# Patient Record
Sex: Male | Born: 1970 | Hispanic: No | Marital: Single | Smoking: Former smoker
Health system: Southern US, Community
[De-identification: ages and names within clinical notes are randomized; demographics above are authoritative.]

## PROBLEM LIST (undated history)

## (undated) ENCOUNTER — Ambulatory Visit (HOSPITAL_COMMUNITY): Payer: 59

## (undated) DIAGNOSIS — Z8489 Family history of other specified conditions: Secondary | ICD-10-CM

## (undated) DIAGNOSIS — Z87820 Personal history of traumatic brain injury: Secondary | ICD-10-CM

## (undated) DIAGNOSIS — G629 Polyneuropathy, unspecified: Secondary | ICD-10-CM

## (undated) DIAGNOSIS — M479 Spondylosis, unspecified: Secondary | ICD-10-CM

## (undated) DIAGNOSIS — I10 Essential (primary) hypertension: Secondary | ICD-10-CM

## (undated) DIAGNOSIS — M47819 Spondylosis without myelopathy or radiculopathy, site unspecified: Secondary | ICD-10-CM

## (undated) DIAGNOSIS — Z973 Presence of spectacles and contact lenses: Secondary | ICD-10-CM

## (undated) HISTORY — PX: NO PAST SURGERIES: SHX2092

## (undated) HISTORY — PX: KNEE ARTHROSCOPY W/ ORIF: SUR98

---
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
## 2009-05-17 ENCOUNTER — Emergency Department (HOSPITAL_COMMUNITY): Admission: EM | Admit: 2009-05-17 | Discharge: 2009-05-18 | Payer: Self-pay | Admitting: Emergency Medicine

## 2009-12-20 ENCOUNTER — Observation Stay (HOSPITAL_COMMUNITY): Admission: EM | Admit: 2009-12-20 | Discharge: 2009-12-21 | Payer: Self-pay | Admitting: Emergency Medicine

## 2009-12-20 ENCOUNTER — Ambulatory Visit: Payer: Self-pay | Admitting: Cardiology

## 2009-12-21 ENCOUNTER — Encounter (INDEPENDENT_AMBULATORY_CARE_PROVIDER_SITE_OTHER): Payer: Self-pay | Admitting: Emergency Medicine

## 2011-01-30 LAB — DIFFERENTIAL
Basophils Relative: 0 % (ref 0–1)
Eosinophils Relative: 1 % (ref 0–5)
Lymphs Abs: 2.9 10*3/uL (ref 0.7–4.0)
Monocytes Absolute: 0.8 10*3/uL (ref 0.1–1.0)
Monocytes Relative: 9 % (ref 3–12)

## 2011-01-30 LAB — ETHANOL: Alcohol, Ethyl (B): <5 mg/dL (ref 0–10)

## 2011-01-30 LAB — CK TOTAL AND CKMB (NOT AT ARMC)
CK, MB: 3.7 ng/mL (ref 0.3–4.0)
CK, MB: 3.8 ng/mL (ref 0.3–4.0)
CK, MB: 4.1 ng/mL — ABNORMAL HIGH (ref 0.3–4.0)
Relative Index: 1 (ref 0.0–2.5)
Total CK: 391 U/L — ABNORMAL HIGH (ref 7–232)

## 2011-01-30 LAB — COMPREHENSIVE METABOLIC PANEL
ALT: 21 U/L (ref 0–53)
AST: 27 U/L (ref 0–37)
Albumin: 4.1 g/dL (ref 3.5–5.2)
Alkaline Phosphatase: 64 U/L (ref 39–117)
Calcium: 8.9 mg/dL (ref 8.4–10.5)
Chloride: 105 mEq/L (ref 96–112)
GFR calc Af Amer: >60 mL/min (ref >60)
Total Protein: 7.3 g/dL (ref 6.0–8.3)

## 2011-01-30 LAB — RAPID URINE DRUG SCREEN, HOSP PERFORMED
Opiates: NOT DETECTED
Tetrahydrocannabinol: NOT DETECTED

## 2011-01-30 LAB — POCT CARDIAC MARKERS
CKMB, poc: 3.3 ng/mL (ref 1.0–8.0)
Myoglobin, poc: 57.2 ng/mL (ref 12–200)
Myoglobin, poc: 71.3 ng/mL (ref 12–200)

## 2011-01-30 LAB — D-DIMER, QUANTITATIVE: D-Dimer, Quant: <0.22 ug/mL-FEU (ref 0.00–0.48)

## 2011-01-30 LAB — CBC
HCT: 41 % (ref 39.0–52.0)
Hemoglobin: 14.1 g/dL (ref 13.0–17.0)
MCHC: 34.5 g/dL (ref 30.0–36.0)
MCV: 96.8 fL (ref 78.0–100.0)
RBC: 4.24 MIL/uL (ref 4.22–5.81)
RDW: 14.5 % (ref 11.5–15.5)

## 2011-01-30 LAB — TROPONIN I: Troponin I: 0.01 ng/mL (ref 0.00–0.06)

## 2011-01-30 LAB — LIPASE, BLOOD: Lipase: 51 U/L (ref 11–59)

## 2012-07-20 ENCOUNTER — Encounter (HOSPITAL_COMMUNITY): Payer: Self-pay | Admitting: Family Medicine

## 2012-07-20 ENCOUNTER — Emergency Department (HOSPITAL_COMMUNITY): Payer: Self-pay

## 2012-07-20 ENCOUNTER — Emergency Department (HOSPITAL_COMMUNITY)
Admission: EM | Admit: 2012-07-20 | Discharge: 2012-07-20 | Disposition: A | Discharge status: Home / Self Care | Payer: Self-pay | Attending: Emergency Medicine | Admitting: Emergency Medicine

## 2012-07-20 DIAGNOSIS — Y92009 Unspecified place in unspecified non-institutional (private) residence as the place of occurrence of the external cause: Secondary | ICD-10-CM | POA: Insufficient documentation

## 2012-07-20 DIAGNOSIS — Y93E1 Activity, personal bathing and showering: Secondary | ICD-10-CM | POA: Insufficient documentation

## 2012-07-20 DIAGNOSIS — S0990XA Unspecified injury of head, initial encounter: Secondary | ICD-10-CM

## 2012-07-20 DIAGNOSIS — S060XAA Concussion with loss of consciousness status unknown, initial encounter: Secondary | ICD-10-CM | POA: Insufficient documentation

## 2012-07-20 DIAGNOSIS — W2209XA Striking against other stationary object, initial encounter: Secondary | ICD-10-CM | POA: Insufficient documentation

## 2012-07-20 DIAGNOSIS — S060X9A Concussion with loss of consciousness of unspecified duration, initial encounter: Secondary | ICD-10-CM | POA: Insufficient documentation

## 2012-07-20 MED ORDER — HYDROCODONE-ACETAMINOPHEN 5-325 MG PO TABS: ORAL_TABLET | ORAL | Status: AC

## 2012-07-20 NOTE — ED Provider Notes (Signed)
History     CSN: [id number]  Arrival date & time 07/20/12  1145   First MD Initiated Contact with Patient 07/20/12 1522      Chief Complaint  Patient presents with  . Head Injury    (Consider location/radiation/quality/duration/timing/severity/associated sxs/prior treatment) HPI Comments: Patient is a 41 year old African American male, not on any blood thinning medications, who presents approximately 18 hours after striking his head on a shelf while in the shower. Patient struck the forward part of the crown of his head. He initially cleaned wound with peroxide. He denies loss of consciousness. No weakness in arms or legs, trouble walking/talking. Patient states he felt fine last night. Upon waking this morning patient had headache behind his left eye with associated photophobia, nausea and vomiting, dizziness described as a sensation of motion. He states the symptoms are gradually improving but not gone. Patient took aspirin without relief. No other treatments prior to arrival. Nothing makes symptoms better or worse. Onset was acute.  Patient is a 41 y.o. male presenting with head injury. The history is provided by the patient.  Head Injury  The incident occurred 12 to 24 hours ago. He came to the ER via walk-in. The injury mechanism was a direct blow. There was no loss of consciousness. The volume of blood lost was minimal. The quality of the pain is described as dull. The pain is moderate. The pain has been constant since the injury. Associated symptoms include vomiting. Pertinent negatives include no numbness, no blurred vision, patient does not experience disorientation, no weakness and no memory loss. He has tried NSAIDs for the symptoms. The treatment provided no relief.    History reviewed. No pertinent past medical history.  History reviewed. No pertinent past surgical history.  History reviewed. No pertinent family history.  History  Substance Use Topics  . Smoking status:  Never Smoker   . Smokeless tobacco: Not on file  . Alcohol Use: No      Review of Systems  Constitutional: Negative for fatigue.  HENT: Negative for neck pain.   Eyes: Positive for photophobia. Negative for blurred vision, pain and visual disturbance.  Respiratory: Negative for shortness of breath.   Cardiovascular: Negative for chest pain.  Gastrointestinal: Positive for nausea and vomiting. Negative for abdominal pain.  Musculoskeletal: Negative for back pain and gait problem.  Skin: Negative for wound.  Neurological: Positive for dizziness and headaches. Negative for facial asymmetry, weakness, light-headedness and numbness.  Psychiatric/Behavioral: Negative for memory loss, confusion and decreased concentration.    Allergies  Review of patient's allergies indicates no known allergies.  Home Medications   Current Outpatient Rx  Name Route Sig Dispense Refill  . ASPIRIN 325 MG PO TABS Oral Take 325 mg by mouth once as needed. For head ache      BP 135/91  Pulse 110  Temp 98.2 F (36.8 C) (Oral)  Resp 16  SpO2 95%  Physical Exam  Nursing note and vitals reviewed. Constitutional: He is oriented to person, place, and time. He appears well-developed and well-nourished.  HENT:  Head: Normocephalic and atraumatic. Head is without raccoon's eyes and without Battle's sign.  Right Ear: Tympanic membrane, external ear and ear canal normal. No hemotympanum.  Left Ear: Tympanic membrane, external ear and ear canal normal. No hemotympanum.  Nose: Nose normal.  Mouth/Throat: Oropharynx is clear and moist.  Eyes: Conjunctivae, EOM and lids are normal. Pupils are equal, round, and reactive to light.       No visible hyphema,  patient holds L eyelid shut due to 'light sensitivity'. Mild horizontal nystagmus.   Neck: Normal range of motion. Neck supple.  Cardiovascular: Normal rate and regular rhythm.   Pulmonary/Chest: Effort normal and breath sounds normal.  Abdominal: Soft. There  is no tenderness.  Musculoskeletal: Normal range of motion.       Cervical back: He exhibits normal range of motion, no tenderness and no bony tenderness.       Thoracic back: He exhibits no tenderness and no bony tenderness.       Lumbar back: He exhibits no tenderness and no bony tenderness.  Neurological: He is alert and oriented to person, place, and time. He has normal strength and normal reflexes. No cranial nerve deficit or sensory deficit. He displays a negative Romberg sign. Coordination and gait normal. GCS eye subscore is 4. GCS verbal subscore is 5. GCS motor subscore is 6.  Skin: Skin is warm and dry.  Psychiatric: He has a normal mood and affect.    ED Course  Procedures (including critical care time)  Labs Reviewed - No data to display Ct Head Wo Contrast  07/20/2012  *RADIOLOGY REPORT*  Clinical Data: Recent head trauma.  Headache.  Nausea, vomiting and dizziness.  CT HEAD WITHOUT CONTRAST  Technique:  Contiguous axial images were obtained from the base of the skull through the vertex without contrast.  Comparison: None.  Findings: The brain has a normal appearance without evidence of malformation, atrophy, old or acute infarction, mass lesion, hemorrhage, hydrocephalus or extra-axial collection.  No skull fracture.  Sinuses, middle ears and mastoids are clear.  IMPRESSION: Normal head CT.   Original Report Authenticated By: Thomasenia Sales, M.D.      1. Concussion   2. Head injury     3:46 PM Patient seen and examined. CT reviewed by myself. Exam performed.    Vital signs reviewed and are as follows: Filed Vitals:   07/20/12 1147  BP: 135/91  Pulse: 110  Temp: 98.2 F (36.8 C)  Resp: 16   3:50 PM D/w Dr. Manus Gunning. Will treat pain and d/c to home with neuro referral if symptoms persist. Patient to rest for next several days. CT reviewed by myself.   4:09 PM  BP 136/90  Pulse 78  Temp 98.5 F (36.9 C) (Oral)  Resp 18  SpO2 97%  Patient counseled on use of  narcotic pain medications. Counseled not to combine these medications with others containing tylenol. Urged not to drink alcohol, drive, or perform any other activities that requires focus while taking these medications. The patient verbalizes understanding and agrees with the plan.  Patient counseled to return with worsening headache, trouble walking, trouble talking, weakness in his arms or legs, facial droop, numbness or tingling in his arms or legs. Patient urged followup with neurology if not improved in one week. Counseled on possibility of post concussive syndrome. And patient appears well at time of discharge.  MDM  Patient with head injury, worsening symptoms this morning. CT is negative. Patient has signs and symptoms of concussion. Patient counseled on signs and symptoms to return. Neurology referral given if patient has persistent concussion symptoms.        Renne Crigler, [place] 07/20/12 [phone]

## 2012-07-20 NOTE — ED Provider Notes (Signed)
Medical screening examination/treatment/procedure(s) were conducted as a shared visit with non-physician practitioner(s) and myself.  I personally evaluated the patient during the encounter  Head injury in shower yesterday. No LOC.  No syncope. Now with headache, nausea, dizziness.  CN 2-12 intact, 5/5 strength throughout.  Glynn Octave, MD 07/20/12 1810

## 2012-07-20 NOTE — ED Notes (Signed)
Called pt to desk for VS.  No answer.

## 2012-07-20 NOTE — ED Notes (Signed)
Pt sts he hit head last night on his counter in the bathroom when getting out of shower. sts went to bed last night and woke up this am with N,V, HA and dizziness.

## 2015-12-25 DIAGNOSIS — M5430 Sciatica, unspecified side: Secondary | ICD-10-CM | POA: Insufficient documentation

## 2016-01-05 ENCOUNTER — Emergency Department (HOSPITAL_COMMUNITY): Payer: BLUE CROSS/BLUE SHIELD

## 2016-01-05 ENCOUNTER — Encounter (HOSPITAL_COMMUNITY): Payer: Self-pay | Admitting: Emergency Medicine

## 2016-01-05 ENCOUNTER — Emergency Department (HOSPITAL_COMMUNITY)
Admission: EM | Admit: 2016-01-05 | Discharge: 2016-01-05 | Disposition: A | Discharge status: Home / Self Care | Payer: BLUE CROSS/BLUE SHIELD | Attending: Emergency Medicine | Admitting: Emergency Medicine

## 2016-01-05 DIAGNOSIS — N12 Tubulo-interstitial nephritis, not specified as acute or chronic: Secondary | ICD-10-CM | POA: Diagnosis not present

## 2016-01-05 DIAGNOSIS — I10 Essential (primary) hypertension: Secondary | ICD-10-CM | POA: Insufficient documentation

## 2016-01-05 DIAGNOSIS — R109 Unspecified abdominal pain: Secondary | ICD-10-CM

## 2016-01-05 DIAGNOSIS — R1012 Left upper quadrant pain: Secondary | ICD-10-CM

## 2016-01-05 HISTORY — DX: Essential (primary) hypertension: I10

## 2016-01-05 LAB — BASIC METABOLIC PANEL
Anion gap: 13 (ref 5–15)
BUN: 11 mg/dL (ref 6–20)
CALCIUM: 10 mg/dL (ref 8.9–10.3)
CO2: 24 mmol/L (ref 22–32)
Chloride: 103 mmol/L (ref 101–111)
Creatinine, Ser: 1.28 mg/dL — ABNORMAL HIGH (ref 0.61–1.24)
GFR calc Af Amer: >60 mL/min (ref >60)
GLUCOSE: 111 mg/dL — AB (ref 65–99)
POTASSIUM: 4.5 mmol/L (ref 3.5–5.1)
Sodium: 140 mmol/L (ref 135–145)

## 2016-01-05 LAB — URINALYSIS, ROUTINE W REFLEX MICROSCOPIC
Bilirubin Urine: NEGATIVE
GLUCOSE, UA: NEGATIVE mg/dL
Hgb urine dipstick: NEGATIVE
Ketones, ur: NEGATIVE mg/dL
NITRITE: NEGATIVE
PH: 6 (ref 5.0–8.0)
Protein, ur: NEGATIVE mg/dL
SPECIFIC GRAVITY, URINE: 1.023 (ref 1.005–1.030)

## 2016-01-05 LAB — URINE MICROSCOPIC-ADD ON: RBC / HPF: NONE SEEN RBC/hpf (ref 0–5)

## 2016-01-05 LAB — HEPATIC FUNCTION PANEL
ALT: 37 U/L (ref 17–63)
AST: 36 U/L (ref 15–41)
Albumin: 4.7 g/dL (ref 3.5–5.0)
Alkaline Phosphatase: 69 U/L (ref 38–126)
Bilirubin, Direct: 0.1 mg/dL (ref 0.1–0.5)
Indirect Bilirubin: 0.7 mg/dL (ref 0.3–0.9)
Total Bilirubin: 0.8 mg/dL (ref 0.3–1.2)
Total Protein: 8 g/dL (ref 6.5–8.1)

## 2016-01-05 LAB — CBC
HEMATOCRIT: 44.2 % (ref 39.0–52.0)
Hemoglobin: 14.6 g/dL (ref 13.0–17.0)
MCH: 30.5 pg (ref 26.0–34.0)
MCHC: 33 g/dL (ref 30.0–36.0)
MCV: 92.5 fL (ref 78.0–100.0)
PLATELETS: 279 10*3/uL (ref 150–400)
RBC: 4.78 MIL/uL (ref 4.22–5.81)
RDW: 13.3 % (ref 11.5–15.5)
WBC: 8.4 10*3/uL (ref 4.0–10.5)

## 2016-01-05 LAB — LIPASE, BLOOD: Lipase: 45 U/L (ref 11–51)

## 2016-01-05 LAB — I-STAT TROPONIN, ED: Troponin i, poc: 0 ng/mL (ref 0.00–0.08)

## 2016-01-05 MED ORDER — MORPHINE SULFATE (PF) 4 MG/ML IV SOLN
4.0000 mg | Freq: Once | INTRAVENOUS | Status: DC
  Filled 2016-01-05: qty 1

## 2016-01-05 MED ORDER — IOHEXOL 300 MG/ML  SOLN
100.0000 mL | Freq: Once | INTRAMUSCULAR | Status: AC | PRN
  Administered 2016-01-05: 100 mL via INTRAVENOUS

## 2016-01-05 MED ORDER — DEXTROSE 5 % IV SOLN
1.0000 g | Freq: Once | INTRAVENOUS | Status: AC
  Administered 2016-01-05: 1 g via INTRAVENOUS
  Filled 2016-01-05: qty 10

## 2016-01-05 MED ORDER — MORPHINE SULFATE (PF) 4 MG/ML IV SOLN
4.0000 mg | Freq: Once | INTRAVENOUS | Status: AC
  Administered 2016-01-05: 4 mg via INTRAVENOUS
  Filled 2016-01-05: qty 1

## 2016-01-05 MED ORDER — SODIUM CHLORIDE 0.9 % IV BOLUS (SEPSIS)
1000.0000 mL | Freq: Once | INTRAVENOUS | Status: AC
  Administered 2016-01-05: 1000 mL via INTRAVENOUS

## 2016-01-05 MED ORDER — OXYCODONE-ACETAMINOPHEN 5-325 MG PO TABS
1.0000 | ORAL_TABLET | Freq: Once | ORAL | Status: AC
  Administered 2016-01-05: 1 via ORAL

## 2016-01-05 MED ORDER — OXYCODONE-ACETAMINOPHEN 5-325 MG PO TABS
ORAL_TABLET | ORAL | Status: AC
  Filled 2016-01-05: qty 1

## 2016-01-05 NOTE — ED Provider Notes (Signed)
CSN: [id number]     Arrival date & time 01/05/16  1211 History  By signing my name below, I, [name], attest that this documentation has been prepared under the direction and in the presence of Shawn Joy, PA-C. Electronically Signed: [name], ED Scribe. 01/05/2016. 7:01 PM.   Chief Complaint  Patient presents with  . Abdominal Pain   The history is provided by the patient. No language interpreter was used.   HPI Comments: Ernest Williams is a 45 y.o. male who presents to the Emergency Department complaining of intermittent left upper quadrant abdominal pain that radiates to left flank, and that has been present for 7 days but worsened today prompting ED evaluation. The pt states he believed the pain to be gas or GERD at onset but has been taking OTC antiacid medication without relief.  He describes this pain to be a spasm or like his chronic back pain, but to be unlike past muscle spasms experienced.The pt admits to a h/o chronic back pain for which he was prescribed robaxin, tramadol and a steroid for 4 days ago by his PCP. The robaxin has not provided relief of his current pain. Pt associates a decreased appetite, chills, diaphoresis, and an episode of very dark, almost black stool that occurred 1 day ago. His pain is exacerbated with deep breathing and on palpation. Pt denies hematochezia, fevers, pain with bowel movements, diarrhea, constipation, and urinary or GU symptoms. No cardiac or renal history.  Past Medical History  Diagnosis Date  . Hypertension    History reviewed. No pertinent past surgical history. No family history on file. Social History  Substance Use Topics  . Smoking status: Never Smoker   . Smokeless tobacco: None  . Alcohol Use: No    Review of Systems  Constitutional: Negative for fever.  Respiratory: Negative for cough and shortness of breath.   Gastrointestinal: Positive for abdominal pain. Negative for nausea, vomiting, diarrhea, constipation and  rectal pain.  Genitourinary: Negative for dysuria and hematuria.  All other systems reviewed and are negative.  Allergies  Review of patient's allergies indicates no known allergies.  Home Medications   Prior to Admission medications   Medication Sig Start Date End Date Taking? Authorizing Provider  aspirin 325 MG tablet Take 325 mg by mouth once as needed. For head ache    Historical Provider, MD   BP 118/85 mmHg  Pulse 88  Temp(Src) 98.3 F (36.8 C) (Oral)  Resp 16  Ht  (1.88 m)  Wt 266 lb 3.2 oz (120.748 kg)  BMI 34.16 kg/m2  SpO2 95% Physical Exam  Constitutional: He is oriented to person, place, and time. He appears well-developed and well-nourished. No distress.  HENT:  Head: Normocephalic.  Eyes: Conjunctivae are normal.  Neck: Normal range of motion. Neck supple.  Cardiovascular: Normal rate, regular rhythm, normal heart sounds and intact distal pulses.   No peripheral edema.   Pulmonary/Chest: Effort normal and breath sounds normal. No respiratory distress. He has no wheezes. He has no rales.  Abdominal: Soft. Bowel sounds are normal. There is tenderness in the left upper quadrant.  Musculoskeletal: Normal range of motion.  Tenderness to the left lower lumbar musculature.  Lymphadenopathy:    He has no cervical adenopathy.  Neurological: He is alert and oriented to person, place, and time. Coordination normal.  Skin: Skin is warm.  Psychiatric: He has a normal mood and affect. His behavior is normal.  Nursing note and vitals reviewed.   ED Course  Procedures  DIAGNOSTIC STUDIES: Oxygen Saturation is 95% on RA, adequate by my interpretation.    COORDINATION OF CARE: 5:11 PM Discussed treatment plan which includes to order symptomatic relief and IV fluids with pt. Discussed unremarkable cardiac workup obtained thus far. Will order Ct abdomen pelvis. Pt acknowledges and agrees to plan.   Labs Review Labs Reviewed  BASIC METABOLIC PANEL - Abnormal;  Notable for the following:    Glucose, Bld 111 (*)    Creatinine, Ser 1.28 (*)    All other components within normal limits  URINALYSIS, ROUTINE W REFLEX MICROSCOPIC (NOT AT [facility]) - Abnormal; Notable for the following:    APPearance CLOUDY (*)    Leukocytes, UA MODERATE (*)    All other components within normal limits  URINE MICROSCOPIC-ADD ON - Abnormal; Notable for the following:    Squamous Epithelial / LPF 0-5 (*)    Bacteria, UA FEW (*)    All other components within normal limits  URINE CULTURE  CBC  HEPATIC FUNCTION PANEL  LIPASE, BLOOD  I-STAT TROPOININ, ED  POC OCCULT BLOOD, ED  GC/CHLAMYDIA PROBE AMP ([place]) NOT AT [facility]    Imaging Review Dg Chest 2 View  01/05/2016  CLINICAL DATA:  Pt states left lower chest and left axillary rib pains x 2 weeks, no known injury or cough, no known htn or diabetes, no known heart disease, mk rt-r EXAM: CHEST - 2 VIEW COMPARISON:  12/20/2009 FINDINGS: Lungs are clear. Heart size and mediastinal contours are within normal limits. No effusion. Visualized skeletal structures are unremarkable. IMPRESSION: No acute cardiopulmonary disease. Electronically Signed   By: Corlis Leak M.D.   On: 01/05/2016 12:55   Ct Abdomen Pelvis W Contrast  01/05/2016  CLINICAL DATA:  Left upper quadrant pain EXAM: CT ABDOMEN AND PELVIS WITH CONTRAST TECHNIQUE: Multidetector CT imaging of the abdomen and pelvis was performed using the standard protocol following bolus administration of intravenous contrast. CONTRAST:  OMNIPAQUE IOHEXOL 300 MG/ML  SOLN COMPARISON:  None. FINDINGS: Lung bases are free of acute infiltrate or sizable effusion. The liver, gallbladder, spleen, adrenal glands and pancreas are within normal limits. The kidneys are well visualized bilaterally and demonstrate normal excretion of contrast. A small exophytic cyst is noted arising from the mid right kidney. The appendix is within normal limits. The bladder is partially distended. No free  pelvic fluid is seen. No pelvic mass lesion is noted. IMPRESSION: Small right renal cysts.  No acute abnormality is noted. Electronically Signed   By: Alcide Clever M.D.   On: 01/05/2016 18:53   I have personally reviewed and evaluated these images and lab results as part of my medical decision-making.   EKG Interpretation   Date/Time:  Friday January 05 2016 12:16:44 EST Ventricular Rate:  84 PR Interval:  134 QRS Duration: 90 QT Interval:  356 QTC Calculation: 420 R Axis:   11 Text Interpretation:  Normal sinus rhythm Minimal voltage criteria for  LVH, may be normal variant Borderline ECG Confirmed by Lincoln Brigham [phone])  on 01/05/2016 3:09:17 PM      MDM   Final diagnoses:  Flank pain  Left upper quadrant pain  Pyelonephritis    Ernest Williams presents with abdominal and lower rib pain for the past 7 days.  This patient's complaint and associated symptoms do not seem to fit any particular single diagnosis. Patient is nontoxic appearing, afebrile, not tachycardic, not tachypneic, maintains adequate SPO2 on room air, and is in no apparent distress. Patient has  no signs of sepsis or other serious or life-threatening condition. The patient's only lab abnormalities are moderate leukocytes on the UA and a minor increase in the patient's creatinine. Patient to be treated for possible pyelonephritis. IV Rocephin administered here in the ED. Patient voiced improvement after 2 courses of pain management. The patient was given instructions for home care as well as return precautions. Patient voices understanding of these instructions, accepts the plan, and is comfortable with discharge.  I personally performed the services described in this documentation, which was scribed in my presence. The recorded information has been reviewed and is accurate.   Anselm Pancoast, PA-C 01/05/16 2052  Tilden Fossa, MD 01/06/16 [phone]

## 2016-01-05 NOTE — ED Notes (Signed)
Chest pain started last Friday, in the L rib cage area. Pain is tender to palpation and deep breaths make it worse. Pt states the pain got worse today, was seen at [facility] for it and was given robaxin, steroid, tramadol, and antibiotics for it.

## 2016-01-05 NOTE — ED Notes (Signed)
Spoke with main lab, they will add on hepatic function panel and lipase.

## 2016-01-05 NOTE — Discharge Instructions (Signed)
You have been seen today for abdominal and flank pain. Your imaging showed no abnormalities. Your labs indicate that you may have an infection called pyelonephritis. You have been treated for this here in the ED. Follow up with PCP as soon as possible if symptoms do not resolve. Return to ED should symptoms worsen.   Emergency Department Resource Guide 1) Find a Doctor and Pay Out of Pocket Although you won't have to find out who is covered by your insurance plan, it is a good idea to ask around and get recommendations. You will then need to call the office and see if the doctor you have chosen will accept you as a new patient and what types of options they offer for patients who are self-pay. Some doctors offer discounts or will set up payment plans for their patients who do not have insurance, but you will need to ask so you aren't surprised when you get to your appointment.  2) Contact Your Local Health Department Not all health departments have doctors that can see patients for sick visits, but many do, so it is worth a call to see if yours does. If you don't know where your local health department is, you can check in your phone book. The CDC also has a tool to help you locate your state's health department, and many state websites also have listings of all of their local health departments.  3) Find a Walk-in Clinic If your illness is not likely to be very severe or complicated, you may want to try a walk in clinic. These are popping up all over the country in pharmacies, drugstores, and shopping centers. They're usually staffed by nurse practitioners or physician assistants that have been trained to treat common illnesses and complaints. They're usually fairly quick and inexpensive. However, if you have serious medical issues or chronic medical problems, these are probably not your best option.  No Primary Care Doctor: - Call Health Connect at  [phone] - they can help you locate a primary care  doctor that  accepts your insurance, provides certain services, etc. - Physician Referral Service- [phone]  Chronic Pain Problems: Organization         Address  Phone   Notes  [facility]  [phone] Patients need to be referred by their primary care doctor.   Medication Assistance: Organization         Address  Phone   Notes  [facility] Medication [facility] [address] [place] [place], [place] [address] [phone] --Must be a resident of [facility] -- Must have NO insurance coverage whatsoever (no Medicaid/ Medicare, etc.) -- The pt. MUST have a primary care doctor that directs their care regularly and follows them in the community   [facility]  [phone]   [employer]  [phone]    Agencies that provide inexpensive medical care: Organization         Address  Phone   Notes  [facility]  [phone]   [facility]    [phone]   [facility] [address] [place], [place] [address] [phone]   [facility] [address] [place], [place] [phone]   Planned Parenthood    [phone]   [facility]    [phone]   [facility]  [address], [place] Phone:  [phone], Fax:  [phone]  Hours of Operation:  9 am - 6 pm, M-F.  Also accepts Medicaid/Medicare and self-pay.  [facility] for [place], [address], [place] Phone: [phone], Fax: [phone]. Hours of Operation:  8:30 am - 5:30 pm, M-F.  Also accepts Medicaid and self-pay.  [facility] [place] [address] [place] Phone: [phone]   [place], [place], [place] [phone], Ext. [phone] Mondays & Thursdays: 7-9 AM.  First 15 patients are seen on a first come, first serve basis.    [place]  Providers:  Organization         Address  Phone   Notes  [facility] [address], [place] [phone] Also accepts self-pay patients.  [facility] [address] [place], [place]  [phone]   [place], [address], [place] [phone]   [facility] [address] [place] [phone]   Lucianne Lei [address], [place]   [phone] Only accepts [place] Access [place] patients after they have their name applied to their card.   Self-Pay (no insurance) in [facility]:  Organization         Address  Phone   Notes  Sickle Cell Patients, [facility] Internal Medicine [place] [phone]   [facility]gent Care [place] [phone]   [facility] [place]  [place], [place], [place] [phone]   [facility]/Dr. Osei-Bonsu  [address] [place] or [address], [place] [phone] Phone number for both [place] and [place] locations is the same.  [facility] [address] [place] [phone]   [facility] [address] [place] or [address] [phone]   [facility] [address], [place] [phone], phone; [phone], fax Sees patients 1st and 3rd Saturday of every month.  Must not qualify for public or private insurance (i.e. Medicaid, Medicare, [place] Health Choice, Veterans' Benefits)  Household income should be no more than 200% of the poverty level The clinic cannot treat you if you are pregnant or think you are pregnant  Sexually transmitted diseases are not treated at the clinic.    Dental Care: Organization         Address  Phone  Notes  [facility] Department of [facility] [place] [phone] Accepts children up to age 74 who are enrolled in [place] or [place]; pregnant women with a Medicaid card; and children who have applied for Medicaid or [place] Health Choice, but were declined, whose parents can pay a reduced fee at time of service.  [facility]  [address], [place] [phone] Accepts children up to age 32 who are enrolled in [place] or [place]; pregnant women with a Medicaid card; and children who have applied for Medicaid or [place] Health Choice, but were declined, whose parents can pay a reduced fee at time of service.  [facility]  [place] [phone] Patients are seen by appointment only. Walk-ins are not accepted. [place] will  see patients 5 years of age and older. Monday - Tuesday (8am-5pm) Most Wednesdays (8:30-5pm) $30 per visit, cash only  [facility] Access [facility]  [address], [facility] [phone] Patients are seen by appointment only. Walk-ins are not accepted. [place] will see patients 73 years of age and older. One Wednesday Evening (Monthly: Volunteer Based).  $30 per visit, cash only  [place]  [phone] for adults; Children under age 58, call Graduate Pediatric Dentistry at [phone]. Children aged 68-14, please call [phone] to request a pediatric application.  Dental services are provided in all areas of dental care including fillings, crowns and bridges, complete and partial dentures, implants, gum treatment, root canals, and extractions. Preventive care is also provided. Treatment is provided to both adults and children. Patients are selected via a lottery and there is often a waiting list.   [facility] [address] [place]  [phone] [email or web address]   [facility]  [address] [place], [place] [phone], Ext. [phone] Second and Fourth Thursday of each month, opens at 6:30 AM; Clinic ends at 9 AM.  Patients are seen on a first-come first-served basis, and a limited number are seen during each clinic.   [facility]  [address] Hillard Danker [place], [place] [phone]   Eligibility Requirements You must have lived in [place], [place], or [place] counties for at least the last three months.   You cannot be eligible for state or federal sponsored [employer], including [employer], [place], or [employer].   You generally cannot be eligible for healthcare insurance through your employer.    How to apply: Eligibility screenings are held every Tuesday and Wednesday afternoon from 1:00 pm until 4:00 pm. You do not need an appointment for the interview!  [facility] [address] [place], [place]   [place]  [place] Department  [place]  [phone]    Behavioral Health Resources in the Community: Intensive Outpatient Programs Organization         Address  Phone  Notes  [place]. [address] [place], [place] [phone]   [facility] Outpatient [address] [place], [place]   ADS: [facility] [address] [place], [place]   [place] [address]  [place], [place] or [phone]   Substance Abuse Resources Organization         Address  Phone  Notes  Alcohol and Drug Services  [phone]   [place]  [phone]   [facility] [place]   Chinita Pester  [phone]   Residential & Outpatient Substance Abuse Program  [phone]   Psychological Services Organization         Address  Phone  Notes  [facility]  [place]  [phone]   [place] [address] [place] or [phone]    Mobile Crisis Teams Organization         Address  Phone  Notes  [facility], Mobile Crisis Care Unit  [phone]   [facility]  [address] [place], [place]   [facility] [address] [place] [phone]    Self-Help/Support Groups Organization  Address  Phone             Notes  [place]. of [place] - variety of support groups  [place] Call for more information  Narcotics Anonymous (NA), [facility] [address], [employer] [place]  2 meetings at this location   Special educational needs teacher         Address  Phone  Notes  [facility] Residential Treatment [place],    [place]  [phone]   [facility]  [address], [place] [id number], [place], [place]   [place], [place] [phone] Admissions: 8am-3pm M-F  Incentives Substance [place] [address],    [place], [place] [phone]   [facility] [address] [place], [place], [place]   [facility] [address],  [place], [place]   [facility] Programs - Intensive Outpatient [address] Kristeen [address], [place], [place]   [facility] ([place]  [place], [place] [phone] or [phone]   [facility] Services (RTS) [address] [place], [place] Accepts Medicaid  [facility] [place] [address]  [place] [phone] Substance Abuse/Addiction Treatment   [facility] Organization         Address  Phone  Notes  [facility]  [phone]   Domenic Schwab, PhD [address] Arlis Porta [place], [place]   [phone] or [phone]    [place], [place] [phone]   [facility] [address], [place], [place] [phone] Insurance/Medicaid/sponsorship through [facility] and Families [address] [place]                                    [place], [place] [phone] [place] [address], [place] [phone]    Dr. Adele Schilder  [phone]   [facility] Dept. 1) [address], [place] 2) [place] 3)  [place] [address], [place] [phone]  [phone]   [place] [phone] or [phone] (After Hours)

## 2016-01-05 NOTE — ED Notes (Signed)
Pt placed in gown.

## 2016-01-07 LAB — URINE CULTURE: Culture: NO GROWTH

## 2016-01-08 LAB — GC/CHLAMYDIA PROBE AMP (~~LOC~~) NOT AT ARMC
Chlamydia: NEGATIVE
NEISSERIA GONORRHEA: NEGATIVE

## 2017-06-26 ENCOUNTER — Emergency Department (HOSPITAL_COMMUNITY)
Admission: EM | Admit: 2017-06-26 | Discharge: 2017-06-26 | Disposition: A | Discharge status: Home / Self Care | Payer: BLUE CROSS/BLUE SHIELD | Attending: Emergency Medicine | Admitting: Emergency Medicine

## 2017-06-26 ENCOUNTER — Emergency Department (HOSPITAL_COMMUNITY): Payer: BLUE CROSS/BLUE SHIELD

## 2017-06-26 ENCOUNTER — Encounter (HOSPITAL_COMMUNITY): Payer: Self-pay | Admitting: Emergency Medicine

## 2017-06-26 DIAGNOSIS — Y9389 Activity, other specified: Secondary | ICD-10-CM | POA: Diagnosis not present

## 2017-06-26 DIAGNOSIS — Y999 Unspecified external cause status: Secondary | ICD-10-CM | POA: Insufficient documentation

## 2017-06-26 DIAGNOSIS — X509XXA Other and unspecified overexertion or strenuous movements or postures, initial encounter: Secondary | ICD-10-CM | POA: Diagnosis not present

## 2017-06-26 DIAGNOSIS — S39012A Strain of muscle, fascia and tendon of lower back, initial encounter: Secondary | ICD-10-CM

## 2017-06-26 DIAGNOSIS — M545 Low back pain: Secondary | ICD-10-CM

## 2017-06-26 DIAGNOSIS — Z7982 Long term (current) use of aspirin: Secondary | ICD-10-CM | POA: Insufficient documentation

## 2017-06-26 DIAGNOSIS — I10 Essential (primary) hypertension: Secondary | ICD-10-CM | POA: Diagnosis not present

## 2017-06-26 DIAGNOSIS — Y929 Unspecified place or not applicable: Secondary | ICD-10-CM | POA: Insufficient documentation

## 2017-06-26 DIAGNOSIS — S3992XA Unspecified injury of lower back, initial encounter: Secondary | ICD-10-CM | POA: Diagnosis present

## 2017-06-26 MED ORDER — PREDNISONE 20 MG PO TABS
60.0000 mg | ORAL_TABLET | Freq: Once | ORAL | Status: AC
  Administered 2017-06-26: 60 mg via ORAL
  Filled 2017-06-26: qty 3

## 2017-06-26 MED ORDER — ONDANSETRON 4 MG PO TBDP
4.0000 mg | ORAL_TABLET | Freq: Once | ORAL | Status: AC
  Administered 2017-06-26: 4 mg via ORAL
  Filled 2017-06-26: qty 1

## 2017-06-26 MED ORDER — PREDNISONE 10 MG PO TABS: 20.0000 mg | ORAL_TABLET | Freq: Two times a day (BID) | ORAL | 0 refills | Status: AC

## 2017-06-26 MED ORDER — NAPROXEN 500 MG PO TABS: 500.0000 mg | ORAL_TABLET | Freq: Two times a day (BID) | ORAL | 0 refills | Status: DC

## 2017-06-26 MED ORDER — METHOCARBAMOL 500 MG PO TABS: 500.0000 mg | ORAL_TABLET | Freq: Two times a day (BID) | ORAL | 0 refills | Status: AC

## 2017-06-26 MED ORDER — MORPHINE SULFATE (PF) 4 MG/ML IV SOLN
6.0000 mg | Freq: Once | INTRAVENOUS | Status: AC
  Administered 2017-06-26: 6 mg via INTRAMUSCULAR
  Filled 2017-06-26: qty 2

## 2017-06-26 NOTE — ED Triage Notes (Signed)
Pt sts lower back pain with radiation to legs since Tuesday when at work

## 2017-06-26 NOTE — ED Notes (Signed)
Co sudden onset LBP with radiation to both legs and weakness to legs. States has hx of "sciatica...but not like this!"

## 2017-06-26 NOTE — Discharge Instructions (Signed)
Your xray's showed no signs of fracture, this is likely a muscle strain. Take Naprosyn twice daily as needed, do not combine with any pain medication other than tylenol. You may take Robaxin twice daily as needed for muscle spasms. Continue steroids for 5 days to improve inflammation.  It is very important that you return to the ED if you experience any of these symptoms:  if you are no longer able to go to the bathroom, or lose control of you bowels or bladder, if you have numbness throughout both legs, or are unable to walk.

## 2017-06-26 NOTE — ED Provider Notes (Signed)
[facility] Provider Note   CSN: [id number] Arrival date & time: 06/26/17  1428     History   Chief Complaint Chief Complaint  Patient presents with  . Back Pain    HPI Ernest Williams is a 46 y.o. male.  Ernest Williams is a 46 y.o. Male with a past medical history of hypertension and sciatica, presents for evaluation of lower back pain with pain radiating down both legs. Patient reports he has had a history of sciatica on left side, but states this is different, and more painful. Patient reports pain began Tuesday at work when lifting the hood of a car. He reports he felt a "pop" and then a shooting sensation down both legs. He reports as he was going to walk inside he fell, but did not hit his head or sustain additional injury. He reports his legs felt weak with a tingling sensation. He went home from work and tried to rest in bed yesterday put reports pain has not improved. He states his legs feel weak like he cannot lift them up due to pain.  Patient reports he has taken several doses of tylenol and motrin at home with no improvement. Patient reports he tried to go to work today and was able to ambulate. Patient reports he has been able to move his bowel and bladder and denies any episodes of incontinence. Denies saddle anesthesia. No history of IV drug abuse or hx of Cancer. He denies urinary symptoms.       Past Medical History:  Diagnosis Date  . Hypertension     There are no active problems to display for this patient.   History reviewed. No pertinent surgical history.     Home Medications    Prior to Admission medications   Medication Sig Start Date End Date Taking? Authorizing Provider  aspirin 325 MG tablet Take 325 mg by mouth once as needed. For head ache    [provider]    Family History History reviewed. No pertinent family history.  Social History Social History  Substance Use Topics  . Smoking status: Never Smoker  . Smokeless  tobacco: Not on file  . Alcohol use No     Allergies   Patient has no known allergies.   Review of Systems Review of Systems  Constitutional: Negative for chills and fever.  Eyes: Negative for visual disturbance.  Respiratory: Negative for chest tightness and shortness of breath.   Cardiovascular: Negative for chest pain and palpitations.  Gastrointestinal: Negative for abdominal distention, abdominal pain, constipation and diarrhea.  Genitourinary: Negative for difficulty urinating, dysuria and hematuria.  Musculoskeletal: Positive for back pain. Negative for gait problem.  Skin: Negative for rash and wound.  Neurological: Positive for weakness. Negative for dizziness and numbness.  All other systems reviewed and are negative.    Physical Exam Updated Vital Signs BP (!) 165/91 (BP Location: Right Arm)   Pulse 82   Temp 98 F (36.7 C) (Oral)   Resp 18   SpO2 97%   Physical Exam  Constitutional: He is oriented to person, place, and time. He appears well-developed and well-nourished. No distress.  HENT:  Head: Normocephalic and atraumatic.  Eyes: Pupils are equal, round, and reactive to light. Right eye exhibits no discharge. Left eye exhibits no discharge.  Cardiovascular: Normal rate, regular rhythm, normal heart sounds and intact distal pulses.   Bilateral dorsalis pedis and posterior tibials pulses are intact.   Pulmonary/Chest: Effort normal and breath sounds normal. No respiratory  distress.  Abdominal: Soft. Bowel sounds are normal. He exhibits no distension. There is no tenderness. There is no guarding.  Musculoskeletal:       Lumbar back: He exhibits decreased range of motion, pain and spasm. He exhibits no deformity.  No midline back TTP. No back erythema, deformity or ecchymosis.   Neurological: He is alert and oriented to person, place, and time. He displays normal reflexes. No sensory deficit. Coordination normal.  Decreased strength in bilateral lower  extremities, strength exam limited by pain  Skin: Skin is warm and dry. Capillary refill takes less than 2 seconds. No rash noted. He is not diaphoretic. No erythema. No pallor.  Psychiatric: He has a normal mood and affect. His behavior is normal.  Nursing note and vitals reviewed.    ED Treatments / Results  Labs (all labs ordered are listed, but only abnormal results are displayed) Labs Reviewed - No data to display  EKG  EKG Interpretation None       Radiology No results found.  Procedures Procedures (including critical care time)  Medications Ordered in ED Medications  ondansetron (ZOFRAN-ODT) disintegrating tablet 4 mg (not administered)  morphine 4 MG/ML injection 6 mg (not administered)  predniSONE (DELTASONE) tablet 60 mg (not administered)     Initial Impression / Assessment and Plan / ED Course  I have reviewed the triage vital signs and the nursing notes.  Pertinent labs & imaging results that were available during my care of the patient were reviewed by me and considered in my medical decision making (see chart for details).  3:15 PM Patient seen and evaluated, VSS presenting for evaluation of low back pain with pain radiating down both legs with some weakness- suspect due to pain. Denies numbness, bowel or bladder incontinence or saddle anesthesia, unlikely cauda equina syndrome. Will obtain plain film of lumbar spine. Morphine, Toradol and prednisone ordered for pain management.  5:15 PM Lumbar XR shows mild degenerative disk disease with no acute abnormalities. Discussed results with patient. Repeated exam after pain meds given, patient exhibited improved strength and mobility and reports improvement in pain. Most likely a muscle strain, will provide pain medication, muscle relaxer and short course of steroids. Discussed discharge with follow-up at orthopedics clinic. Strict return precautions provided  Final Clinical Impressions(s) / ED Diagnoses   Final  diagnoses:  Low back pain, unspecified back pain laterality, unspecified chronicity, with sciatica presence unspecified  Strain of lumbar region, initial encounter    New Prescriptions Discharge Medication List as of 06/26/2017  5:07 PM    START taking these medications   Details  methocarbamol (ROBAXIN) 500 MG tablet Take 1 tablet (500 mg total) by mouth 2 (two) times daily., Starting Thu 06/26/2017, Until Tue 07/01/2017, Print    naproxen (NAPROSYN) 500 MG tablet Take 1 tablet (500 mg total) by mouth 2 (two) times daily., Starting Thu 06/26/2017, Print    predniSONE (DELTASONE) 10 MG tablet Take 2 tablets (20 mg total) by mouth 2 (two) times daily with a meal., Starting Thu 06/26/2017, Until Tue 07/01/2017, Print         Dartha LodgeFord,  N, PA-C 06/26/17 2146    Little, Ambrose Finlandachel Morgan, MD 06/28/17 2121

## 2017-08-27 ENCOUNTER — Ambulatory Visit (HOSPITAL_COMMUNITY)
Admission: EM | Admit: 2017-08-27 | Discharge: 2017-08-27 | Disposition: A | Discharge status: Home / Self Care | Payer: BLUE CROSS/BLUE SHIELD | Attending: Family Medicine | Admitting: Family Medicine

## 2017-08-27 ENCOUNTER — Encounter (HOSPITAL_COMMUNITY): Payer: Self-pay | Admitting: Emergency Medicine

## 2017-08-27 DIAGNOSIS — M5432 Sciatica, left side: Secondary | ICD-10-CM | POA: Diagnosis not present

## 2017-08-27 DIAGNOSIS — W268XXA Contact with other sharp object(s), not elsewhere classified, initial encounter: Secondary | ICD-10-CM | POA: Diagnosis not present

## 2017-08-27 DIAGNOSIS — S61411A Laceration without foreign body of right hand, initial encounter: Secondary | ICD-10-CM

## 2017-08-27 MED ORDER — TRAMADOL HCL 50 MG PO TABS: 50.0000 mg | ORAL_TABLET | Freq: Four times a day (QID) | ORAL | 0 refills | Status: DC | PRN

## 2017-08-27 MED ORDER — CYCLOBENZAPRINE HCL 10 MG PO TABS: 10.0000 mg | ORAL_TABLET | Freq: Three times a day (TID) | ORAL | 0 refills | Status: DC

## 2017-08-27 NOTE — ED Triage Notes (Signed)
Pt states he cut the top of R hand with some metal shelving. Also c/o sciatica pain on the left side of his leg. Bleeding controlled, pt tetanus up to date.

## 2017-08-27 NOTE — ED Notes (Signed)
Dressing applied to right hand laceration

## 2017-08-27 NOTE — ED Provider Notes (Signed)
  [facility]   [id number] 08/27/17 Arrival Time: 1004  ASSESSMENT & PLAN:  1. Laceration of right hand without foreign body, initial encounter   2. Sciatica of left side     Meds ordered this encounter  Medications  . traMADol (ULTRAM) 50 MG tablet    Sig: Take 1 tablet (50 mg total) by mouth every 6 (six) hours as needed.    Dispense:  15 tablet    Refill:  0  . cyclobenzaprine (FLEXERIL) 10 MG tablet    Sig: Take 1 tablet (10 mg total) by mouth 3 (three) times daily.    Dispense:  20 tablet    Refill:  0   May f/u as needed. Work note given. Reviewed expectations re: course of current medical issues. Questions answered. Outlined signs and symptoms indicating need for more acute intervention. Patient verbalized understanding. After Visit Summary given.   SUBJECTIVE:  Phoebe Perchhillip Kross is a 46 y.o. male who presents with complaints of:  1) R hand laceration. Small. This morning. Cut on metal shelving. Back of R hand. Minimal bleeding, now controlled. Td UTD. No extremity sensation changes or weakness. Minimal discomfort. Cleaned under faucet at work.  2) Sciatica. Left. Recurring problem. Recent mild flare-up. OTC NSAIDs without much relief. Ambulatory. No extremity sensation changes or weakness. Certain movements exacerbate. Has used Flexeril/Ultram in past with good results. Normal bowel/bladder habits.  ROS: As per HPI.   OBJECTIVE:  Vitals:   08/27/17 1040  BP: (!) 156/104  Pulse: 84  Resp: 16  Temp: 98.6 F (37 C)  TempSrc: Oral  SpO2: 97%    General appearance: alert; no distress Lungs: clear to auscultation bilaterally Heart: regular rate and rhythm Back: no CVA tenderness SLR: mild pain in LLE with raise Extremities: R hand with <1cm superficial laceration dorsally; no active bleeding Skin: warm and dry Neurologic: normal gait; normal symmetric reflexes Psychological: alert and cooperative; normal mood and affect  No Known  Allergies  Past Medical History:  Diagnosis Date  . Hypertension    Social History   Social History  . Marital status: Single    Spouse name: N/A  . Number of children: N/A  . Years of education: N/A   Occupational History  . Not on file.   Social History Main Topics  . Smoking status: Never Smoker  . Smokeless tobacco: Not on file  . Alcohol use No  . Drug use: Unknown  . Sexual activity: Not on file   Other Topics Concern  . Not on file   Social History Narrative  . No narrative on file     Mardella LaymanHagler, , MD 08/27/17 1123

## 2017-08-27 NOTE — ED Notes (Signed)
Laceration washed with soap and water

## 2018-01-22 ENCOUNTER — Other Ambulatory Visit: Payer: Self-pay

## 2018-01-22 ENCOUNTER — Encounter (HOSPITAL_COMMUNITY): Payer: Self-pay | Admitting: Emergency Medicine

## 2018-01-22 ENCOUNTER — Emergency Department (HOSPITAL_COMMUNITY)
Admission: EM | Admit: 2018-01-22 | Discharge: 2018-01-22 | Disposition: A | Discharge status: Home / Self Care | Payer: BLUE CROSS/BLUE SHIELD | Attending: Emergency Medicine | Admitting: Emergency Medicine

## 2018-01-22 ENCOUNTER — Telehealth: Payer: Self-pay | Admitting: *Deleted

## 2018-01-22 ENCOUNTER — Emergency Department (HOSPITAL_COMMUNITY): Payer: BLUE CROSS/BLUE SHIELD

## 2018-01-22 DIAGNOSIS — Z7982 Long term (current) use of aspirin: Secondary | ICD-10-CM | POA: Insufficient documentation

## 2018-01-22 DIAGNOSIS — Z87891 Personal history of nicotine dependence: Secondary | ICD-10-CM | POA: Insufficient documentation

## 2018-01-22 DIAGNOSIS — I10 Essential (primary) hypertension: Secondary | ICD-10-CM | POA: Insufficient documentation

## 2018-01-22 DIAGNOSIS — Z79899 Other long term (current) drug therapy: Secondary | ICD-10-CM | POA: Diagnosis not present

## 2018-01-22 DIAGNOSIS — M25512 Pain in left shoulder: Secondary | ICD-10-CM | POA: Insufficient documentation

## 2018-01-22 LAB — CBC
HCT: 43.5 % (ref 39.0–52.0)
Hemoglobin: 14.8 g/dL (ref 13.0–17.0)
MCH: 31.5 pg (ref 26.0–34.0)
MCHC: 34 g/dL (ref 30.0–36.0)
MCV: 92.6 fL (ref 78.0–100.0)
Platelets: 288 10*3/uL (ref 150–400)
RBC: 4.7 MIL/uL (ref 4.22–5.81)
RDW: 13.5 % (ref 11.5–15.5)
WBC: 6.7 10*3/uL (ref 4.0–10.5)

## 2018-01-22 LAB — BASIC METABOLIC PANEL
Anion gap: 13 (ref 5–15)
BUN: 11 mg/dL (ref 6–20)
CO2: 21 mmol/L — ABNORMAL LOW (ref 22–32)
Calcium: 9.3 mg/dL (ref 8.9–10.3)
Chloride: 102 mmol/L (ref 101–111)
Creatinine, Ser: 1 mg/dL (ref 0.61–1.24)
GFR calc Af Amer: >60 mL/min (ref >60)
GFR calc non Af Amer: >60 mL/min (ref >60)
Glucose, Bld: 129 mg/dL — ABNORMAL HIGH (ref 65–99)
Potassium: 4.2 mmol/L (ref 3.5–5.1)
Sodium: 136 mmol/L (ref 135–145)

## 2018-01-22 LAB — I-STAT TROPONIN, ED: Troponin i, poc: 0 ng/mL (ref 0.00–0.08)

## 2018-01-22 MED ORDER — HYDROCODONE-ACETAMINOPHEN 5-325 MG PO TABS: 2.0000 | ORAL_TABLET | ORAL | 0 refills | Status: DC | PRN

## 2018-01-22 MED ORDER — HYDROMORPHONE HCL 1 MG/ML IJ SOLN
1.0000 mg | Freq: Once | INTRAMUSCULAR | Status: DC
  Filled 2018-01-22: qty 1

## 2018-01-22 MED ORDER — IBUPROFEN 400 MG PO TABS
600.0000 mg | ORAL_TABLET | Freq: Once | ORAL | Status: AC
  Administered 2018-01-22: 11:00:00 600 mg via ORAL
  Filled 2018-01-22: qty 1

## 2018-01-22 MED ORDER — DEXAMETHASONE 4 MG PO TABS
12.0000 mg | ORAL_TABLET | Freq: Once | ORAL | Status: AC
  Administered 2018-01-22: 12 mg via ORAL
  Filled 2018-01-22: qty 3

## 2018-01-22 MED ORDER — NAPROXEN 375 MG PO TABS: 375.0000 mg | ORAL_TABLET | Freq: Two times a day (BID) | ORAL | 0 refills | Status: DC

## 2018-01-22 MED ORDER — DEXAMETHASONE 4 MG PO TABS: 4.0000 mg | ORAL_TABLET | Freq: Two times a day (BID) | ORAL | 0 refills | Status: DC

## 2018-01-22 NOTE — Telephone Encounter (Signed)
Pharmacy called related to Rx: Norco 2 tablet every 4 hours PRN pain .Marland Kitchen.Marland Kitchen.EDCM clarified with EDP (Kohut) to change Rx to: 1 tablet every 4 hours for pain.

## 2018-01-22 NOTE — ED Notes (Signed)
Dr kohut at bedside 

## 2018-01-22 NOTE — ED Triage Notes (Signed)
Pt reports intermittent CP X1 mo worse within past week. States L sided with radiation into L neck/jaw. Also reports SOB. Pt appears to be in discomfort in triage.

## 2018-01-23 NOTE — ED Provider Notes (Signed)
[facility] Provider Note   CSN: [id number] Arrival date & time: 01/22/18  [id number]     History   Chief Complaint Chief Complaint  Patient presents with  . Chest Pain    HPI Ernest Williams is a 47 y.o. male.  HPI   47 year old male with left neck/left shoulder and left upper chest pain.  Symptom onset several weeks ago.  Initially intermittent.  Has progressively worsened and now fairly constant.  Worse with movement.  Radiates into his left upper arm.  No numbness or tingling.  Pain is also worse with certain movements of his neck.  Past Medical History:  Diagnosis Date  . Hypertension     There are no active problems to display for this patient.   History reviewed. No pertinent surgical history.     Home Medications    Prior to Admission medications   Medication Sig Start Date End Date Taking? Authorizing Provider  aspirin 325 MG tablet Take 325 mg by mouth once as needed. For head ache   Yes [provider]  cyclobenzaprine (FLEXERIL) 10 MG tablet Take 1 tablet (10 mg total) by mouth 3 (three) times daily. Patient not taking: Reported on 01/22/2018 08/27/17   Mardella LaymanHagler, Brian, MD  dexamethasone (DECADRON) 4 MG tablet Take 1 tablet (4 mg total) by mouth 2 (two) times daily. 01/22/18   Raeford RazorKohut, , MD  HYDROcodone-acetaminophen (NORCO/VICODIN) 5-325 MG tablet Take 2 tablets by mouth every 4 (four) hours as needed. 01/22/18   Raeford RazorKohut, , MD  ibuprofen (ADVIL,MOTRIN) 800 MG tablet Take 800 mg by mouth every 8 (eight) hours as needed.    [provider]  naproxen (NAPROSYN) 375 MG tablet Take 1 tablet (375 mg total) by mouth 2 (two) times daily. 01/22/18   Raeford RazorKohut, , MD  naproxen (NAPROSYN) 500 MG tablet Take 1 tablet (500 mg total) by mouth 2 (two) times daily. Patient not taking: Reported on 01/22/2018 06/26/17   Dartha LodgeFord, Kelsey N, PA-C  PENICILLIN V POTASSIUM PO Take by mouth.    [provider]  traMADol  (ULTRAM) 50 MG tablet Take 1 tablet (50 mg total) by mouth every 6 (six) hours as needed. Patient not taking: Reported on 01/22/2018 08/27/17   Mardella LaymanHagler, Brian, MD    Family History No family history on file.  Social History Social History   Tobacco Use  . Smoking status: Former Smoker    Types: Cigarettes    Last attempt to quit: 2017    Years since quitting: 2.2  . Smokeless tobacco: Never Used  Substance Use Topics  . Alcohol use: Yes    Comment: Socially  . Drug use: Yes    Types: Marijuana     Allergies   Patient has no known allergies.   Review of Systems Review of Systems  All systems reviewed and negative, other than as noted in HPI.  Physical Exam Updated Vital Signs BP (!) 117/98   Pulse 73   Temp 98 F (36.7 C) (Oral)   Resp (!) 21   Ht 6\' 2"  (1.88 m)   Wt 117.9 kg (260 lb)   SpO2 97%   BMI 33.38 kg/m   Physical Exam  Constitutional: He appears well-developed and well-nourished. No distress.  HENT:  Head: Normocephalic and atraumatic.  Eyes: Conjunctivae are normal. Right eye exhibits no discharge. Left eye exhibits no discharge.  Neck: Neck supple.  Cardiovascular: Normal rate, regular rhythm and normal heart sounds. Exam reveals no gallop and no friction  rub.  No murmur heard. Pulmonary/Chest: Effort normal and breath sounds normal. No respiratory distress.  Abdominal: Soft. He exhibits no distension. There is no tenderness.  Musculoskeletal: He exhibits no edema or tenderness.  Tenderness to palpation along the left lateral neck and upper trapezius.  No overlying skin is normal to inspection.  He has increased pain with neck flexion and rotation.  He also reports increased pain with abduction of the left shoulder.  Neurovascular intact distally.  Neurological: He is alert.  Skin: Skin is warm and dry.  Psychiatric: He has a normal mood and affect. His behavior is normal. Thought content normal.  Nursing note and vitals reviewed.    ED  Treatments / Results  Labs (all labs ordered are listed, but only abnormal results are displayed) Labs Reviewed  BASIC METABOLIC PANEL - Abnormal; Notable for the following components:      Result Value   CO2 21 (*)    Glucose, Bld 129 (*)    All other components within normal limits  CBC  I-STAT TROPONIN, ED    EKG  EKG Interpretation  Date/Time:  Thursday January 22 2018 06:36:54 EDT Ventricular Rate:  84 PR Interval:  128 QRS Duration: 88 QT Interval:  366 QTC Calculation: 432 R Axis:   43 Text Interpretation:  Normal sinus rhythm Normal ECG When compared with ECG of 01/05/2016, No significant change was found Confirmed by Dione Booze ([id number]) on 01/22/2018 7:42:12 AM       Radiology Dg Chest 2 View  Result Date: 01/22/2018 CLINICAL DATA:  Intermittent chest pain 1 month. EXAM: CHEST - 2 VIEW COMPARISON:  01/05/2016. FINDINGS: The heart size and mediastinal contours are within normal limits. Both lungs are clear. The visualized skeletal structures are unremarkable. IMPRESSION: No active cardiopulmonary disease.  Stable chest. Electronically Signed   By: Elsie Stain M.D.   On: 01/22/2018 07:36    Procedures Procedures (including critical care time)  Medications Ordered in ED Medications  dexamethasone (DECADRON) tablet 12 mg (12 mg Oral Given 01/22/18 1121)  ibuprofen (ADVIL,MOTRIN) tablet 600 mg (600 mg Oral Given 01/22/18 1121)     Initial Impression / Assessment and Plan / ED Course  I have reviewed the triage vital signs and the nursing notes.  Pertinent labs & imaging results that were available during my care of the patient were reviewed by me and considered in my medical decision making (see chart for details).     47 year old male with left neck/shoulder/upper chest pain.  I suspect that this may be a cervical radiculopathy.  Very atypical for ACS.  Doubt PE, dissection or other emergent process.  Plan symptomatic treatment.  Return precautions were  discussed.  Final Clinical Impressions(s) / ED Diagnoses   Final diagnoses:  Acute pain of left shoulder    ED Discharge Orders        Ordered    dexamethasone (DECADRON) 4 MG tablet  2 times daily     01/22/18 1051    HYDROcodone-acetaminophen (NORCO/VICODIN) 5-325 MG tablet  Every 4 hours PRN     01/22/18 1051    naproxen (NAPROSYN) 375 MG tablet  2 times daily     01/22/18 1051       Raeford Razor, MD 01/23/18 1023

## 2018-03-16 ENCOUNTER — Ambulatory Visit (HOSPITAL_COMMUNITY)
Admission: EM | Admit: 2018-03-16 | Discharge: 2018-03-16 | Disposition: A | Discharge status: Home / Self Care | Payer: BLUE CROSS/BLUE SHIELD | Attending: Family Medicine | Admitting: Family Medicine

## 2018-03-16 ENCOUNTER — Encounter (HOSPITAL_COMMUNITY): Payer: Self-pay | Admitting: Emergency Medicine

## 2018-03-16 ENCOUNTER — Other Ambulatory Visit: Payer: Self-pay

## 2018-03-16 DIAGNOSIS — M5412 Radiculopathy, cervical region: Secondary | ICD-10-CM | POA: Diagnosis not present

## 2018-03-16 MED ORDER — DICLOFENAC SODIUM 75 MG PO TBEC: 75.0000 mg | DELAYED_RELEASE_TABLET | Freq: Two times a day (BID) | ORAL | 0 refills | Status: DC

## 2018-03-16 MED ORDER — HYDROCODONE-ACETAMINOPHEN 5-325 MG PO TABS: 1.0000 | ORAL_TABLET | Freq: Four times a day (QID) | ORAL | 0 refills | Status: DC | PRN

## 2018-03-16 MED ORDER — PREDNISONE 10 MG (48) PO TBPK: ORAL_TABLET | ORAL | 0 refills | Status: DC

## 2018-03-16 NOTE — ED Triage Notes (Signed)
Pt has been suffering from left shoulder, back, neck and arm pain for two months.  Pt was seen in the ED in March and is not getting any better.  He reports numbness and tingling in the left arm and hand and pain in his spine, shoulder blade and neck.

## 2018-03-16 NOTE — ED Provider Notes (Signed)
[facility]   [id number] 03/16/18 Arrival Time: 1127  ASSESSMENT & PLAN:  1. Cervical radiculopathy    Meds ordered this encounter  Medications  . HYDROcodone-acetaminophen (NORCO/VICODIN) 5-325 MG tablet    Sig: Take 1 tablet by mouth every 6 (six) hours as needed for moderate pain or severe pain.    Dispense:  8 tablet    Refill:  0  . predniSONE (STERAPRED UNI-PAK 48 TAB) 10 MG (48) TBPK tablet    Sig: Take as directed.    Dispense:  48 tablet    Refill:  0  . diclofenac (VOLTAREN) 75 MG EC tablet    Sig: Take 1 tablet (75 mg total) by mouth 2 (two) times daily.    Dispense:  14 tablet    Refill:  0   [place] Controlled Substances Registry consulted for this patient. I feel the risk/benefit ratio today is favorable for proceeding with this prescription for a controlled substance. Medication sedation precautions given.  Work note given with light duty/lifting restrictions. To make f/u appt with an orthopaedist for further evaluation.  Reviewed expectations re: course of current medical issues. Questions answered. Outlined signs and symptoms indicating need for more acute intervention. Patient verbalized understanding. After Visit Summary given.  SUBJECTIVE: History from: patient. Ernest Williams is a 47 y.o. male who reports intermittent mild to moderate pain of his left neck mainly that radiates to his left shoulder and arm; described as sharp and shooting. H/O similar in the past that responded to steroids and pain medication. Onset: gradual, this episode over the past few days. Injury/trama: no, but lifts frequently. Relieved by: nothing in particular. Worsened by: certain movements. Associated symptoms: none reported. Extremity sensation changes or weakness: occasional numbness/tingling in LUE associated with pain. Self treatment: tried OTCs without relief of pain.  ROS: As per HPI.   OBJECTIVE:  Vitals:   03/16/18 1239  BP: 127/88  Pulse: 81  Temp:  98.6 F (37 C)  TempSrc: Oral  SpO2: 96%    General appearance: alert; no distress Extremities: warm and well perfused; symmetrical with no gross deformities; localized tenderness over his left neck that extends toward left shoulder with no swelling and no bruising; neck ROM: normal but with reported pain; no midline tenderness CV: normal extremity capillary refill Skin: warm and dry Neurologic: normal gait; normal symmetric reflexes in all extremities; normal sensation in all extremities at this time Psychological: alert and cooperative; normal mood and affect  No Known Allergies  Past Medical History:  Diagnosis Date  . Hypertension    Social History   Socioeconomic History  . Marital status: Single    Spouse name: Not on file  . Number of children: Not on file  . Years of education: Not on file  . Highest education level: Not on file  Occupational History  . Not on file  Social Needs  . Financial resource strain: Not on file  . Food insecurity:    Worry: Not on file    Inability: Not on file  . Transportation needs:    Medical: Not on file    Non-medical: Not on file  Tobacco Use  . Smoking status: Former Smoker    Types: Cigarettes    Last attempt to quit: 2017    Years since quitting: 2.3  . Smokeless tobacco: Never Used  Substance and Sexual Activity  . Alcohol use: Yes    Comment: Socially  . Drug use: Yes    Types: Marijuana  . Sexual  activity: Not on file  Lifestyle  . Physical activity:    Days per week: Not on file    Minutes per session: Not on file  . Stress: Not on file  Relationships  . Social connections:    Talks on phone: Not on file    Gets together: Not on file    Attends religious service: Not on file    Active member of club or organization: Not on file    Attends meetings of clubs or organizations: Not on file    Relationship status: Not on file  . Intimate partner violence:    Fear of current or ex partner: Not on file     Emotionally abused: Not on file    Physically abused: Not on file    Forced sexual activity: Not on file  Other Topics Concern  . Not on file  Social History Narrative  . Not on file   History reviewed. No pertinent family history. History reviewed. No pertinent surgical history.    Mardella Layman, MD 03/18/18 [phone]

## 2018-03-16 NOTE — Discharge Instructions (Signed)
Be aware, pain medications may cause drowsiness. Please do not drive, operate heavy machinery or make important decisions while on this medication, it can cloud your judgement.  

## 2018-05-24 ENCOUNTER — Emergency Department (HOSPITAL_COMMUNITY)
Admission: EM | Admit: 2018-05-24 | Discharge: 2018-05-24 | Disposition: A | Discharge status: Home / Self Care | Payer: BLUE CROSS/BLUE SHIELD | Attending: Emergency Medicine | Admitting: Emergency Medicine

## 2018-05-24 ENCOUNTER — Encounter (HOSPITAL_COMMUNITY): Payer: Self-pay | Admitting: Emergency Medicine

## 2018-05-24 ENCOUNTER — Emergency Department (HOSPITAL_COMMUNITY): Payer: BLUE CROSS/BLUE SHIELD

## 2018-05-24 DIAGNOSIS — R209 Unspecified disturbances of skin sensation: Secondary | ICD-10-CM | POA: Insufficient documentation

## 2018-05-24 DIAGNOSIS — Z87891 Personal history of nicotine dependence: Secondary | ICD-10-CM | POA: Diagnosis not present

## 2018-05-24 DIAGNOSIS — I10 Essential (primary) hypertension: Secondary | ICD-10-CM | POA: Insufficient documentation

## 2018-05-24 DIAGNOSIS — Z79899 Other long term (current) drug therapy: Secondary | ICD-10-CM | POA: Insufficient documentation

## 2018-05-24 DIAGNOSIS — R319 Hematuria, unspecified: Secondary | ICD-10-CM | POA: Diagnosis present

## 2018-05-24 DIAGNOSIS — IMO0001 Reserved for inherently not codable concepts without codable children: Secondary | ICD-10-CM

## 2018-05-24 LAB — URINALYSIS, ROUTINE W REFLEX MICROSCOPIC
Bacteria, UA: NONE SEEN
Bilirubin Urine: NEGATIVE
Glucose, UA: NEGATIVE mg/dL
Ketones, ur: NEGATIVE mg/dL
LEUKOCYTES UA: NEGATIVE
Nitrite: NEGATIVE
PROTEIN: NEGATIVE mg/dL
Specific Gravity, Urine: 1.001 — ABNORMAL LOW (ref 1.005–1.030)
pH: 7 (ref 5.0–8.0)

## 2018-05-24 LAB — CBC
HCT: 45.8 % (ref 39.0–52.0)
HEMOGLOBIN: 15 g/dL (ref 13.0–17.0)
MCH: 30.6 pg (ref 26.0–34.0)
MCHC: 32.8 g/dL (ref 30.0–36.0)
MCV: 93.5 fL (ref 78.0–100.0)
Platelets: 238 10*3/uL (ref 150–400)
RBC: 4.9 MIL/uL (ref 4.22–5.81)
RDW: 13.4 % (ref 11.5–15.5)
WBC: 8.5 10*3/uL (ref 4.0–10.5)

## 2018-05-24 LAB — BASIC METABOLIC PANEL
ANION GAP: 10 (ref 5–15)
BUN: 11 mg/dL (ref 6–20)
CO2: 22 mmol/L (ref 22–32)
Calcium: 9 mg/dL (ref 8.9–10.3)
Chloride: 104 mmol/L (ref 98–111)
Creatinine, Ser: 1.09 mg/dL (ref 0.61–1.24)
Glucose, Bld: 131 mg/dL — ABNORMAL HIGH (ref 70–99)
POTASSIUM: 4.1 mmol/L (ref 3.5–5.1)
SODIUM: 136 mmol/L (ref 135–145)

## 2018-05-24 MED ORDER — MORPHINE SULFATE (PF) 4 MG/ML IV SOLN
6.0000 mg | Freq: Once | INTRAVENOUS | Status: AC
  Administered 2018-05-24: 6 mg via INTRAVENOUS
  Filled 2018-05-24: qty 2

## 2018-05-24 MED ORDER — LORAZEPAM 2 MG/ML IJ SOLN
1.0000 mg | Freq: Once | INTRAMUSCULAR | Status: AC
  Administered 2018-05-24: 1 mg via INTRAVENOUS
  Filled 2018-05-24: qty 1

## 2018-05-24 MED ORDER — SODIUM CHLORIDE 0.9 % IV SOLN
INTRAVENOUS | Status: DC
  Administered 2018-05-24: 08:00:00 via INTRAVENOUS

## 2018-05-24 NOTE — ED Notes (Signed)
Patient given discharge instructions and verbalized understanding.  Patient stable to discharge at this time.  Patient is alert and oriented to baseline.  No distressed noted at this time.  All belongings taken with the patient at discharge.   
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
## 2018-05-24 NOTE — ED Notes (Signed)
Patient transported to CT 

## 2018-05-24 NOTE — ED Triage Notes (Addendum)
Pt here with multiple complaints, reports "my whole left side hurts" states pain has been ongoing x3 months, pt also states he saw blood when he urinated this am which is his main concern. Denies any falls or known injuries.

## 2018-05-25 LAB — URINE CULTURE: Culture: NO GROWTH

## 2018-07-29 ENCOUNTER — Ambulatory Visit (HOSPITAL_COMMUNITY)
Admission: EM | Admit: 2018-07-29 | Discharge: 2018-07-29 | Disposition: A | Discharge status: Home / Self Care | Payer: BLUE CROSS/BLUE SHIELD | Attending: Family Medicine | Admitting: Family Medicine

## 2018-07-29 ENCOUNTER — Encounter (HOSPITAL_COMMUNITY): Payer: Self-pay | Admitting: Family Medicine

## 2018-07-29 DIAGNOSIS — K0889 Other specified disorders of teeth and supporting structures: Secondary | ICD-10-CM | POA: Diagnosis not present

## 2018-07-29 MED ORDER — KETOROLAC TROMETHAMINE 60 MG/2ML IM SOLN
60.0000 mg | Freq: Once | INTRAMUSCULAR | Status: AC
  Administered 2018-07-29: 60 mg via INTRAMUSCULAR

## 2018-07-29 MED ORDER — KETOROLAC TROMETHAMINE 60 MG/2ML IM SOLN
INTRAMUSCULAR | Status: AC
  Filled 2018-07-29: qty 2

## 2018-07-29 MED ORDER — LISINOPRIL 20 MG PO TABS: 20.0000 mg | ORAL_TABLET | Freq: Every day | ORAL | 0 refills | Status: DC

## 2018-07-29 MED ORDER — PENICILLIN V POTASSIUM 500 MG PO TABS: 500.0000 mg | ORAL_TABLET | Freq: Four times a day (QID) | ORAL | 0 refills | Status: AC

## 2018-07-29 MED ORDER — TRAMADOL HCL 50 MG PO TABS: 50.0000 mg | ORAL_TABLET | Freq: Four times a day (QID) | ORAL | 0 refills | Status: DC | PRN

## 2018-07-29 NOTE — ED Triage Notes (Signed)
Pt presents with dental pain.

## 2018-07-29 NOTE — Discharge Instructions (Signed)
It was nice meeting you!!  Go ahead and treat you for dental infection I am giving you antibiotics and pain medication Toradol injection given in clinic Please follow-up with your dentist as scheduled.

## 2018-07-30 ENCOUNTER — Encounter (HOSPITAL_COMMUNITY): Payer: Self-pay | Admitting: Family Medicine

## 2018-07-30 NOTE — ED Provider Notes (Signed)
[facility]    CSN: [id number] Arrival date & time: 07/29/18  1454     History   Chief Complaint Chief Complaint  Patient presents with  . Dental Pain    HPI Ernest Williams is a 47 y.o. male.    Dental Pain  Location:  Lower Quality:  Aching, constant and throbbing Severity:  Severe Onset quality:  Gradual Duration:  2 days Timing:  Constant Progression:  Worsening Chronicity:  New Context: dental caries   Previous work-up:  Dental exam (has been to the dentist but was unable to have procedure done due to elevated BP) Relieved by:  Nothing Worsened by:  Cold food/drink, hot food/drink, touching and pressure Associated symptoms: no congestion, no difficulty swallowing, no drooling, no facial pain, no facial swelling, no fever, no gum swelling, no headaches, no neck pain, no neck swelling, no oral bleeding, no oral lesions and no trismus     Past Medical History:  Diagnosis Date  . Hypertension     There are no active problems to display for this patient.   History reviewed. No pertinent surgical history.     Home Medications    Prior to Admission medications   Medication Sig Start Date End Date Taking? Authorizing Provider  aspirin 325 MG tablet Take 325 mg by mouth daily. For head ache     [provider]  cyclobenzaprine (FLEXERIL) 10 MG tablet Take 1 tablet (10 mg total) by mouth 3 (three) times daily. 08/27/17   Mardella LaymanHagler, Brian, MD  diclofenac (VOLTAREN) 75 MG EC tablet Take 1 tablet (75 mg total) by mouth 2 (two) times daily. Patient not taking: Reported on 05/24/2018 03/16/18   Mardella LaymanHagler, Brian, MD  diphenhydramine-acetaminophen (TYLENOL PM) 25-500 MG TABS tablet Take 5 tablets by mouth at bedtime as needed.    [provider]  ibuprofen (ADVIL,MOTRIN) 800 MG tablet Take 800 mg by mouth every 8 (eight) hours as needed.    [provider]  lisinopril (PRINIVIL,ZESTRIL) 20 MG tablet Take 1 tablet (20 mg total) by mouth daily.  07/29/18   Dahlia ByesBast,  A, NP  naproxen (NAPROSYN) 375 MG tablet Take 1 tablet (375 mg total) by mouth 2 (two) times daily. Patient not taking: Reported on 05/24/2018 01/22/18   Raeford RazorKohut, Stephen, MD  naproxen (NAPROSYN) 500 MG tablet Take 1 tablet (500 mg total) by mouth 2 (two) times daily. 06/26/17   Dartha LodgeFord, Kelsey N, PA-C  penicillin v potassium (VEETID) 500 MG tablet Take 1 tablet (500 mg total) by mouth 4 (four) times daily for 10 days. 07/29/18 08/08/18  Dahlia ByesBast,  A, NP  predniSONE (STERAPRED UNI-PAK 48 TAB) 10 MG (48) TBPK tablet Take as directed. Patient not taking: Reported on 05/24/2018 03/16/18   Mardella LaymanHagler, Brian, MD  traMADol (ULTRAM) 50 MG tablet Take 1 tablet (50 mg total) by mouth every 6 (six) hours as needed. 07/29/18   Janace ArisBast,  A, NP    Family History No family history on file.  Social History Social History   Tobacco Use  . Smoking status: Former Smoker    Types: Cigarettes    Last attempt to quit: 2017    Years since quitting: 2.7  . Smokeless tobacco: Never Used  Substance Use Topics  . Alcohol use: Yes    Comment: Socially  . Drug use: Yes    Types: Marijuana     Allergies   Tomato; Pear; and Strawberry (diagnostic)   Review of Systems Review of Systems  Constitutional: Negative for fever.  HENT: Positive for dental problem. Negative for congestion, drooling, facial swelling and mouth sores.   Musculoskeletal: Negative for neck pain.  Neurological: Negative for headaches.     Physical Exam Triage Vital Signs ED Triage Vitals  Enc Vitals Group     BP 07/29/18 1535 (!) 175/110     Pulse Rate 07/29/18 1535 90     Resp 07/29/18 1535 20     Temp 07/29/18 1535 99.1 F (37.3 C)     Temp Source 07/29/18 1535 Temporal     SpO2 07/29/18 1535 99 %     Weight --      Height --      Head Circumference --      Peak Flow --      Pain Score 07/29/18 1537 10     Pain Loc --      Pain Edu? --      Excl. in GC? --    No data found.  Updated Vital Signs BP (!)  175/110 (BP Location: Right Arm)   Pulse 90   Temp 99.1 F (37.3 C) (Temporal)   Resp 20   SpO2 99%   Visual Acuity Right Eye Distance:   Left Eye Distance:   Bilateral Distance:    Right Eye Near:   Left Eye Near:    Bilateral Near:     Physical Exam  Constitutional: He is oriented to person, place, and time. He appears well-developed and well-nourished.  HENT:  Head: Normocephalic and atraumatic.  Right lower dental pain with mild right facial swelling. Dental caries noted to right lower molar.   Eyes: Conjunctivae are normal.  Neck: Normal range of motion. Neck supple.  Pulmonary/Chest: Effort normal.  Musculoskeletal: Normal range of motion.  Lymphadenopathy:    He has no cervical adenopathy.  Neurological: He is alert and oriented to person, place, and time.  Skin: Skin is warm and dry.  Psychiatric: He has a normal mood and affect.  Nursing note and vitals reviewed.    UC Treatments / Results  Labs (all labs ordered are listed, but only abnormal results are displayed) Labs Reviewed - No data to display  EKG None  Radiology No results found.  Procedures Procedures (including critical care time)  Medications Ordered in UC Medications  ketorolac (TORADOL) injection 60 mg (60 mg Intramuscular Given 07/29/18 1643)    Initial Impression / Assessment and Plan / UC Course  I have reviewed the triage vital signs and the nursing notes.  Pertinent labs & imaging results that were available during my care of the patient were reviewed by me and considered in my medical decision making (see chart for details).     We will go ahead and treat for dental infection. Tramadol for severe dental pain Refill patient's lisinopril as requested Patient has plans to follow-up with his dentist sometime this week  Final Clinical Impressions(s) / UC Diagnoses   Final diagnoses:  Pain, dental     Discharge Instructions     It was nice meeting you!!  Go ahead and  treat you for dental infection I am giving you antibiotics and pain medication Toradol injection given in clinic Please follow-up with your dentist as scheduled.    ED Prescriptions    Medication Sig Dispense Auth. Provider   traMADol (ULTRAM) 50 MG tablet Take 1 tablet (50 mg total) by mouth every 6 (six) hours as needed. 15 tablet ,  A, NP   penicillin v potassium (VEETID) 500 MG tablet Take  1 tablet (500 mg total) by mouth 4 (four) times daily for 10 days. 40 tablet ,  A, NP   lisinopril (PRINIVIL,ZESTRIL) 20 MG tablet Take 1 tablet (20 mg total) by mouth daily. 30 tablet Dahlia Byes A, NP     Controlled Substance Prescriptions [place] Controlled Substance Registry consulted? Yes, I have consulted the [place] Controlled Substances Registry for this patient, and feel the risk/benefit ratio today is favorable for proceeding with this prescription for a controlled substance.   Janace Aris, NP 07/30/18 1058

## 2018-12-09 ENCOUNTER — Other Ambulatory Visit: Payer: Self-pay

## 2018-12-09 ENCOUNTER — Encounter (HOSPITAL_COMMUNITY): Payer: Self-pay | Admitting: Emergency Medicine

## 2018-12-09 ENCOUNTER — Ambulatory Visit (HOSPITAL_COMMUNITY)
Admission: EM | Admit: 2018-12-09 | Discharge: 2018-12-09 | Disposition: A | Discharge status: Home / Self Care | Payer: BLUE CROSS/BLUE SHIELD | Attending: Family Medicine | Admitting: Family Medicine

## 2018-12-09 DIAGNOSIS — R109 Unspecified abdominal pain: Secondary | ICD-10-CM

## 2018-12-09 DIAGNOSIS — I10 Essential (primary) hypertension: Secondary | ICD-10-CM | POA: Diagnosis not present

## 2018-12-09 DIAGNOSIS — M62838 Other muscle spasm: Secondary | ICD-10-CM

## 2018-12-09 DIAGNOSIS — R079 Chest pain, unspecified: Secondary | ICD-10-CM

## 2018-12-09 LAB — POCT URINALYSIS DIP (DEVICE)
Bilirubin Urine: NEGATIVE
Glucose, UA: NEGATIVE mg/dL
Hgb urine dipstick: NEGATIVE
Nitrite: NEGATIVE
PH: 6 (ref 5.0–8.0)
PROTEIN: NEGATIVE mg/dL
Specific Gravity, Urine: 1.02 (ref 1.005–1.030)
Urobilinogen, UA: 0.2 mg/dL (ref 0.0–1.0)

## 2018-12-09 MED ORDER — KETOROLAC TROMETHAMINE 30 MG/ML IJ SOLN
INTRAMUSCULAR | Status: AC
  Filled 2018-12-09: qty 1

## 2018-12-09 MED ORDER — IBUPROFEN 800 MG PO TABS: 800.0000 mg | ORAL_TABLET | Freq: Three times a day (TID) | ORAL | 0 refills | Status: DC

## 2018-12-09 MED ORDER — CYCLOBENZAPRINE HCL 5 MG PO TABS: 5.0000 mg | ORAL_TABLET | Freq: Two times a day (BID) | ORAL | 0 refills | Status: DC | PRN

## 2018-12-09 MED ORDER — KETOROLAC TROMETHAMINE 30 MG/ML IJ SOLN
30.0000 mg | Freq: Once | INTRAMUSCULAR | Status: AC
  Administered 2018-12-09: 30 mg via INTRAMUSCULAR

## 2018-12-09 NOTE — Discharge Instructions (Signed)
Your pain seems to be more musculoskeletal We gave you an injection of Toradol Use anti-inflammatories for pain/swelling. You may take up to 800 mg Ibuprofen every 8 hours with food. You may supplement Ibuprofen with Tylenol [phone] mg every 4-6 hours.  You may use flexeril as needed to help with pain. This is a muscle relaxer and causes sedation- please use only at bedtime or when you will be home and not have to drive/work-begin with 1 tablet, may increase to 2 tablets if not causing too much drowsiness Apply ice and heat, alternate  Please follow-up if pain changing, worsening, developing new symptoms with your pain, developing nausea or vomiting, shortness of breath, worsening discomfort

## 2018-12-09 NOTE — ED Triage Notes (Signed)
The patient presented to the [facility] with a complaint of left side chest, flank and arm pain that is reproducible and has been on going for 4 days. The patient described the pain as sharp upon palpation.

## 2018-12-10 NOTE — ED Provider Notes (Signed)
[name]    CSN: [id number] Arrival date & time: 12/09/18  1740     History   Chief Complaint Chief Complaint  Patient presents with  . Flank Pain    HPI Ernest Williams is a 48 y.o. male history of hypertension presenting today for evaluation of left side pain.  Patient states that over the past 4 days he has had discomfort in his left side, back.  Occasionally has discomfort in his left chest as well as shoulder area.  Denies numbness or tingling.  Denies shortness of breath.  Denies associated nausea or vomiting.  States pain worsens with certain movements.  Denies any dysuria, increased frequency.  Denies history of diabetes.  Denies smoking history.  HPI  Past Medical History:  Diagnosis Date  . Hypertension     There are no active problems to display for this patient.   History reviewed. No pertinent surgical history.     Home Medications    Prior to Admission medications   Medication Sig Start Date End Date Taking? Authorizing Provider  lisinopril (PRINIVIL,ZESTRIL) 20 MG tablet Take 1 tablet (20 mg total) by mouth daily. 07/29/18  Yes Bast, Traci A, NP  naproxen (NAPROSYN) 375 MG tablet Take 1 tablet (375 mg total) by mouth 2 (two) times daily. 01/22/18  Yes Raeford RazorKohut, Stephen, MD  naproxen (NAPROSYN) 500 MG tablet Take 1 tablet (500 mg total) by mouth 2 (two) times daily. 06/26/17  Yes Dartha LodgeFord, Kelsey N, PA-C  cyclobenzaprine (FLEXERIL) 5 MG tablet Take 1-2 tablets (5-10 mg total) by mouth 2 (two) times daily as needed for muscle spasms. 12/09/18   Wieters, Hallie C, PA-C  ibuprofen (ADVIL,MOTRIN) 800 MG tablet Take 1 tablet (800 mg total) by mouth 3 (three) times daily. 12/09/18   Wieters, Junius CreamerHallie C, PA-C    Family History History reviewed. No pertinent family history.  Social History Social History   Tobacco Use  . Smoking status: Former Smoker    Types: Cigarettes    Last attempt to quit: 2017    Years since quitting: 3.0  . Smokeless tobacco:  Never Used  Substance Use Topics  . Alcohol use: Yes    Comment: Socially  . Drug use: Yes    Types: Marijuana     Allergies   Tomato; Pear; and Strawberry (diagnostic)   Review of Systems Review of Systems  Constitutional: Negative for activity change, chills, diaphoresis and fatigue.  HENT: Negative for ear pain, tinnitus and trouble swallowing.   Eyes: Negative for photophobia and visual disturbance.  Respiratory: Negative for cough, chest tightness and shortness of breath.   Cardiovascular: Positive for chest pain. Negative for leg swelling.  Gastrointestinal: Negative for abdominal pain, blood in stool, nausea and vomiting.  Musculoskeletal: Positive for back pain and myalgias. Negative for arthralgias, gait problem, neck pain and neck stiffness.  Skin: Negative for color change and wound.  Neurological: Negative for dizziness, weakness, light-headedness, numbness and headaches.     Physical Exam Triage Vital Signs ED Triage Vitals  Enc Vitals Group     BP 12/09/18 1838 (!) 165/104     Pulse Rate 12/09/18 1838 81     Resp 12/09/18 1838 18     Temp 12/09/18 1838 98.4 F (36.9 C)     Temp Source 12/09/18 1838 Oral     SpO2 12/09/18 1838 97 %     Weight --      Height --      Head Circumference --  Peak Flow --      Pain Score 12/09/18 1837 7     Pain Loc --      Pain Edu? --      Excl. in GC? --    No data found.  Updated Vital Signs BP (!) 165/104 (BP Location: Left Arm)   Pulse 81   Temp 98.4 F (36.9 C) (Oral)   Resp 18   SpO2 97%   Visual Acuity Right Eye Distance:   Left Eye Distance:   Bilateral Distance:    Right Eye Near:   Left Eye Near:    Bilateral Near:     Physical Exam Vitals signs and nursing note reviewed.  Constitutional:      Appearance: He is well-developed.  HENT:     Head: Normocephalic and atraumatic.     Mouth/Throat:     Comments: Oral mucosa pink and moist, no tonsillar enlargement or exudate. Posterior pharynx  patent and nonerythematous, no uvula deviation or swelling. Normal phonation. Eyes:     Extraocular Movements: Extraocular movements intact.     Conjunctiva/sclera: Conjunctivae normal.     Pupils: Pupils are equal, round, and reactive to light.  Neck:     Musculoskeletal: Neck supple.  Cardiovascular:     Rate and Rhythm: Normal rate and regular rhythm.     Heart sounds: No murmur.  Pulmonary:     Effort: Pulmonary effort is normal. No respiratory distress.     Breath sounds: Normal breath sounds.     Comments: Breathing comfortably at rest, CTABL, no wheezing, rales or other adventitious sounds auscultated Chest:     Chest wall: Tenderness present.  Abdominal:     Palpations: Abdomen is soft.     Tenderness: There is no abdominal tenderness.  Musculoskeletal:     Comments: Significant tenderness to palpation along left flank, left trapezius musculature extending under left pectoral area and throughout left pectoralis, tenderness throughout left trapezius  Skin:    General: Skin is warm and dry.  Neurological:     Mental Status: He is alert.      UC Treatments / Results  Labs (all labs ordered are listed, but only abnormal results are displayed) Labs Reviewed  POCT URINALYSIS DIP (DEVICE) - Abnormal; Notable for the following components:      Result Value   Ketones, ur TRACE (*)    Leukocytes, UA MODERATE (*)    All other components within normal limits  URINE CULTURE    EKG None  Radiology No results found.  Procedures Procedures (including critical care time)  Medications Ordered in UC Medications  ketorolac (TORADOL) 30 MG/ML injection 30 mg (30 mg Intramuscular Given 12/09/18 1909)    Initial Impression / Assessment and Plan / UC Course  I have reviewed the triage vital signs and the nursing notes.  Pertinent labs & imaging results that were available during my care of the patient were reviewed by me and considered in my medical decision making (see chart  for details).     Moderate leuks on UA, will send for culture, symptoms seem more musculoskeletal given reproducibility, EKG normal sinus rhythm, no signs of ischemia or infarction.  Do not suspect underlying cardiac or pulmonary cause.  At this time will treat with anti-inflammatories, Toradol provided in clinic, Tylenol and ibuprofen at home, will also provide Flexeril to help with any muscle tension and tightness/spasms.  Continue to monitor,Discussed strict return precautions. Patient verbalized understanding and is agreeable with plan.  Final Clinical Impressions(s) /  UC Diagnoses   Final diagnoses:  Left flank pain     Discharge Instructions     Your pain seems to be more musculoskeletal We gave you an injection of Toradol Use anti-inflammatories for pain/swelling. You may take up to 800 mg Ibuprofen every 8 hours with food. You may supplement Ibuprofen with Tylenol [phone] mg every 4-6 hours.  You may use flexeril as needed to help with pain. This is a muscle relaxer and causes sedation- please use only at bedtime or when you will be home and not have to drive/work-begin with 1 tablet, may increase to 2 tablets if not causing too much drowsiness Apply ice and heat, alternate  Please follow-up if pain changing, worsening, developing new symptoms with your pain, developing nausea or vomiting, shortness of breath, worsening discomfort   ED Prescriptions    Medication Sig Dispense Auth. Provider   ibuprofen (ADVIL,MOTRIN) 800 MG tablet Take 1 tablet (800 mg total) by mouth 3 (three) times daily. 21 tablet Wieters, Hallie C, PA-C   cyclobenzaprine (FLEXERIL) 5 MG tablet Take 1-2 tablets (5-10 mg total) by mouth 2 (two) times daily as needed for muscle spasms. 24 tablet Wieters, [place] C, PA-C     Controlled Substance Prescriptions [place] Controlled Substance Registry consulted? Not Applicable   Lew Dawes, [place] 12/10/18 1310

## 2018-12-11 LAB — URINE CULTURE: Culture: NO GROWTH

## 2019-05-06 ENCOUNTER — Ambulatory Visit (HOSPITAL_COMMUNITY)
Admission: EM | Admit: 2019-05-06 | Discharge: 2019-05-06 | Disposition: A | Discharge status: Home / Self Care | Payer: BC Managed Care – PPO | Attending: Internal Medicine | Admitting: Internal Medicine

## 2019-05-06 ENCOUNTER — Encounter (HOSPITAL_COMMUNITY): Payer: Self-pay

## 2019-05-06 ENCOUNTER — Other Ambulatory Visit: Payer: Self-pay

## 2019-05-06 DIAGNOSIS — Z9114 Patient's other noncompliance with medication regimen: Secondary | ICD-10-CM | POA: Diagnosis not present

## 2019-05-06 DIAGNOSIS — M62830 Muscle spasm of back: Secondary | ICD-10-CM | POA: Insufficient documentation

## 2019-05-06 DIAGNOSIS — I10 Essential (primary) hypertension: Secondary | ICD-10-CM | POA: Diagnosis not present

## 2019-05-06 LAB — BASIC METABOLIC PANEL
Anion gap: 10 (ref 5–15)
BUN: 12 mg/dL (ref 6–20)
CO2: 27 mmol/L (ref 22–32)
Calcium: 9.9 mg/dL (ref 8.9–10.3)
Chloride: 103 mmol/L (ref 98–111)
Creatinine, Ser: 1.14 mg/dL (ref 0.61–1.24)
GFR calc Af Amer: >60 mL/min (ref >60)
GFR calc non Af Amer: >60 mL/min (ref >60)
Glucose, Bld: 127 mg/dL — ABNORMAL HIGH (ref 70–99)
Potassium: 4.5 mmol/L (ref 3.5–5.1)
Sodium: 140 mmol/L (ref 135–145)

## 2019-05-06 MED ORDER — CYCLOBENZAPRINE HCL 5 MG PO TABS: 5.0000 mg | ORAL_TABLET | Freq: Two times a day (BID) | ORAL | 0 refills | Status: DC | PRN

## 2019-05-06 MED ORDER — KETOROLAC TROMETHAMINE 60 MG/2ML IM SOLN
60.0000 mg | Freq: Once | INTRAMUSCULAR | Status: AC
  Administered 2019-05-06: 60 mg via INTRAMUSCULAR

## 2019-05-06 MED ORDER — KETOROLAC TROMETHAMINE 60 MG/2ML IM SOLN
INTRAMUSCULAR | Status: AC
  Filled 2019-05-06: qty 2

## 2019-05-06 MED ORDER — NAPROXEN 375 MG PO TABS: 375.0000 mg | ORAL_TABLET | Freq: Two times a day (BID) | ORAL | 0 refills | Status: DC | PRN

## 2019-05-06 MED ORDER — LISINOPRIL 20 MG PO TABS: 20.0000 mg | ORAL_TABLET | Freq: Every day | ORAL | 2 refills | Status: DC

## 2019-05-06 NOTE — ED Triage Notes (Signed)
Patient presents to Urgent Care with complaints of neck pain that radiates down both sides of his back since 5 days ago, progressively worsening. Patient reports he also thinks he tore his MCL on his left knee if February and has not had it looked at yet, wearing brace upon arrival.

## 2019-05-06 NOTE — ED Provider Notes (Signed)
[place]    CSN: [id number] Arrival date & time: 05/06/19  1106     History   Chief Complaint Chief Complaint  Patient presents with  . Neck Pain  . Back Pain  . Knee Pain    HPI Ernest Williams is a 48 y.o. male with a history of hypertension comes to urgent care with complaints of generalized body aches with muscle spasms involving the back, neck and shoulders.  Patient says that he is tried stretching at home as well as yoga with no improvement.  No trauma.  Symptoms started 2 weeks ago and is gotten progressively worse.  Pain is currently 8/10.  No numbness or tingling.  No weakness in the upper or lower extremities.  No dizziness.Marland Kitchen   HPI  Past Medical History:  Diagnosis Date  . Hypertension     There are no active problems to display for this patient.   History reviewed. No pertinent surgical history.     Home Medications    Prior to Admission medications   Medication Sig Start Date End Date Taking? Authorizing Provider  cyclobenzaprine (FLEXERIL) 5 MG tablet Take 1-2 tablets (5-10 mg total) by mouth 2 (two) times daily as needed for muscle spasms. 05/06/19   Chase Picket, MD  lisinopril (ZESTRIL) 20 MG tablet Take 1 tablet (20 mg total) by mouth daily. 05/06/19   Chase Picket, MD  naproxen (NAPROSYN) 375 MG tablet Take 1 tablet (375 mg total) by mouth 2 (two) times daily as needed. 05/06/19   LampteyMyrene Galas, MD    Family History Family History  Problem Relation Age of Onset  . Diabetes Mother   . Healthy Father     Social History Social History   Tobacco Use  . Smoking status: Former Smoker    Types: Cigarettes    Quit date: 2017    Years since quitting: 3.4  . Smokeless tobacco: Never Used  Substance Use Topics  . Alcohol use: Yes    Comment: Socially  . Drug use: Yes    Types: Marijuana     Allergies   Tomato, Pear, and Strawberry (diagnostic)   Review of Systems Review of Systems  Constitutional: Positive for  activity change. Negative for chills, fatigue and fever.  HENT: Negative.   Respiratory: Positive for cough. Negative for choking, chest tightness, shortness of breath and wheezing.   Cardiovascular: Negative.   Gastrointestinal: Negative.  Negative for abdominal pain, diarrhea and nausea.  Genitourinary: Negative.   Musculoskeletal: Negative for arthralgias and joint swelling.  Skin: Negative.      Physical Exam Triage Vital Signs ED Triage Vitals  Enc Vitals Group     BP 05/06/19 1127 (!) 147/125     Pulse Rate 05/06/19 1127 84     Resp 05/06/19 1127 17     Temp 05/06/19 1127 99.2 F (37.3 C)     Temp Source 05/06/19 1127 Oral     SpO2 05/06/19 1127 100 %     Weight --      Height --      Head Circumference --      Peak Flow --      Pain Score 05/06/19 1125 9     Pain Loc --      Pain Edu? --      Excl. in [place]? --    No data found.  Updated Vital Signs BP (!) 147/125 (BP Location: Left Arm)   Pulse 84   Temp 99.2 F (  37.3 C) (Oral)   Resp 17   SpO2 100%   Visual Acuity Right Eye Distance:   Left Eye Distance:   Bilateral Distance:    Right Eye Near:   Left Eye Near:    Bilateral Near:     Physical Exam Constitutional:      Appearance: Normal appearance.  Cardiovascular:     Rate and Rhythm: Normal rate and regular rhythm.     Pulses: Normal pulses.     Heart sounds: Normal heart sounds.  Pulmonary:     Effort: Pulmonary effort is normal.     Breath sounds: Normal breath sounds.  Abdominal:     General: Bowel sounds are normal.     Palpations: Abdomen is soft.  Musculoskeletal: Normal range of motion.  Skin:    General: Skin is warm.     Capillary Refill: Capillary refill takes less than 2 seconds.  Neurological:     Mental Status: He is alert.      UC Treatments / Results  Labs (all labs ordered are listed, but only abnormal results are displayed) Labs Reviewed  BASIC METABOLIC PANEL    EKG None  Radiology No results found.   Procedures Procedures (including critical care time)  Medications Ordered in UC Medications  ketorolac (TORADOL) injection 60 mg (60 mg Intramuscular Given 05/06/19 1147)  ketorolac (TORADOL) 60 MG/2ML injection (has no administration in time range)    Initial Impression / Assessment and Plan / UC Course  I have reviewed the triage vital signs and the nursing notes.  Pertinent labs & imaging results that were available during my care of the patient were reviewed by me and considered in my medical decision making (see chart for details).     1.  Spasm of thoracic back muscle: Flexeril Toradol IM once Naproxen 375 mg twice daily  2.  Uncontrolled hypertension secondary to medication noncompliance and pain: Refill lisinopril.  Prescription sent to the pharmacy. Final Clinical Impressions(s) / UC Diagnoses   Final diagnoses:  Spasm of thoracic back muscle   Discharge Instructions   None    ED Prescriptions    Medication Sig Dispense Auth. Provider   naproxen (NAPROSYN) 375 MG tablet Take 1 tablet (375 mg total) by mouth 2 (two) times daily as needed. 30 tablet Lamptey, Britta MccreedyPhilip O, MD   lisinopril (ZESTRIL) 20 MG tablet Take 1 tablet (20 mg total) by mouth daily. 30 tablet Lamptey, Britta MccreedyPhilip O, MD   cyclobenzaprine (FLEXERIL) 5 MG tablet Take 1-2 tablets (5-10 mg total) by mouth 2 (two) times daily as needed for muscle spasms. 30 tablet Lamptey, Britta MccreedyPhilip O, MD     Controlled Substance Prescriptions [place] Controlled Substance Registry consulted? No   Merrilee JanskyLamptey, Philip O, MD 05/06/19 1154

## 2019-05-07 ENCOUNTER — Telehealth (HOSPITAL_COMMUNITY): Payer: Self-pay | Admitting: Emergency Medicine

## 2019-05-07 NOTE — Telephone Encounter (Signed)
Attempted to reach patient. No answer at this time. Labs clinically insignificant.

## 2019-08-27 ENCOUNTER — Ambulatory Visit (INDEPENDENT_AMBULATORY_CARE_PROVIDER_SITE_OTHER): Payer: BC Managed Care – PPO

## 2019-08-27 ENCOUNTER — Ambulatory Visit (HOSPITAL_COMMUNITY)
Admission: EM | Admit: 2019-08-27 | Discharge: 2019-08-27 | Disposition: A | Discharge status: Home / Self Care | Payer: BC Managed Care – PPO | Attending: Family Medicine | Admitting: Family Medicine

## 2019-08-27 ENCOUNTER — Encounter (HOSPITAL_COMMUNITY): Payer: Self-pay

## 2019-08-27 ENCOUNTER — Other Ambulatory Visit: Payer: Self-pay

## 2019-08-27 DIAGNOSIS — M25552 Pain in left hip: Secondary | ICD-10-CM | POA: Diagnosis not present

## 2019-08-27 MED ORDER — NAPROXEN 375 MG PO TABS: 375.0000 mg | ORAL_TABLET | Freq: Two times a day (BID) | ORAL | 0 refills | Status: DC | PRN

## 2019-08-27 MED ORDER — CYCLOBENZAPRINE HCL 5 MG PO TABS: 5.0000 mg | ORAL_TABLET | Freq: Two times a day (BID) | ORAL | 0 refills | Status: DC | PRN

## 2019-08-27 NOTE — Discharge Instructions (Addendum)
Expect improvement in the next 2 days.  If pain persists, please return for reevaluation

## 2019-08-27 NOTE — ED Provider Notes (Signed)
[place]    CSN: [id number] Arrival date & time: 08/27/19  1522      History   Chief Complaint Chief Complaint  Patient presents with  . Hip Pain  . Leg Problem    HPI Ernest Williams is a 48 y.o. male.   Established [facility] patient  Pt states he was walking today at work and he heard his hip pop, and he immediately started having hip pain and left leg pain and numbness lower down.  The pain has persisted.   Patient has pain with weight bearing in that hip, or with light touch to upper gluteal area.    Patient is recovering from left knee MCL earlier this year but knee is not really affected.  Patient walked to work this morning without problem.  He is a Architectural technologist at [employer].           Past Medical History:  Diagnosis Date  . Hypertension     There are no active problems to display for this patient.   History reviewed. No pertinent surgical history.     Home Medications    Prior to Admission medications   Medication Sig Start Date End Date Taking? Authorizing Provider  cyclobenzaprine (FLEXERIL) 5 MG tablet Take 1-2 tablets (5-10 mg total) by mouth 2 (two) times daily as needed for muscle spasms. 08/27/19   Robyn Haber, MD  lisinopril (ZESTRIL) 20 MG tablet Take 1 tablet (20 mg total) by mouth daily. 05/06/19   Chase Picket, MD  naproxen (NAPROSYN) 375 MG tablet Take 1 tablet (375 mg total) by mouth 2 (two) times daily as needed. 08/27/19   Robyn Haber, MD    Family History Family History  Problem Relation Age of Onset  . Diabetes Mother   . Healthy Father     Social History Social History   Tobacco Use  . Smoking status: Former Smoker    Types: Cigarettes    Quit date: 2017    Years since quitting: 3.7  . Smokeless tobacco: Never Used  Substance Use Topics  . Alcohol use: Yes    Comment: Socially  . Drug use: Yes    Types: Marijuana     Allergies   Tomato, Pear, and Strawberry (diagnostic)    Review of Systems Review of Systems   Physical Exam Triage Vital Signs ED Triage Vitals  Enc Vitals Group     BP 08/27/19 1542 (!) 160/91     Pulse Rate 08/27/19 1542 87     Resp 08/27/19 1542 17     Temp 08/27/19 1542 98.4 F (36.9 C)     Temp Source 08/27/19 1542 Oral     SpO2 08/27/19 1542 97 %     Weight --      Height --      Head Circumference --      Peak Flow --      Pain Score 08/27/19 1540 7     Pain Loc --      Pain Edu? --      Excl. in [place]? --    No data found.  Updated Vital Signs BP (!) 160/91 (BP Location: Right Arm)   Pulse 87   Temp 98.4 F (36.9 C) (Oral)   Resp 17   SpO2 97%    Physical Exam Vitals signs and nursing note reviewed.  Constitutional:      General: He is not in acute distress.    Appearance: Normal appearance. He is obese.  He is not ill-appearing.  HENT:     Head: Normocephalic.  Eyes:     Conjunctiva/sclera: Conjunctivae normal.  Neck:     Musculoskeletal: Normal range of motion and neck supple.  Cardiovascular:     Rate and Rhythm: Normal rate.  Pulmonary:     Effort: Pulmonary effort is normal.  Musculoskeletal: Normal range of motion.        General: Tenderness present. No swelling.     Comments: Even light touch to upper left gluteal area elicits sharp pain and patient jumps away  Negative SLR each side.  Skin:    General: Skin is warm and dry.  Neurological:     General: No focal deficit present.     Mental Status: He is alert.     Gait: Gait abnormal.  Psychiatric:        Mood and Affect: Mood normal.      UC Treatments / Results  Labs (all labs ordered are listed, but only abnormal results are displayed) Labs Reviewed - No data to display  EKG   Radiology Dg Hip Unilat With Pelvis 2-3 Views Left  Result Date: 08/27/2019 CLINICAL DATA:  Left hip pain after walking. EXAM: DG HIP (WITH OR WITHOUT PELVIS) 2-3V LEFT COMPARISON:  Abdomen and pelvis CT from 2017 FINDINGS: Single view pelvis and AP and  lateral views of the left hip without signs of fracture or dislocation. Soft tissues are unremarkable. IMPRESSION: Normal study. No cause for the patient's left hip pain. Electronically Signed   By: Donzetta Kohut M.D.   On: 08/27/2019 16:34    Procedures Procedures (including critical care time)  Medications Ordered in UC Medications - No data to display  Initial Impression / Assessment and Plan / UC Course  I have reviewed the triage vital signs and the nursing notes.  Pertinent labs & imaging results that were available during my care of the patient were reviewed by me and considered in my medical decision making (see chart for details).    Final Clinical Impressions(s) / UC Diagnoses   Final diagnoses:  Left hip pain  Acute pain of left hip     Discharge Instructions     Expect improvement in the next 2 days.  If pain persists, please return for reevaluation    ED Prescriptions    Medication Sig Dispense Auth. Provider   cyclobenzaprine (FLEXERIL) 5 MG tablet Take 1-2 tablets (5-10 mg total) by mouth 2 (two) times daily as needed for muscle spasms. 30 tablet Elvina Sidle, MD   naproxen (NAPROSYN) 375 MG tablet Take 1 tablet (375 mg total) by mouth 2 (two) times daily as needed. 30 tablet Elvina Sidle, MD     I have reviewed the PDMP during this encounter.   Elvina Sidle, MD 08/27/19 1655

## 2019-08-27 NOTE — ED Triage Notes (Signed)
Pt states he was walking today at work and he hear his hip popped and he started having hip pain and left leg pain and numbness.

## 2019-09-28 ENCOUNTER — Other Ambulatory Visit: Payer: Self-pay

## 2019-09-28 ENCOUNTER — Emergency Department (HOSPITAL_COMMUNITY): Payer: BC Managed Care – PPO

## 2019-09-28 ENCOUNTER — Emergency Department (HOSPITAL_COMMUNITY)
Admission: EM | Admit: 2019-09-28 | Discharge: 2019-09-28 | Disposition: A | Discharge status: Home / Self Care | Payer: BC Managed Care – PPO | Attending: Emergency Medicine | Admitting: Emergency Medicine

## 2019-09-28 DIAGNOSIS — S299XXA Unspecified injury of thorax, initial encounter: Secondary | ICD-10-CM | POA: Diagnosis not present

## 2019-09-28 DIAGNOSIS — I10 Essential (primary) hypertension: Secondary | ICD-10-CM | POA: Insufficient documentation

## 2019-09-28 DIAGNOSIS — X500XXA Overexertion from strenuous movement or load, initial encounter: Secondary | ICD-10-CM | POA: Diagnosis not present

## 2019-09-28 DIAGNOSIS — Y999 Unspecified external cause status: Secondary | ICD-10-CM | POA: Diagnosis not present

## 2019-09-28 DIAGNOSIS — Y9389 Activity, other specified: Secondary | ICD-10-CM | POA: Insufficient documentation

## 2019-09-28 DIAGNOSIS — Z87891 Personal history of nicotine dependence: Secondary | ICD-10-CM | POA: Insufficient documentation

## 2019-09-28 DIAGNOSIS — R0781 Pleurodynia: Secondary | ICD-10-CM

## 2019-09-28 DIAGNOSIS — Z79899 Other long term (current) drug therapy: Secondary | ICD-10-CM | POA: Insufficient documentation

## 2019-09-28 DIAGNOSIS — Y929 Unspecified place or not applicable: Secondary | ICD-10-CM | POA: Diagnosis not present

## 2019-09-28 MED ORDER — TRAMADOL HCL 50 MG PO TABS: 50.0000 mg | ORAL_TABLET | Freq: Four times a day (QID) | ORAL | 0 refills | Status: DC | PRN

## 2019-09-28 MED ORDER — IBUPROFEN 800 MG PO TABS: 800.0000 mg | ORAL_TABLET | Freq: Three times a day (TID) | ORAL | 0 refills | Status: DC

## 2019-09-28 MED ORDER — OXYCODONE-ACETAMINOPHEN 5-325 MG PO TABS
1.0000 | ORAL_TABLET | Freq: Once | ORAL | Status: AC
  Administered 2019-09-28: 1 via ORAL
  Filled 2019-09-28: qty 1

## 2019-09-28 NOTE — ED Notes (Signed)
Patient transported to X-ray 

## 2019-09-28 NOTE — Discharge Instructions (Signed)
Rib films did not show fracture today. Take the prescribed medication as directed.  Use incentive spirometer a few times a day to keep lungs and breathing strong. Return to the ED for new or worsening symptoms.

## 2019-09-28 NOTE — ED Triage Notes (Signed)
Pt sts Friday he was bending over to pick something up and heard three pops in his right rib cage with associated pain. Since then pt has had trouble breathing and continued pain to R rib cage.

## 2019-09-28 NOTE — ED Notes (Signed)
Patient verbalizes understanding of discharge instructions. Opportunity for questioning and answers were provided. Armband removed by staff, pt discharged from ED.  

## 2019-09-28 NOTE — ED Provider Notes (Signed)
[facility] EMERGENCY DEPARTMENT Provider Note   CSN: [id number] Arrival date & time: 09/28/19  1739     History   Chief Complaint No chief complaint on file.   HPI Ernest Williams is a 48 y.o. male.     The history is provided by the patient and medical records.     48 y.o. with hx of HTN, presenting to the ED for rib pain.  States he was bending over a box today to lift up a 50 lb object and he felt 3 pops in his right upper ribs.  He is concerned he may have cracked a rib or two.  He reports since then he has been having ongoing right rib pain, worse with movement and deep breathing.  No hemoptysis.  No meds PTA.  Past Medical History:  Diagnosis Date  . Hypertension     There are no active problems to display for this patient.   No past surgical history on file.      Home Medications    Prior to Admission medications   Medication Sig Start Date End Date Taking? Authorizing Provider  cyclobenzaprine (FLEXERIL) 5 MG tablet Take 1-2 tablets (5-10 mg total) by mouth 2 (two) times daily as needed for muscle spasms. 08/27/19   Elvina Sidle, MD  lisinopril (ZESTRIL) 20 MG tablet Take 1 tablet (20 mg total) by mouth daily. 05/06/19   Merrilee Jansky, MD  naproxen (NAPROSYN) 375 MG tablet Take 1 tablet (375 mg total) by mouth 2 (two) times daily as needed. 08/27/19   Elvina Sidle, MD    Family History Family History  Problem Relation Age of Onset  . Diabetes Mother   . Healthy Father     Social History Social History   Tobacco Use  . Smoking status: Former Smoker    Types: Cigarettes    Quit date: 2017    Years since quitting: 3.8  . Smokeless tobacco: Never Used  Substance Use Topics  . Alcohol use: Yes    Comment: Socially  . Drug use: Yes    Types: Marijuana     Allergies   Tomato, Pear, and Strawberry (diagnostic)   Review of Systems Review of Systems  Cardiovascular: Positive for chest pain (right rib).  All other  systems reviewed and are negative.    Physical Exam Updated Vital Signs BP (!) 144/99 (BP Location: Left Arm)   Pulse 76   Temp 98.5 F (36.9 C)   Resp 14   SpO2 99%   Physical Exam Vitals signs and nursing note reviewed.  Constitutional:      Appearance: He is well-developed.  HENT:     Head: Normocephalic and atraumatic.  Eyes:     Conjunctiva/sclera: Conjunctivae normal.     Pupils: Pupils are equal, round, and reactive to light.  Neck:     Musculoskeletal: Normal range of motion.  Cardiovascular:     Rate and Rhythm: Normal rate and regular rhythm.     Heart sounds: Normal heart sounds.  Pulmonary:     Effort: Pulmonary effort is normal.     Breath sounds: Normal breath sounds.  Chest:     Comments: Right upper anterior and lateral rib pain, no gross deformity or bruising noted, lungs clear bilaterally Abdominal:     General: Bowel sounds are normal.     Palpations: Abdomen is soft.  Musculoskeletal: Normal range of motion.  Skin:    General: Skin is warm and dry.  Neurological:  Mental Status: He is alert and oriented to person, place, and time.      ED Treatments / Results  Labs (all labs ordered are listed, but only abnormal results are displayed) Labs Reviewed - No data to display  EKG None  Radiology Dg Chest 2 View  Result Date: 09/28/2019 CLINICAL DATA:  Rib pain after twisting injury. EXAM: CHEST - 2 VIEW COMPARISON:  01/22/2018 FINDINGS: Midline trachea.  Normal heart size and mediastinal contours. Sharp costophrenic angles.  No pneumothorax.  Clear lungs. IMPRESSION: No active cardiopulmonary disease. Electronically Signed   By: Abigail Miyamoto M.D.   On: 09/28/2019 19:05    Procedures Procedures (including critical care time)  Medications Ordered in ED Medications - No data to display   Initial Impression / Assessment and Plan / ED Course  I have reviewed the triage vital signs and the nursing notes.  Pertinent labs & imaging results  that were available during my care of the patient were reviewed by me and considered in my medical decision making (see chart for details).  48 y.o. M here with right rib pain after picking up heavy object while leaning over a box.  He reports feeling 3 pops, now pain with breathing and movement.  He is afebrile, non-toxic.  Pain localized to right upper anterior/lateral ribs without acute deformity.  Lung sounds clear.  Rib films negative.  Will treat symptomatically with pain control and incentive spirometry. Can follow-up outpatient as needed.  Return here for any new/acute changes.  Final Clinical Impressions(s) / ED Diagnoses   Final diagnoses:  Rib pain    ED Discharge Orders         Ordered    traMADol (ULTRAM) 50 MG tablet  Every 6 hours PRN     09/28/19 2326    ibuprofen (ADVIL) 800 MG tablet  3 times daily     09/28/19 2326           Larene Pickett, PA-C 09/28/19 2329    Drenda Freeze, MD 10/01/19 1505

## 2020-01-20 ENCOUNTER — Ambulatory Visit (HOSPITAL_COMMUNITY)
Admission: EM | Admit: 2020-01-20 | Discharge: 2020-01-20 | Disposition: A | Discharge status: Home / Self Care | Payer: BC Managed Care – PPO | Attending: Family Medicine | Admitting: Family Medicine

## 2020-01-20 ENCOUNTER — Encounter (HOSPITAL_COMMUNITY): Payer: Self-pay

## 2020-01-20 ENCOUNTER — Other Ambulatory Visit: Payer: Self-pay

## 2020-01-20 DIAGNOSIS — M545 Low back pain, unspecified: Secondary | ICD-10-CM

## 2020-01-20 DIAGNOSIS — G629 Polyneuropathy, unspecified: Secondary | ICD-10-CM

## 2020-01-20 MED ORDER — PREDNISONE 10 MG (21) PO TBPK: ORAL_TABLET | Freq: Every day | ORAL | 0 refills | Status: AC

## 2020-01-20 MED ORDER — KETOROLAC TROMETHAMINE 60 MG/2ML IM SOLN
60.0000 mg | Freq: Once | INTRAMUSCULAR | Status: AC
  Administered 2020-01-20: 60 mg via INTRAMUSCULAR

## 2020-01-20 MED ORDER — CYCLOBENZAPRINE HCL 10 MG PO TABS: 10.0000 mg | ORAL_TABLET | Freq: Two times a day (BID) | ORAL | 0 refills | Status: DC | PRN

## 2020-01-20 MED ORDER — MELOXICAM 7.5 MG PO TABS: 15.0000 mg | ORAL_TABLET | Freq: Every day | ORAL | 0 refills | Status: AC

## 2020-01-20 MED ORDER — DEXAMETHASONE SODIUM PHOSPHATE 10 MG/ML IJ SOLN
10.0000 mg | Freq: Once | INTRAMUSCULAR | Status: AC
  Administered 2020-01-20: 10 mg via INTRAMUSCULAR

## 2020-01-20 MED ORDER — KETOROLAC TROMETHAMINE 60 MG/2ML IM SOLN
INTRAMUSCULAR | Status: AC
  Filled 2020-01-20: qty 2

## 2020-01-20 MED ORDER — DEXAMETHASONE SODIUM PHOSPHATE 10 MG/ML IJ SOLN
INTRAMUSCULAR | Status: AC
  Filled 2020-01-20: qty 1

## 2020-01-20 NOTE — Discharge Instructions (Addendum)
Take the ibuprofen as prescribed.  Rest and elevate your hand.  Apply ice packs 2-3 times a day for up to 20 minutes each.  Wear the Ace wrap as needed for comfort.    Follow up with your primary care provider or an orthopedist if you symptoms continue or worsen;  Or if you develop new symptoms, such as numbness, tingling, or weakness.    

## 2020-01-20 NOTE — ED Provider Notes (Signed)
[facility]    CSN: [id number] Arrival date & time: 01/20/20  1109      History   Chief Complaint Chief Complaint  Patient presents with  . Back Pain    HPI Ernest Williams is a 49 y.o. male.   Reports low back pain with radiation down his left leg, reports that his left foot is burning today.  Reports that this has been going on for the last 4 days but has gotten worse today.  Has made no attempt to treat at home.  History significant for sciatica in the past.  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  He thinks this may be exacerbating his condition.  Denies headache, body aches, chills, fever, rash, nausea, vomiting, diarrhea, other symptoms.  ROS per HPI  The history is provided by the patient.    Past Medical History:  Diagnosis Date  . Hypertension     There are no problems to display for this patient.   History reviewed. No pertinent surgical history.     Home Medications    Prior to Admission medications   Medication Sig Start Date End Date Taking? Authorizing Provider  cyclobenzaprine (FLEXERIL) 10 MG tablet Take 1 tablet (10 mg total) by mouth 2 (two) times daily as needed for muscle spasms. 01/20/20   Moshe Cipro, NP  ibuprofen (ADVIL) 800 MG tablet Take 1 tablet (800 mg total) by mouth 3 (three) times daily. 09/28/19   Garlon Hatchet, PA-C  lisinopril (ZESTRIL) 20 MG tablet Take 1 tablet (20 mg total) by mouth daily. 05/06/19   Lamptey, Britta Mccreedy, MD  meloxicam (MOBIC) 7.5 MG tablet Take 2 tablets (15 mg total) by mouth daily for 15 days. 01/20/20 02/04/20  [facility]he Cipro, NP  naproxen (NAPROSYN) 375 MG tablet Take 1 tablet (375 mg total) by mouth 2 (two) times daily as needed. 08/27/19   Elvina Sidle, MD  predniSONE (STERAPRED UNI-PAK 21 TAB) 10 MG (21) TBPK tablet Take by mouth daily for 6 days. Take 6 tablets on day 1, 5 tablets on day 2, 4 tablets on day 3, 3 tablets on day 4, 2 tablets on day 5, 1 tablet on day  6 01/20/20 01/26/20  [facility]he Cipro, NP  traMADol (ULTRAM) 50 MG tablet Take 1 tablet (50 mg total) by mouth every 6 (six) hours as needed. 09/28/19   Garlon Hatchet, PA-C    Family History Family History  Problem Relation Age of Onset  . Diabetes Mother   . Healthy Father     Social History Social History   Tobacco Use  . Smoking status: Former Smoker    Types: Cigarettes    Quit date: 2017    Years since quitting: 4.1  . Smokeless tobacco: Never Used  Substance Use Topics  . Alcohol use: Yes    Comment: Socially  . Drug use: Yes    Types: Marijuana     Allergies   Tomato, Pear, and Strawberry (diagnostic)   Review of Systems Review of Systems   Physical Exam Triage Vital Signs ED Triage Vitals [01/20/20 1214]  Enc Vitals Group     BP (!) 150/99     Pulse Rate 97     Resp 16     Temp 98.8 F (37.1 C)     Temp Source Oral     SpO2 98 %     Weight      Height      Head Circumference  Peak Flow      Pain Score      Pain Loc      Pain Edu?      Excl. in [place]?    No data found.  Updated Vital Signs BP (!) 150/99 (BP Location: Left Arm)   Pulse 97   Temp 98.8 F (37.1 C) (Oral)   Resp 16   Wt 240 lb (108.9 kg)   SpO2 98%   BMI 30.81 kg/m   Visual Acuity Right Eye Distance:   Left Eye Distance:   Bilateral Distance:    Right Eye Near:   Left Eye Near:    Bilateral Near:     Physical Exam Vitals and nursing note reviewed.  Constitutional:      General: He is not in acute distress.    Appearance: Normal appearance. He is well-developed and normal weight.  HENT:     Head: Normocephalic and atraumatic.  Eyes:     Conjunctiva/sclera: Conjunctivae normal.  Cardiovascular:     Rate and Rhythm: Normal rate and regular rhythm.     Heart sounds: Normal heart sounds. No murmur.  Pulmonary:     Effort: Pulmonary effort is normal. No respiratory distress.     Breath sounds: Normal breath sounds.  Abdominal:     General: Bowel sounds are  normal. There is no distension.     Palpations: Abdomen is soft. There is no mass.     Tenderness: There is no abdominal tenderness. There is no guarding or rebound.     Hernia: No hernia is present.  Musculoskeletal:        General: Tenderness present. No swelling, deformity or signs of injury.     Cervical back: Neck supple.     Right lower leg: No edema.     Left lower leg: No edema.     Comments: Low back muscle tenderness, positive straight leg raise on the left side.  Skin:    General: Skin is warm and dry.     Capillary Refill: Capillary refill takes less than 2 seconds.  Neurological:     General: No focal deficit present.     Mental Status: He is alert and oriented to person, place, and time.  Psychiatric:        Mood and Affect: Mood normal.        Behavior: Behavior normal.      UC Treatments / Results  Labs (all labs ordered are listed, but only abnormal results are displayed) Labs Reviewed - No data to display  EKG   Radiology No results found.  Procedures Procedures (including critical care time)  Medications Ordered in UC Medications  dexamethasone (DECADRON) injection 10 mg (10 mg Intramuscular Given 01/20/20 1235)  ketorolac (TORADOL) injection 60 mg (60 mg Intramuscular Given 01/20/20 1236)    Initial Impression / Assessment and Plan / UC Course  I have reviewed the triage vital signs and the nursing notes.  Pertinent labs & imaging results that were available during my care of the patient were reviewed by me and considered in my medical decision making (see chart for details).     Low back pain with left-sided sciatica.  Decadron 10 mg IM given in office today to help with inflammation, Toradol 60 mg IM given in office today to help with pain.  Patient in meloxicam to take daily as needed for inflammation.  Also send in Flexeril 10 mg twice daily as needed for muscle spasms.  Do not drink or drive,  or operate heavy machinery while taking this  medication as it can be sedating.  Also sent in steroid taper to help decrease inflammation.  Instructed patient to follow-up with primary care this office as needed.  Instructed that if symptoms are persisting he may want to see orthopedics or back specialist.  Instructed to go to the ER for loss of sensation, loss of bowel or bladder control, other concerning symptoms. Final Clinical Impressions(s) / UC Diagnoses   Final diagnoses:  Acute bilateral low back pain without sciatica  Neuropathy     Discharge Instructions     Take the ibuprofen as prescribed.  Rest and elevate your hand.  Apply ice packs 2-3 times a day for up to 20 minutes each.  Wear the Ace wrap as needed for comfort.    Follow up with your primary care provider or an orthopedist if you symptoms continue or worsen;  Or if you develop new symptoms, such as numbness, tingling, or weakness.       ED Prescriptions    Medication Sig Dispense Auth. Provider   cyclobenzaprine (FLEXERIL) 10 MG tablet Take 1 tablet (10 mg total) by mouth 2 (two) times daily as needed for muscle spasms. 20 tablet Faustino Congress, NP   meloxicam (MOBIC) 7.5 MG tablet Take 2 tablets (15 mg total) by mouth daily for 15 days. 30 tablet Faustino Congress, NP   predniSONE (STERAPRED UNI-PAK 21 TAB) 10 MG (21) TBPK tablet Take by mouth daily for 6 days. Take 6 tablets on day 1, 5 tablets on day 2, 4 tablets on day 3, 3 tablets on day 4, 2 tablets on day 5, 1 tablet on day 6 21 tablet Faustino Congress, NP     PDMP not reviewed this encounter.   Faustino Congress, NP 01/21/20 1129

## 2020-01-20 NOTE — ED Triage Notes (Signed)
Pt states the neck and back pain  X 4 days. Pt states his lower back hurts the worst.

## 2020-03-25 ENCOUNTER — Ambulatory Visit (HOSPITAL_COMMUNITY)
Admission: EM | Admit: 2020-03-25 | Discharge: 2020-03-25 | Disposition: A | Discharge status: Home / Self Care | Payer: BC Managed Care – PPO | Attending: Emergency Medicine | Admitting: Emergency Medicine

## 2020-03-25 ENCOUNTER — Other Ambulatory Visit: Payer: Self-pay

## 2020-03-25 ENCOUNTER — Encounter (HOSPITAL_COMMUNITY): Payer: Self-pay | Admitting: *Deleted

## 2020-03-25 ENCOUNTER — Ambulatory Visit (INDEPENDENT_AMBULATORY_CARE_PROVIDER_SITE_OTHER): Payer: BC Managed Care – PPO

## 2020-03-25 DIAGNOSIS — S52572A Other intraarticular fracture of lower end of left radius, initial encounter for closed fracture: Secondary | ICD-10-CM | POA: Diagnosis not present

## 2020-03-25 DIAGNOSIS — M25532 Pain in left wrist: Secondary | ICD-10-CM | POA: Diagnosis not present

## 2020-03-25 DIAGNOSIS — S52612A Displaced fracture of left ulna styloid process, initial encounter for closed fracture: Secondary | ICD-10-CM | POA: Diagnosis not present

## 2020-03-25 DIAGNOSIS — S62102A Fracture of unspecified carpal bone, left wrist, initial encounter for closed fracture: Secondary | ICD-10-CM

## 2020-03-25 HISTORY — DX: Polyneuropathy, unspecified: G62.9

## 2020-03-25 HISTORY — DX: Fracture of unspecified carpal bone, left wrist, initial encounter for closed fracture: S62.102A

## 2020-03-25 MED ORDER — IBUPROFEN 800 MG PO TABS: 800.0000 mg | ORAL_TABLET | Freq: Three times a day (TID) | ORAL | 0 refills | Status: DC

## 2020-03-25 MED ORDER — HYDROCODONE-ACETAMINOPHEN 5-325 MG PO TABS: 2.0000 | ORAL_TABLET | ORAL | 0 refills | Status: DC | PRN

## 2020-03-25 MED ORDER — IBUPROFEN 800 MG PO TABS
800.0000 mg | ORAL_TABLET | Freq: Once | ORAL | Status: AC
  Administered 2020-03-25: 800 mg via ORAL

## 2020-03-25 MED ORDER — IBUPROFEN 800 MG PO TABS
ORAL_TABLET | ORAL | Status: AC
  Filled 2020-03-25: qty 1

## 2020-03-25 NOTE — ED Triage Notes (Signed)
Pt reports trying to catch his son's and his fall at the same time this afternoon, and caught both of their weights with left wrist.  C/O significant left wrist pain and numbness to left hand.  All digits warm, pink with prompt cap refill.

## 2020-03-25 NOTE — Discharge Instructions (Signed)
Please go to [facility] tomorrow at 10 AM Follow-up with Dr. Eulah Pont Monday at 830 Use anti-inflammatories for pain/swelling. You may take up to 800 mg Ibuprofen every 8 hours with food. You may supplement Ibuprofen with Tylenol [phone] mg every 8 hours.  Hydrocodone for severe pain/nighttime pain- do not drive/work after taking

## 2020-03-25 NOTE — ED Notes (Signed)
Ortho tech paged  

## 2020-03-25 NOTE — ED Notes (Addendum)
P 

## 2020-03-25 NOTE — Progress Notes (Signed)
Orthopedic Tech Progress Note Patient Details:  Ernest Williams 11-05-1971 [id number]  Ortho Devices Type of Ortho Device: Sugartong splint Ortho Device/Splint Location: ULE Ortho Device/Splint Interventions: Application   Post Interventions Patient Tolerated: Well Instructions Provided: Care of device    A  03/25/2020, 5:10 PM

## 2020-03-26 NOTE — ED Provider Notes (Signed)
[facility]    CSN: [id number] Arrival date & time: 03/25/20  1451      History   Chief Complaint Chief Complaint  Patient presents with  . Wrist Injury    HPI Ramello Cordial is a 49 y.o. male history of hypertension presenting today for evaluation of left wrist injury.  Patient reports that this afternoon he was attempting to catch his son and it in doing so he landed on his left wrist.  He has had a lot of pain and swelling.  Reports remote history of prior fracture 20 years ago that did not require surgery.  He reports decreased sensation.  HPI  Past Medical History:  Diagnosis Date  . Hypertension   . Peripheral neuropathy     There are no problems to display for this patient.   Past Surgical History:  Procedure Laterality Date  . KNEE ARTHROSCOPY W/ ORIF         Home Medications    Prior to Admission medications   Medication Sig Start Date End Date Taking? Authorizing Provider  lisinopril (ZESTRIL) 20 MG tablet Take 1 tablet (20 mg total) by mouth daily. 05/06/19  Yes Lamptey, Britta Mccreedy, MD  HYDROcodone-acetaminophen (NORCO/VICODIN) 5-325 MG tablet Take 2 tablets by mouth every 4 (four) hours as needed. 03/25/20   Wieters, Hallie C, PA-C  ibuprofen (ADVIL) 800 MG tablet Take 1 tablet (800 mg total) by mouth 3 (three) times daily. 03/25/20   Wieters, Junius Creamer, PA-C    Family History Family History  Problem Relation Age of Onset  . Diabetes Mother   . Healthy Father     Social History Social History   Tobacco Use  . Smoking status: Former Smoker    Types: Cigarettes    Quit date: 2017    Years since quitting: 4.3  . Smokeless tobacco: Never Used  Substance Use Topics  . Alcohol use: Yes    Comment: Socially  . Drug use: Yes    Types: Marijuana     Allergies   Tomato, Pear, and Strawberry (diagnostic)   Review of Systems Review of Systems  Constitutional: Negative for fatigue and fever.  Eyes: Negative for redness, itching and  visual disturbance.  Respiratory: Negative for shortness of breath.   Cardiovascular: Negative for chest pain and leg swelling.  Gastrointestinal: Negative for nausea and vomiting.  Musculoskeletal: Positive for arthralgias and joint swelling. Negative for myalgias.  Skin: Negative for color change, rash and wound.  Neurological: Negative for dizziness, syncope, weakness, light-headedness and headaches.     Physical Exam Triage Vital Signs ED Triage Vitals  Enc Vitals Group     BP 03/25/20 1543 (!) 151/102     Pulse Rate 03/25/20 1543 (!) 114     Resp 03/25/20 1543 18     Temp 03/25/20 1543 98.6 F (37 C)     Temp Source 03/25/20 1543 Oral     SpO2 03/25/20 1543 100 %     Weight --      Height --      Head Circumference --      Peak Flow --      Pain Score 03/25/20 1552 9     Pain Loc --      Pain Edu? --      Excl. in GC? --    No data found.  Updated Vital Signs BP (!) 151/102 (BP Location: Right Arm)   Pulse (!) 114   Temp 98.6 F (37 C) (Oral)  Resp 18   SpO2 100%   Visual Acuity Right Eye Distance:   Left Eye Distance:   Bilateral Distance:    Right Eye Near:   Left Eye Near:    Bilateral Near:     Physical Exam Vitals and nursing note reviewed.  Constitutional:      Appearance: He is well-developed.     Comments: No acute distress  HENT:     Head: Normocephalic and atraumatic.     Nose: Nose normal.  Eyes:     Conjunctiva/sclera: Conjunctivae normal.  Cardiovascular:     Rate and Rhythm: Normal rate.  Pulmonary:     Effort: Pulmonary effort is normal. No respiratory distress.  Abdominal:     General: There is no distension.  Musculoskeletal:        General: Normal range of motion.     Cervical back: Neck supple.     Comments: Left wrist: Obvious swelling and more prominent over distal radius of left wrist, tender to palpation diffusely about wrist, radial pulse 2+ Patient is able to move all 5 fingers, although does elicit pain and in doing  so avoids full flexion, reports decreased sensation distally, cap refill brisk  Left elbow: Nontender to palpation of elbow, full active range of motion  Skin:    General: Skin is warm and dry.  Neurological:     Mental Status: He is alert and oriented to person, place, and time.      UC Treatments / Results  Labs (all labs ordered are listed, but only abnormal results are displayed) Labs Reviewed - No data to display  EKG   Radiology DG Wrist Complete Left  Result Date: 03/25/2020 CLINICAL DATA:  Fall with left wrist pain and swelling. EXAM: LEFT WRIST - COMPLETE 3+ VIEW COMPARISON:  None. FINDINGS: Comminuted fracture of the distal radius. There is a transverse fracture component with additional fractures that extend to the distal radial articular surface, between the radial and lunate facets. A component of the fracture is displaced in a volar direction by 1 cm, with significant dorsal angulation. There is also dorsal angulation of the distal radial articular surface measuring 26 degrees. There is an associated mildly displaced ulnar styloid fracture. No dislocation. There is surrounding soft tissue swelling. IMPRESSION: 1. Comminuted, dorsally impacted intra-articular fracture of the distal radius with dorsal angulation of the distal radial articular surface of 26 degrees. Associated mildly displaced ulnar styloid fracture. No dislocation. Electronically Signed   By: Lajean Manes M.D.   On: 03/25/2020 16:24    Procedures Procedures (including critical care time)  Medications Ordered in UC Medications  ibuprofen (ADVIL) tablet 800 mg (800 mg Oral Given 03/25/20 1600)    Initial Impression / Assessment and Plan / UC Course  I have reviewed the triage vital signs and the nursing notes.  Pertinent labs & imaging results that were available during my care of the patient were reviewed by me and considered in my medical decision making (see chart for details).     Comminuted and  displaced distal radius fracture, discussed with Dr. Santo Held on-call, recommended sugar tong, follow-up with [facility] 10 AM in the morning for possible reduction, likely will need surgery.  Tylenol and ibuprofen for pain, provided hydrocodone for severe pain.  Discussed strict return precautions. Patient verbalized understanding and is agreeable with plan.  Final Clinical Impressions(s) / UC Diagnoses   Final diagnoses:  Other closed intra-articular fracture of distal end of left radius, initial encounter  Discharge Instructions     Please go to [facility] tomorrow at 10 AM Follow-up with Dr. Eulah Pont Monday at 830 Use anti-inflammatories for pain/swelling. You may take up to 800 mg Ibuprofen every 8 hours with food. You may supplement Ibuprofen with Tylenol [id number] mg every 8 hours.  Hydrocodone for severe pain/nighttime pain- do not drive/work after taking   ED Prescriptions    Medication Sig Dispense Auth. Provider   ibuprofen (ADVIL) 800 MG tablet Take 1 tablet (800 mg total) by mouth 3 (three) times daily. 21 tablet Wieters, Hallie C, PA-C   HYDROcodone-acetaminophen (NORCO/VICODIN) 5-325 MG tablet Take 2 tablets by mouth every 4 (four) hours as needed. 10 tablet Wieters, Baldwin C, PA-C     I have reviewed the PDMP during this encounter.   Lew Dawes, [place] 03/26/20 [phone]

## 2020-03-27 DIAGNOSIS — M25532 Pain in left wrist: Secondary | ICD-10-CM | POA: Diagnosis not present

## 2020-03-30 ENCOUNTER — Encounter (HOSPITAL_BASED_OUTPATIENT_CLINIC_OR_DEPARTMENT_OTHER): Payer: Self-pay | Admitting: Orthopedic Surgery

## 2020-03-30 ENCOUNTER — Other Ambulatory Visit (HOSPITAL_COMMUNITY)
Admission: RE | Admit: 2020-03-30 | Discharge: 2020-03-30 | Disposition: A | Discharge status: Home / Self Care | Payer: BC Managed Care – PPO | Source: Ambulatory Visit | Attending: Orthopedic Surgery | Admitting: Orthopedic Surgery

## 2020-03-30 ENCOUNTER — Other Ambulatory Visit: Payer: Self-pay

## 2020-03-30 DIAGNOSIS — Z01812 Encounter for preprocedural laboratory examination: Secondary | ICD-10-CM | POA: Insufficient documentation

## 2020-03-30 DIAGNOSIS — Z20822 Contact with and (suspected) exposure to covid-19: Secondary | ICD-10-CM | POA: Insufficient documentation

## 2020-03-30 LAB — SARS CORONAVIRUS 2 (TAT 6-24 HRS): SARS Coronavirus 2: NEGATIVE

## 2020-03-30 NOTE — Progress Notes (Signed)
Spoke w/ via phone for pre-op interview--- PT Lab needs dos----  Istat             Lab results------ current ekg , dated 09-28-2019, in epic/ chart COVID test ------ 03-30-2020 @ 1130 Arrive at ------- 1315 NPO after ------ MN w/ exception clear liquids until 0915 then nothing by mouth (no cream/ milk products) Medications to take morning of surgery ----- NONE Diabetic medication ----- n/a Patient Special Instructions ----- n/a Pre-Op special Istructions ----- pre-op orders pending , case just added on this morning Patient verbalized understanding of instructions that were given at this phone interview. Patient denies shortness of breath, chest pain, fever, cough a this phone interview.

## 2020-03-31 ENCOUNTER — Ambulatory Visit (HOSPITAL_BASED_OUTPATIENT_CLINIC_OR_DEPARTMENT_OTHER): Payer: BC Managed Care – PPO | Admitting: Anesthesiology

## 2020-03-31 ENCOUNTER — Encounter (HOSPITAL_BASED_OUTPATIENT_CLINIC_OR_DEPARTMENT_OTHER)
Admission: RE | Disposition: A | Discharge status: Home / Self Care | Payer: Self-pay | Source: Home / Self Care | Attending: Orthopedic Surgery

## 2020-03-31 ENCOUNTER — Ambulatory Visit (HOSPITAL_BASED_OUTPATIENT_CLINIC_OR_DEPARTMENT_OTHER)
Admission: RE | Admit: 2020-03-31 | Discharge: 2020-03-31 | Disposition: A | Discharge status: Home / Self Care | Payer: BC Managed Care – PPO | Attending: Orthopedic Surgery | Admitting: Orthopedic Surgery

## 2020-03-31 ENCOUNTER — Other Ambulatory Visit: Payer: Self-pay

## 2020-03-31 DIAGNOSIS — I10 Essential (primary) hypertension: Secondary | ICD-10-CM | POA: Diagnosis not present

## 2020-03-31 DIAGNOSIS — S52502A Unspecified fracture of the lower end of left radius, initial encounter for closed fracture: Secondary | ICD-10-CM | POA: Diagnosis not present

## 2020-03-31 DIAGNOSIS — W19XXXA Unspecified fall, initial encounter: Secondary | ICD-10-CM | POA: Diagnosis not present

## 2020-03-31 DIAGNOSIS — S52572A Other intraarticular fracture of lower end of left radius, initial encounter for closed fracture: Secondary | ICD-10-CM | POA: Diagnosis not present

## 2020-03-31 DIAGNOSIS — G8918 Other acute postprocedural pain: Secondary | ICD-10-CM | POA: Diagnosis not present

## 2020-03-31 DIAGNOSIS — S62102A Fracture of unspecified carpal bone, left wrist, initial encounter for closed fracture: Secondary | ICD-10-CM | POA: Diagnosis not present

## 2020-03-31 HISTORY — DX: Spondylosis, unspecified: M47.9

## 2020-03-31 HISTORY — DX: Personal history of traumatic brain injury: Z87.820

## 2020-03-31 HISTORY — DX: Family history of other specified conditions: Z84.89

## 2020-03-31 HISTORY — DX: Presence of spectacles and contact lenses: Z97.3

## 2020-03-31 HISTORY — PX: ORIF WRIST FRACTURE: SHX2133

## 2020-03-31 LAB — POCT I-STAT, CHEM 8
BUN: 9 mg/dL (ref 6–20)
Calcium, Ion: 1.29 mmol/L (ref 1.15–1.40)
Chloride: 101 mmol/L (ref 98–111)
Creatinine, Ser: 0.8 mg/dL (ref 0.61–1.24)
Glucose, Bld: 123 mg/dL — ABNORMAL HIGH (ref 70–99)
HCT: 47 % (ref 39.0–52.0)
Hemoglobin: 16 g/dL (ref 13.0–17.0)
Potassium: 4.2 mmol/L (ref 3.5–5.1)
Sodium: 139 mmol/L (ref 135–145)
TCO2: 26 mmol/L (ref 22–32)

## 2020-03-31 SURGERY — OPEN REDUCTION INTERNAL FIXATION (ORIF) WRIST FRACTURE
Anesthesia: General | Site: Wrist | Laterality: Left

## 2020-03-31 MED ORDER — FENTANYL CITRATE (PF) 100 MCG/2ML IJ SOLN: 25.0000 ug | INTRAMUSCULAR | Status: DC | PRN

## 2020-03-31 MED ORDER — FENTANYL CITRATE (PF) 100 MCG/2ML IJ SOLN
INTRAMUSCULAR | Status: AC
  Filled 2020-03-31: qty 2

## 2020-03-31 MED ORDER — KETOROLAC TROMETHAMINE 30 MG/ML IJ SOLN: 30.0000 mg | Freq: Once | INTRAMUSCULAR | Status: DC | PRN

## 2020-03-31 MED ORDER — ACETAMINOPHEN 500 MG PO TABS
ORAL_TABLET | ORAL | Status: AC
  Filled 2020-03-31: qty 2

## 2020-03-31 MED ORDER — CEFAZOLIN SODIUM-DEXTROSE 2-4 GM/100ML-% IV SOLN
INTRAVENOUS | Status: AC
  Filled 2020-03-31: qty 100

## 2020-03-31 MED ORDER — MIDAZOLAM HCL 2 MG/2ML IJ SOLN
INTRAMUSCULAR | Status: AC
  Filled 2020-03-31: qty 2

## 2020-03-31 MED ORDER — ONDANSETRON HCL 4 MG/2ML IJ SOLN
INTRAMUSCULAR | Status: AC
  Filled 2020-03-31: qty 2

## 2020-03-31 MED ORDER — FENTANYL CITRATE (PF) 100 MCG/2ML IJ SOLN
25.0000 ug | INTRAMUSCULAR | Status: DC | PRN
  Administered 2020-03-31 (×2): 25 ug via INTRAVENOUS

## 2020-03-31 MED ORDER — ACETAMINOPHEN 500 MG PO TABS
1000.0000 mg | ORAL_TABLET | Freq: Once | ORAL | Status: AC
  Administered 2020-03-31: 1000 mg via ORAL

## 2020-03-31 MED ORDER — PROPOFOL 10 MG/ML IV BOLUS
INTRAVENOUS | Status: AC
  Filled 2020-03-31: qty 20

## 2020-03-31 MED ORDER — ONDANSETRON HCL 4 MG/2ML IJ SOLN
INTRAMUSCULAR | Status: DC | PRN
  Administered 2020-03-31: 4 mg via INTRAVENOUS

## 2020-03-31 MED ORDER — FENTANYL CITRATE (PF) 100 MCG/2ML IJ SOLN
100.0000 ug | Freq: Once | INTRAMUSCULAR | Status: AC
  Administered 2020-03-31: 100 ug via INTRAVENOUS

## 2020-03-31 MED ORDER — LIDOCAINE 2% (20 MG/ML) 5 ML SYRINGE
INTRAMUSCULAR | Status: DC | PRN
  Administered 2020-03-31: 100 mg via INTRAVENOUS

## 2020-03-31 MED ORDER — DEXAMETHASONE SODIUM PHOSPHATE 10 MG/ML IJ SOLN
INTRAMUSCULAR | Status: DC | PRN
  Administered 2020-03-31: 10 mg via INTRAVENOUS

## 2020-03-31 MED ORDER — LISINOPRIL 20 MG PO TABS
20.0000 mg | ORAL_TABLET | Freq: Once | ORAL | Status: AC
  Administered 2020-03-31: 20 mg via ORAL
  Filled 2020-03-31: qty 1

## 2020-03-31 MED ORDER — ROPIVACAINE HCL 5 MG/ML IJ SOLN
INTRAMUSCULAR | Status: DC | PRN
Start: 2020-03-31 — End: 2020-03-31
  Administered 2020-03-31: 30 mL via PERINEURAL

## 2020-03-31 MED ORDER — MIDAZOLAM HCL 2 MG/2ML IJ SOLN
2.0000 mg | Freq: Once | INTRAMUSCULAR | Status: AC
  Administered 2020-03-31: 2 mg via INTRAVENOUS

## 2020-03-31 MED ORDER — LACTATED RINGERS IV SOLN: INTRAVENOUS | Status: DC

## 2020-03-31 MED ORDER — CHLORHEXIDINE GLUCONATE 4 % EX LIQD: 60.0000 mL | Freq: Once | CUTANEOUS | Status: DC

## 2020-03-31 MED ORDER — DEXAMETHASONE SODIUM PHOSPHATE 10 MG/ML IJ SOLN
INTRAMUSCULAR | Status: AC
  Filled 2020-03-31: qty 1

## 2020-03-31 MED ORDER — LIDOCAINE 2% (20 MG/ML) 5 ML SYRINGE
INTRAMUSCULAR | Status: AC
  Filled 2020-03-31: qty 5

## 2020-03-31 MED ORDER — CEFAZOLIN SODIUM-DEXTROSE 2-4 GM/100ML-% IV SOLN
2.0000 g | INTRAVENOUS | Status: AC
  Administered 2020-03-31: 2 g via INTRAVENOUS

## 2020-03-31 MED ORDER — PROPOFOL 500 MG/50ML IV EMUL
INTRAVENOUS | Status: DC | PRN
  Administered 2020-03-31: 200 mg via INTRAVENOUS

## 2020-03-31 MED ORDER — ONDANSETRON HCL 4 MG/2ML IJ SOLN
INTRAMUSCULAR | Status: AC
  Filled 2020-03-31: qty 4

## 2020-03-31 SURGICAL SUPPLY — 72 items
BIT DRILL 2.2 SS TIBIAL (BIT) ×3 IMPLANT
BLADE SURG 15 STRL LF DISP TIS (BLADE) ×2 IMPLANT
BLADE SURG 15 STRL SS (BLADE) ×4
BNDG ELASTIC 2 VLCR STRL LF (GAUZE/BANDAGES/DRESSINGS) ×3 IMPLANT
BNDG ELASTIC 3X5.8 VLCR STR LF (GAUZE/BANDAGES/DRESSINGS) ×3 IMPLANT
BNDG ELASTIC 4X5.8 VLCR STR LF (GAUZE/BANDAGES/DRESSINGS) ×3 IMPLANT
BNDG ESMARK 4X9 LF (GAUZE/BANDAGES/DRESSINGS) ×3 IMPLANT
CHLORAPREP W/TINT 26 (MISCELLANEOUS) ×3 IMPLANT
CLOSURE STERI-STRIP 1/2X4 (GAUZE/BANDAGES/DRESSINGS) ×1
CLSR STERI-STRIP ANTIMIC 1/2X4 (GAUZE/BANDAGES/DRESSINGS) ×2 IMPLANT
CORD BIPOLAR FORCEPS 12FT (ELECTRODE) ×3 IMPLANT
COVER BACK TABLE 60X90IN (DRAPES) ×3 IMPLANT
COVER WAND RF STERILE (DRAPES) ×3 IMPLANT
CUFF TOURN SGL QUICK 18X4 (TOURNIQUET CUFF) IMPLANT
CUFF TOURN SGL QUICK 24 (TOURNIQUET CUFF)
CUFF TRNQT CYL 24X4X16.5-23 (TOURNIQUET CUFF) IMPLANT
DECANTER SPIKE VIAL GLASS SM (MISCELLANEOUS) IMPLANT
DRAPE EXTREMITY T 121X128X90 (DISPOSABLE) ×3 IMPLANT
DRAPE IMP U-DRAPE 54X76 (DRAPES) ×3 IMPLANT
DRAPE OEC MINIVIEW 54X84 (DRAPES) ×3 IMPLANT
DRAPE SURG 17X23 STRL (DRAPES) IMPLANT
DRSG EMULSION OIL 3X3 NADH (GAUZE/BANDAGES/DRESSINGS) ×3 IMPLANT
GAUZE SPONGE 4X4 12PLY STRL (GAUZE/BANDAGES/DRESSINGS) ×3 IMPLANT
GAUZE XEROFORM 1X8 LF (GAUZE/BANDAGES/DRESSINGS) IMPLANT
GLOVE BIO SURGEON STRL SZ7.5 (GLOVE) ×6 IMPLANT
GLOVE BIOGEL PI IND STRL 8 (GLOVE) ×2 IMPLANT
GLOVE BIOGEL PI INDICATOR 8 (GLOVE) ×4
GOWN STRL REUS W/ TWL LRG LVL3 (GOWN DISPOSABLE) ×2 IMPLANT
GOWN STRL REUS W/TWL LRG LVL3 (GOWN DISPOSABLE) ×4
K-WIRE 1.6 (WIRE) ×4
K-WIRE FX5X1.6XNS BN SS (WIRE) ×2
KWIRE FX5X1.6XNS BN SS (WIRE) ×2 IMPLANT
NEEDLE HYPO 25X1 1.5 SAFETY (NEEDLE) ×3 IMPLANT
NS IRRIG 1000ML POUR BTL (IV SOLUTION) ×3 IMPLANT
PAD CAST 3X4 CTTN HI CHSV (CAST SUPPLIES) ×1 IMPLANT
PAD CAST 4YDX4 CTTN HI CHSV (CAST SUPPLIES) ×1 IMPLANT
PADDING CAST ABS 4INX4YD NS (CAST SUPPLIES) ×2
PADDING CAST ABS COTTON 4X4 ST (CAST SUPPLIES) ×1 IMPLANT
PADDING CAST COTTON 3X4 STRL (CAST SUPPLIES) ×2
PADDING CAST COTTON 4X4 STRL (CAST SUPPLIES) ×2
PEG LOCKING SMOOTH 2.2X18 (Peg) ×6 IMPLANT
PEG LOCKING SMOOTH 2.2X20 (Screw) ×21 IMPLANT
PLATE WIDE DVR LEFT (Plate) ×3 IMPLANT
SCREW LOCK 16X2.7X 3 LD TPR (Screw) ×2 IMPLANT
SCREW LOCKING 2.7X15MM (Screw) ×3 IMPLANT
SCREW LOCKING 2.7X16 (Screw) ×4 IMPLANT
SET BASIN DAY SURGERY F.S. (CUSTOM PROCEDURE TRAY) ×3 IMPLANT
SLEEVE SCD COMPRESS KNEE MED (MISCELLANEOUS) IMPLANT
SLING ARM FOAM STRAP XLG (SOFTGOODS) ×3 IMPLANT
SPLINT FAST PLASTER 5X30 (CAST SUPPLIES)
SPLINT PLASTER CAST FAST 5X30 (CAST SUPPLIES) IMPLANT
SPLINT PLASTER CAST XFAST 3X15 (CAST SUPPLIES) IMPLANT
SPLINT PLASTER CAST XFAST 4X15 (CAST SUPPLIES) ×1 IMPLANT
SPLINT PLASTER XTRA FAST SET 4 (CAST SUPPLIES) ×2
SPLINT PLASTER XTRA FASTSET 3X (CAST SUPPLIES)
SPONGE LAP 4X18 RFD (DISPOSABLE) ×3 IMPLANT
SUCTION FRAZIER HANDLE 10FR (MISCELLANEOUS)
SUCTION TUBE FRAZIER 10FR DISP (MISCELLANEOUS) IMPLANT
SUT ETHILON 3 0 PS 1 (SUTURE) ×6 IMPLANT
SUT MON AB 2-0 CT1 36 (SUTURE) ×3 IMPLANT
SUT MON AB 4-0 PC3 18 (SUTURE) IMPLANT
SUT PROLENE 3 0 PS 2 (SUTURE) IMPLANT
SUT VIC AB 0 SH 27 (SUTURE) ×3 IMPLANT
SUT VIC AB 2-0 SH 27 (SUTURE) ×2
SUT VIC AB 2-0 SH 27XBRD (SUTURE) ×1 IMPLANT
SUT VIC AB 3-0 FS2 27 (SUTURE) IMPLANT
SYR BULB EAR ULCER 3OZ GRN STR (SYRINGE) ×3 IMPLANT
SYR CONTROL 10ML LL (SYRINGE) ×3 IMPLANT
TOWEL OR 17X26 10 PK STRL BLUE (TOWEL DISPOSABLE) ×3 IMPLANT
TUBE CONNECTING 12'X1/4 (SUCTIONS)
TUBE CONNECTING 12X1/4 (SUCTIONS) IMPLANT
UNDERPAD 30X30 (UNDERPADS AND DIAPERS) ×3 IMPLANT

## 2020-03-31 NOTE — Anesthesia Procedure Notes (Signed)
Anesthesia Procedure Image    

## 2020-03-31 NOTE — Anesthesia Procedure Notes (Signed)
Anesthesia Regional Block: Supraclavicular block   Pre-Anesthetic Checklist: ,, timeout performed, Correct Patient, Correct Site, Correct Laterality, Correct Procedure, Correct Position, site marked, Risks and benefits discussed,  Surgical consent,  Pre-op evaluation,  At surgeon's request and post-op pain management  Laterality: Left  Prep: chloraprep       Needles:  Injection technique: Single-shot  Needle Type: Echogenic Needle     Needle Length: 9cm      Additional Needles:   Procedures:,,,, ultrasound used (permanent image in chart),,,,  Narrative:  Start time: 03/31/2020 2:44 PM End time: 03/31/2020 2:52 PM Injection made incrementally with aspirations every 5 mL.  Performed by: Personally  Anesthesiologist: Eilene Ghazi, MD  Additional Notes: Patient tolerated the procedure well without complications

## 2020-03-31 NOTE — Discharge Instructions (Signed)
Keep wrist elevated with ice as much as possible to reduce pain and swelling. If needed, you may increase pain medication for the first few days post op to 2 tablets every 4 hours.  Stop as needed pain medication as soon as you are able.  Diet: As you were doing prior to hospitalization   Shower:  You have a splint on, leave the splint in place and keep the splint dry with a plastic bag.  Dressing:  You have a splint - leave the splint in place and we will change your bandages during your first follow-up appointment.    Activity:  Increase activity slowly as tolerated, but follow the weight bearing instructions below.  The rules on driving is that you can not be taking narcotics while you drive, and you must feel in control of the vehicle.    Weight Bearing:  Non weight bearing affected wrist.  Sling for comfort.   To prevent constipation: you may use a stool softener such as -  Colace (over the counter) 100 mg by mouth twice a day  Drink plenty of fluids (prune juice may be helpful) and high fiber foods Miralax (over the counter) for constipation as needed.    Itching:  If you experience itching with your medications, try taking only a single pain pill, or even half a pain pill at a time.  You can also use benadryl over the counter for itching or also to help with sleep.   Precautions:  If you experience chest pain or shortness of breath - call 911 immediately for transfer to the hospital emergency department!!  If you develop a fever greater that 101 F, purulent drainage from wound, increased redness or drainage from wound, or calf pain -- Call the office at [phone]                                         Follow- Up Appointment:  Please call for an appointment to be seen in 2 weeks [place] - [phone]  Call your surgeon if you experience:   1.  Fever over 101.0. 2.  Inability to urinate. 3.  Nausea and/or vomiting. 4.  Extreme swelling or bruising at the surgical  site. 5.  Continued bleeding from the incision. 6.  Increased pain, redness or drainage from the incision. 7.  Problems related to your pain medication. 8.  Any problems and/or concerns   Regional Anesthesia Blocks  1. Numbness or the inability to move the "blocked" extremity may last from 3-48 hours after placement. The length of time depends on the medication injected and your individual response to the medication. If the numbness is not going away after 48 hours, call your surgeon.  2. The extremity that is blocked will need to be protected until the numbness is gone and the  Strength has returned. Because you cannot feel it, you will need to take extra care to avoid injury. Because it may be weak, you may have difficulty moving it or using it. You may not know what position it is in without looking at it while the block is in effect.  3. For blocks in the legs and feet, returning to weight bearing and walking needs to be done carefully. You will need to wait until the numbness is entirely gone and the strength has returned. You should be able to move your leg and foot normally  before you try and bear weight or walk. You will need someone to be with you when you first try to ensure you do not fall and possibly risk injury.  4. Bruising and tenderness at the needle site are common side effects and will resolve in a few days.  5. Persistent numbness or new problems with movement should be communicated to the surgeon or the [facility] [phone] [facility] [phone]).

## 2020-03-31 NOTE — Interval H&P Note (Signed)
I participated in the care of this patient and agree with the above history, physical and evaluation. I performed a review of the history and a physical exam as detailed    Daniel  MD  

## 2020-03-31 NOTE — Anesthesia Procedure Notes (Signed)
Procedure Name: LMA Insertion Date/Time: 03/31/2020 3:11 PM Performed by: Briant Sites, CRNA Pre-anesthesia Checklist: Patient identified, Emergency Drugs available, Suction available and Patient being monitored Patient Re-evaluated:Patient Re-evaluated prior to induction Oxygen Delivery Method: Circle system utilized Preoxygenation: Pre-oxygenation with 100% oxygen Induction Type: IV induction Ventilation: Mask ventilation without difficulty LMA: LMA inserted LMA Size: 4.0 Number of attempts: 1 Airway Equipment and Method: Bite block Placement Confirmation: positive ETCO2 Tube secured with: Tape Dental Injury: Teeth and Oropharynx as per pre-operative assessment

## 2020-03-31 NOTE — Anesthesia Postprocedure Evaluation (Signed)
Anesthesia Post Note  Patient: Ernest Williams  Procedure(s) Performed: OPEN REDUCTION INTERNAL FIXATION (ORIF) WRIST FRACTURE (Left Wrist)     Anesthesia Post Evaluation  Last Vitals:  Vitals:   03/31/20 1730 03/31/20 1830  BP: (!) 160/112 (!) 171/104  Pulse: 71 77  Resp: 15 18  Temp:  36.4 C  SpO2: 96% 97%    Last Pain:  Vitals:   03/31/20 1830  TempSrc:   PainSc: 3                   

## 2020-03-31 NOTE — Anesthesia Preprocedure Evaluation (Addendum)
Anesthesia Evaluation  Patient identified by MRN, date of birth, ID band Patient awake    Reviewed: Allergy & Precautions, NPO status , Patient's Chart, lab work & pertinent test results  Airway Mallampati: II  TM Distance: >3 FB Neck ROM: Full    Dental no notable dental hx.    Pulmonary neg pulmonary ROS, former smoker,    Pulmonary exam normal breath sounds clear to auscultation       Cardiovascular hypertension, Pt. on medications Normal cardiovascular exam Rhythm:Regular Rate:Normal  Poorly controlled HTN   Neuro/Psych negative neurological ROS  negative psych ROS   GI/Hepatic negative GI ROS, Neg liver ROS,   Endo/Other  negative endocrine ROS  Renal/GU negative Renal ROS  negative genitourinary   Musculoskeletal negative musculoskeletal ROS (+)   Abdominal   Peds negative pediatric ROS (+)  Hematology negative hematology ROS (+)   Anesthesia Other Findings   Reproductive/Obstetrics negative OB ROS                            Anesthesia Physical Anesthesia Plan  ASA: III  Anesthesia Plan: General   Post-op Pain Management:  Regional for Post-op pain   Induction: Intravenous  PONV Risk Score and Plan: 2 and Ondansetron, Dexamethasone and Treatment may vary due to age or medical condition  Airway Management Planned: LMA  Additional Equipment:   Intra-op Plan:   Post-operative Plan: Extubation in OR  Informed Consent: I have reviewed the patients History and Physical, chart, labs and discussed the procedure including the risks, benefits and alternatives for the proposed anesthesia with the patient or authorized representative who has indicated his/her understanding and acceptance.     Dental advisory given  Plan Discussed with: CRNA and Surgeon  Anesthesia Plan Comments:        Anesthesia Quick Evaluation

## 2020-03-31 NOTE — H&P (Signed)
[facility] [address]   [address] [place] [address] [phone] A Division of [facility]                         RE: Ernest Williams, Ernest Williams   [id number]   Dec 12, 2070  03-27-20 Reason for visit: Left distal radius fracture suffered on May 15th.  He fell when playing with his kid.   History of present illness: He was seen in the emergency room and placed in a sugar-tong splint.  He took it off yesterday to shower.  He reports some numbness in his small finger, but no other abnormality.  Pain has been well controlled.    EXAMINATION: Well appearing male in no apparent distress.  Some swelling into the hand.  Neurovascularly intact in the median and radial nerve distributions.  Slight decreased sensation in his ulnar distribution.     X-RAYS: Intraarticular fracture of his distal radius with multiple fragments, 3+.  ASSESSMENT/PLAN: Distal radius fracture, 3+ fragments.  I would recommend ORIF of this.  We will set that up for this week.  I discussed the risks with him and he would like to go forward with that.    Jewel Baize.  Eulah Pont, M.D.  Electronically verified by Jewel Baize. Eulah Pont, M.D. TDM:jjh D 03-29-20 T 03-30-20

## 2020-03-31 NOTE — Transfer of Care (Signed)
Last Vitals:  Vitals Value Taken Time  BP 159/96 03/31/20 1631  Temp    Pulse 81 03/31/20 1634  Resp 19 03/31/20 1634  SpO2 98 % 03/31/20 1634  Vitals shown include unvalidated device data.  Last Pain:  Vitals:   03/31/20 1322  TempSrc: Oral      Patients Stated Pain Goal: 8 (03/31/20 1328)  Immediate Anesthesia Transfer of Care Note  Patient: Ernest Williams  Procedure(s) Performed: Procedure(s) (LRB): OPEN REDUCTION INTERNAL FIXATION (ORIF) WRIST FRACTURE (Left)  Patient Location: PACU  Anesthesia Type: General  Level of Consciousness: awake, alert  and oriented  Airway & Oxygen Therapy: Patient Spontanous Breathing and Patient connected to nasal cannula oxygen  Post-op Assessment: Report given to PACU RN and Post -op Vital signs reviewed and stable  Post vital signs: Reviewed and stable  Complications: No apparent anesthesia complications

## 2020-03-31 NOTE — Op Note (Signed)
03/31/2020  3:12 PM  PATIENT:  Ernest Williams    PRE-OPERATIVE DIAGNOSIS:  LEFT WRIST FRACTURE  POST-OPERATIVE DIAGNOSIS:  Same  PROCEDURE:  OPEN REDUCTION INTERNAL FIXATION (ORIF) WRIST FRACTURE  SURGEON:  Sheral Apley, MD  ASSISTANT: Aquilla Hacker, PA-C, he was present and scrubbed throughout the case, critical for completion in a timely fashion, and for retraction, instrumentation, and closure.   ANESTHESIA:   gen  PREOPERATIVE INDICATIONS:  Ernest Williams is a  49 y.o. male with a diagnosis of LEFT WRIST FRACTURE who failed conservative measures and elected for surgical management.    The risks benefits and alternatives were discussed with the patient preoperatively including but not limited to the risks of infection, bleeding, nerve injury, cardiopulmonary complications, the need for revision surgery, among others, and the patient was willing to proceed.  OPERATIVE IMPLANTS: DVR plate  OPERATIVE FINDINGS: multiple articular fragments  BLOOD LOSS: min  COMPLICATIONS: none  TOURNIQUET TIME: 45 min  OPERATIVE PROCEDURE:  Patient was identified in the preoperative holding area and site was marked by me He was transported to the operating theater and placed on the table in supine position taking care to pad all bony prominences. After a preincinduction time out anesthesia was induced. The left upper extremity was prepped and draped in normal sterile fashion and a pre-incision timeout was performed. He received ancef for preoperative antibiotics.   I made a 5 cm incision centered over her FCR tendon and dissected down carefully to the level of the flexor tendon sheath and incise this longitudinally and retracted the FCR radially and incised the dorsal aspect of the sheath.   I bluntly dissected the FPL muscle belly away from the brachioradialis and then sharply incised the pronator tendon from the distal radius and from the wrist capsule. I Elevated this off the bone the  fractures visible.   I released the brachioradialis from its insertion. I then debrided the fracture and performed a manual reduction.   I selected a plate and I placed it on the bone. I pinned it into place and was happy on multiple radiographic views with it's placement. I then fixed the plate distally with the locking pegs. I confirmed no articular penetration with the pegs and that none were prominent dorsally.   I then reduced the plate to the shaft improving the volar and radial tilt of her distal radius.  I was happy with the final fluoro xrays    I thoroughly irrigated the wound and closed the pronator over top of the plate and then closed the skin in layers with absorbable stitch. Sterile dressing was applied using the PACU in stable condition.  POST OPERATIVE PLAN: NWB, Splint full time. Ambulate for DVT px.

## 2020-03-31 NOTE — Progress Notes (Signed)
Assisted Dr. Rose with left, ultrasound guided, supraclavicular block. Side rails up, monitors on throughout procedure. See vital signs in flow sheet. Tolerated Procedure well. 

## 2020-04-07 DIAGNOSIS — S52502D Unspecified fracture of the lower end of left radius, subsequent encounter for closed fracture with routine healing: Secondary | ICD-10-CM | POA: Diagnosis not present

## 2020-05-03 DIAGNOSIS — S52502D Unspecified fracture of the lower end of left radius, subsequent encounter for closed fracture with routine healing: Secondary | ICD-10-CM | POA: Diagnosis not present

## 2020-05-31 ENCOUNTER — Other Ambulatory Visit: Payer: Self-pay

## 2020-05-31 ENCOUNTER — Ambulatory Visit (HOSPITAL_COMMUNITY)
Admission: EM | Admit: 2020-05-31 | Discharge: 2020-05-31 | Disposition: A | Discharge status: Home / Self Care | Payer: BC Managed Care – PPO | Attending: Family Medicine | Admitting: Family Medicine

## 2020-05-31 ENCOUNTER — Encounter (HOSPITAL_COMMUNITY): Payer: Self-pay

## 2020-05-31 DIAGNOSIS — S52502D Unspecified fracture of the lower end of left radius, subsequent encounter for closed fracture with routine healing: Secondary | ICD-10-CM | POA: Diagnosis not present

## 2020-05-31 DIAGNOSIS — M5432 Sciatica, left side: Secondary | ICD-10-CM | POA: Diagnosis not present

## 2020-05-31 DIAGNOSIS — M62838 Other muscle spasm: Secondary | ICD-10-CM

## 2020-05-31 MED ORDER — KETOROLAC TROMETHAMINE 60 MG/2ML IM SOLN
60.0000 mg | Freq: Once | INTRAMUSCULAR | Status: AC
  Administered 2020-05-31: 60 mg via INTRAMUSCULAR

## 2020-05-31 MED ORDER — MELOXICAM 7.5 MG PO TABS
7.5000 mg | ORAL_TABLET | Freq: Every day | ORAL | 0 refills | Status: DC
Start: 2020-05-31 — End: 2020-09-14

## 2020-05-31 MED ORDER — KETOROLAC TROMETHAMINE 30 MG/ML IJ SOLN
INTRAMUSCULAR | Status: AC
  Filled 2020-05-31: qty 1

## 2020-05-31 MED ORDER — KETOROLAC TROMETHAMINE 60 MG/2ML IM SOLN
INTRAMUSCULAR | Status: AC
  Filled 2020-05-31: qty 2

## 2020-05-31 MED ORDER — CYCLOBENZAPRINE HCL 10 MG PO TABS
10.0000 mg | ORAL_TABLET | Freq: Three times a day (TID) | ORAL | 0 refills | Status: DC | PRN
Start: 2020-05-31 — End: 2020-09-14

## 2020-05-31 NOTE — Discharge Instructions (Signed)
Flexeril as needed. May cause drowsiness. Please do not take if driving or drinking alcohol.  Meloxicam daily. Don't take additional ibuprofen for aleve. Take with food.  Light and regular activity as tolerated.  See exercises provided.  Heat application while active can help with muscle spasms.  Sleep with pillow under your knees.   Please follow up orthopedics for further evaluation and management.

## 2020-05-31 NOTE — ED Provider Notes (Signed)
[facility]    CSN: [id number] Arrival date & time: 05/31/20  1137      History   Chief Complaint Chief Complaint  Patient presents with  . Back Pain    HPI Chetan Mehring is a 49 y.o. male.   [place] Coldren presents with complaints of left back pain as well as left sided neck pain. No new injury. Started approximately 6 days ago. History of similar and has been seen multiple times for this in the past. Has had MRIs in the past, states he has been told he has DDD. Rotation of the neck increases left sided neck pain. Movement of left shoulder increases pain as well. Back pain radiates down to toes at times. No saddle symptoms. No loss of bladder or bowel. Has taken tylenol pm which hasn't helped. States unable to tolerate steroids. Muscle relaxers have helped in the past. Doesn't follow regularly with orthopedics.   ROS per HPI, negative if not otherwise mentioned.      Past Medical History:  Diagnosis Date  . Degeneration of spine   . Family history of adverse reaction to anesthesia    mother--- ponv  . History of concussion    09/ 2013  no loc,  no residual  . Hypertension    goes to urgent care , no regular pcp,  (stress echo 12-21-2009 in epic, normal)  . Peripheral neuropathy    per pt states told due to degenerative spine  . Wears glasses   . Wrist fracture, closed, left, initial encounter 03/25/2020    There are no problems to display for this patient.   Past Surgical History:  Procedure Laterality Date  . NO PAST SURGERIES    . ORIF WRIST FRACTURE Left 03/31/2020   Procedure: OPEN REDUCTION INTERNAL FIXATION (ORIF) WRIST FRACTURE;  Surgeon: Sheral Apley, MD;  Location: [facility];  Service: Orthopedics;  Laterality: Left;       Home Medications    Prior to Admission medications   Medication Sig Start Date End Date Taking? Authorizing Provider  Ascorbic Acid (VITAMIN C PO) Take by mouth daily.    [provider]    BIOTIN PO Take by mouth daily.    [provider]  Cholecalciferol (VITAMIN D-3 PO) Take by mouth daily.    [provider]  cyclobenzaprine (FLEXERIL) 10 MG tablet Take 1 tablet (10 mg total) by mouth 3 (three) times daily as needed for muscle spasms. 05/31/20   Georgetta Haber, NP  HYDROcodone-acetaminophen (NORCO/VICODIN) 5-325 MG tablet Take 2 tablets by mouth every 4 (four) hours as needed. 03/25/20   Wieters, Hallie C, PA-C  ibuprofen (ADVIL) 800 MG tablet Take 1 tablet (800 mg total) by mouth 3 (three) times daily. 03/25/20   Wieters, Hallie C, PA-C  lisinopril (ZESTRIL) 20 MG tablet Take 1 tablet (20 mg total) by mouth daily. Patient taking differently: Take 20 mg by mouth at bedtime.  05/06/19   LampteyBritta Mccreedy, MD  meloxicam (MOBIC) 7.5 MG tablet Take 1 tablet (7.5 mg total) by mouth daily. 05/31/20   Georgetta Haber, NP    Family History Family History  Problem Relation Age of Onset  . Diabetes Mother   . Healthy Father     Social History Social History   Tobacco Use  . Smoking status: Former Smoker    Years: 10.00    Types: Cigarettes    Quit date: 2017    Years since quitting: 4.5  . Smokeless  tobacco: Never Used  Vaping Use  . Vaping Use: Some days  . Substances: CBD  . Devices: vapeppresso  Substance Use Topics  . Alcohol use: Yes    Comment: occasional  . Drug use: Yes    Types: Marijuana    Comment: 03-30-2020 per pt smokes average 2-3g per week for neuropathy     Allergies   Tomato, Pear, and Strawberry (diagnostic)   Review of Systems Review of Systems   Physical Exam Triage Vital Signs ED Triage Vitals [05/31/20 1156]  Enc Vitals Group     BP (!) 159/99     Pulse Rate 87     Resp 18     Temp 99 F (37.2 C)     Temp Source Oral     SpO2 100 %     Weight      Height      Head Circumference      Peak Flow      Pain Score      Pain Loc      Pain Edu?      Excl. in GC?    No data found.  Updated Vital Signs BP  (!) 146/85 (BP Location: Left Arm)   Pulse 83   Temp 99 F (37.2 C) (Oral)   Resp 16   SpO2 97%    Physical Exam Constitutional:      Appearance: He is well-developed.  Cardiovascular:     Rate and Rhythm: Normal rate.  Pulmonary:     Effort: Pulmonary effort is normal.  Musculoskeletal:     Left shoulder: Tenderness present. No deformity, effusion, laceration or bony tenderness. Decreased range of motion. Normal strength. Normal pulse.     Cervical back: Spasms and tenderness present. No bony tenderness. Pain with movement present. Normal range of motion.     Thoracic back: Tenderness present.     Lumbar back: Spasms and tenderness present. Positive left straight leg raise test.     Comments: Left side of neck, and back musculature with tenderness on palpation; no bony tenderness; pain with transition from sit to lay and lay to sit; pain with raising left arm above shoulder level, to left neck musculature; strength equal bilaterally; gross sensation intact to lower and upper extremities   Skin:    General: Skin is warm and dry.  Neurological:     Mental Status: He is alert and oriented to person, place, and time.      UC Treatments / Results  Labs (all labs ordered are listed, but only abnormal results are displayed) Labs Reviewed - No data to display  EKG   Radiology No results found.  Procedures Procedures (including critical care time)  Medications Ordered in UC Medications  ketorolac (TORADOL) injection 60 mg (has no administration in time range)    Initial Impression / Assessment and Plan / UC Course  I have reviewed the triage vital signs and the nursing notes.  Pertinent labs & imaging results that were available during my care of the patient were reviewed by me and considered in my medical decision making (see chart for details).     Exam is more consistent with muscular spasms to left neck and back. No red flag findings. History of recurrent issues  over the past 3 years. Encouraged establish and follow with orthopedics for long term management. Patient verbalized understanding and agreeable to plan.  Ambulatory out of clinic without difficulty.    Final Clinical Impressions(s) / UC Diagnoses  Final diagnoses:  Sciatica of left side  Neck muscle spasm     Discharge Instructions     Flexeril as needed. May cause drowsiness. Please do not take if driving or drinking alcohol.  Meloxicam daily. Don't take additional ibuprofen for aleve. Take with food.  Light and regular activity as tolerated.  See exercises provided.  Heat application while active can help with muscle spasms.  Sleep with pillow under your knees.   Please follow up orthopedics for further evaluation and management.    ED Prescriptions    Medication Sig Dispense Auth. Provider   cyclobenzaprine (FLEXERIL) 10 MG tablet Take 1 tablet (10 mg total) by mouth 3 (three) times daily as needed for muscle spasms. 20 tablet Linus Mako B, NP   meloxicam (MOBIC) 7.5 MG tablet Take 1 tablet (7.5 mg total) by mouth daily. 20 tablet Georgetta Haber, NP     PDMP not reviewed this encounter.   Georgetta Haber, NP 05/31/20 1246

## 2020-05-31 NOTE — ED Triage Notes (Signed)
Patient is here today with complaints of back spasms and pain that began on Monday. Patient states he also experiencing a  burning, tingling feeling on his left side that begins at times from his toes to his neck.

## 2020-07-12 DIAGNOSIS — G5622 Lesion of ulnar nerve, left upper limb: Secondary | ICD-10-CM | POA: Diagnosis not present

## 2020-07-12 DIAGNOSIS — M25532 Pain in left wrist: Secondary | ICD-10-CM | POA: Diagnosis not present

## 2020-07-28 DIAGNOSIS — G5622 Lesion of ulnar nerve, left upper limb: Secondary | ICD-10-CM | POA: Diagnosis not present

## 2020-08-04 DIAGNOSIS — G5622 Lesion of ulnar nerve, left upper limb: Secondary | ICD-10-CM | POA: Diagnosis not present

## 2020-08-16 DIAGNOSIS — M25532 Pain in left wrist: Secondary | ICD-10-CM | POA: Diagnosis not present

## 2020-08-23 DIAGNOSIS — M25532 Pain in left wrist: Secondary | ICD-10-CM | POA: Diagnosis not present

## 2020-09-14 ENCOUNTER — Other Ambulatory Visit: Payer: Self-pay

## 2020-09-14 ENCOUNTER — Ambulatory Visit (HOSPITAL_COMMUNITY)
Admission: EM | Admit: 2020-09-14 | Discharge: 2020-09-14 | Disposition: A | Discharge status: Home / Self Care | Payer: BC Managed Care – PPO | Attending: Family Medicine | Admitting: Family Medicine

## 2020-09-14 ENCOUNTER — Encounter (HOSPITAL_COMMUNITY): Payer: Self-pay

## 2020-09-14 DIAGNOSIS — M4722 Other spondylosis with radiculopathy, cervical region: Secondary | ICD-10-CM

## 2020-09-14 DIAGNOSIS — M47816 Spondylosis without myelopathy or radiculopathy, lumbar region: Secondary | ICD-10-CM | POA: Diagnosis not present

## 2020-09-14 MED ORDER — GABAPENTIN 300 MG PO CAPS
300.0000 mg | ORAL_CAPSULE | Freq: Three times a day (TID) | ORAL | 0 refills | Status: DC
Start: 2020-09-14 — End: 2021-03-08

## 2020-09-14 MED ORDER — CYCLOBENZAPRINE HCL 10 MG PO TABS
10.0000 mg | ORAL_TABLET | Freq: Three times a day (TID) | ORAL | 0 refills | Status: DC | PRN
Start: 2020-09-14 — End: 2021-03-08

## 2020-09-14 NOTE — Discharge Instructions (Addendum)
I have refilled the flexeril and the gabapentin  Return as needed

## 2020-09-14 NOTE — ED Triage Notes (Signed)
Pt presents with chronic neck and back pain that is unrelieved with ROM exercises and repositioning.

## 2020-09-14 NOTE — ED Provider Notes (Signed)
[facility]    CSN: [id number] Arrival date & time: 09/14/20  1324      History   Chief Complaint Chief Complaint  Patient presents with  . Neck Pain  . Back Pain    HPI Ernest Williams is a 49 y.o. male.   HPI  Patient is here for neck pain low back pain.  He has multiple visits for the same.  He has a history of degenerative disc disease of the cervical spine with radiculopathy into the left arm.  Also has chronic degenerative disc disease of the low back  with leg pain.  Chronic intermittent pain.  He is here requesting refills of cyclobenzaprine and gabapentin.  He states that anti-inflammatories do not help.  He states the steroids upset his stomach.  He is not requesting pain medication.  He sometimes gets a Toradol shot but states he does not feel like he needs this today.  He was sent home from work because he was uncomfortable.  Past Medical History:  Diagnosis Date  . Degeneration of spine   . Family history of adverse reaction to anesthesia    mother--- ponv  . History of concussion    09/ 2013  no loc,  no residual  . Hypertension    goes to urgent care , no regular pcp,  (stress echo 12-21-2009 in epic, normal)  . Peripheral neuropathy    per pt states told due to degenerative spine  . Wears glasses   . Wrist fracture, closed, left, initial encounter 03/25/2020    There are no problems to display for this patient.   Past Surgical History:  Procedure Laterality Date  . NO PAST SURGERIES    . ORIF WRIST FRACTURE Left 03/31/2020   Procedure: OPEN REDUCTION INTERNAL FIXATION (ORIF) WRIST FRACTURE;  Surgeon: Sheral Apley, MD;  Location: [facility];  Service: Orthopedics;  Laterality: Left;       Home Medications    Prior to Admission medications   Medication Sig Start Date End Date Taking? Authorizing Provider  Ascorbic Acid (VITAMIN C PO) Take by mouth daily.    [provider]  BIOTIN PO Take by mouth daily.     [provider]  Cholecalciferol (VITAMIN D-3 PO) Take by mouth daily.    [provider]  cyclobenzaprine (FLEXERIL) 10 MG tablet Take 1 tablet (10 mg total) by mouth 3 (three) times daily as needed for muscle spasms. 09/14/20   Eustace Moore, MD  gabapentin (NEURONTIN) 300 MG capsule Take 1 capsule (300 mg total) by mouth 3 (three) times daily. 09/14/20   Eustace Moore, MD  lisinopril (ZESTRIL) 20 MG tablet Take 1 tablet (20 mg total) by mouth daily. Patient taking differently: Take 20 mg by mouth at bedtime.  05/06/19   LampteyBritta Mccreedy, MD    Family History Family History  Problem Relation Age of Onset  . Diabetes Mother   . Healthy Father     Social History Social History   Tobacco Use  . Smoking status: Former Smoker    Years: 10.00    Types: Cigarettes    Quit date: 2017    Years since quitting: 4.8  . Smokeless tobacco: Never Used  Vaping Use  . Vaping Use: Some days  . Substances: CBD  . Devices: vapeppresso  Substance Use Topics  . Alcohol use: Yes    Comment: occasional  . Drug use: Yes    Types: Marijuana    Comment:  03-30-2020 per pt smokes average 2-3g per week for neuropathy     Allergies   Tomato, Pear, and Strawberry (diagnostic)   Review of Systems Review of Systems See HPI  Physical Exam Triage Vital Signs ED Triage Vitals  Enc Vitals Group     BP 09/14/20 1459 (!) 150/94     Pulse Rate 09/14/20 1459 87     Resp 09/14/20 1459 18     Temp 09/14/20 1459 98.3 F (36.8 C)     Temp Source 09/14/20 1459 Oral     SpO2 09/14/20 1459 97 %     Weight --      Height --      Head Circumference --      Peak Flow --      Pain Score 09/14/20 1458 10     Pain Loc --      Pain Edu? --      Excl. in GC? --    No data found.  Updated Vital Signs BP (!) 150/94 (BP Location: Right Arm) Comment: normally runs very high even with medication  Pulse 87   Temp 98.3 F (36.8 C) (Oral)   Resp 18   SpO2 97%     Physical  Exam Constitutional:      General: He is not in acute distress.    Appearance: Normal appearance. He is well-developed.     Comments: Appears in no distress  HENT:     Head: Normocephalic and atraumatic.  Eyes:     Conjunctiva/sclera: Conjunctivae normal.     Pupils: Pupils are equal, round, and reactive to light.  Cardiovascular:     Rate and Rhythm: Normal rate.  Pulmonary:     Effort: Pulmonary effort is normal. No respiratory distress.  Abdominal:     General: There is no distension.     Palpations: Abdomen is soft.  Musculoskeletal:        General: Normal range of motion.     Cervical back: Normal range of motion.  Skin:    General: Skin is warm and dry.  Neurological:     Mental Status: He is alert.     Motor: No weakness.     Coordination: Coordination normal.     Gait: Gait normal.     Deep Tendon Reflexes: Reflexes normal.  Psychiatric:        Behavior: Behavior normal.      UC Treatments / Results  Labs (all labs ordered are listed, but only abnormal results are displayed) Labs Reviewed - No data to display  EKG   Radiology No results found.  Procedures Procedures (including critical care time)  Medications Ordered in UC Medications - No data to display  Initial Impression / Assessment and Plan / UC Course  I have reviewed the triage vital signs and the nursing notes.  Pertinent labs & imaging results that were available during my care of the patient were reviewed by me and considered in my medical decision making (see chart for details).     Medicines are refilled.  Reviewed chart.  Follow-up with primary care. Final Clinical Impressions(s) / UC Diagnoses   Final diagnoses:  Spondylosis of lumbar region without myelopathy or radiculopathy  Osteoarthritis of spine with radiculopathy, cervical region     Discharge Instructions     I have refilled the flexeril and the gabapentin  Return as needed   ED Prescriptions    Medication Sig  Dispense Auth. Provider   cyclobenzaprine (FLEXERIL) 10 MG tablet  Take 1 tablet (10 mg total) by mouth 3 (three) times daily as needed for muscle spasms. 40 tablet Eustace Moore, MD   gabapentin (NEURONTIN) 300 MG capsule Take 1 capsule (300 mg total) by mouth 3 (three) times daily. 90 capsule Eustace Moore, MD     PDMP not reviewed this encounter.   Eustace Moore, MD 09/14/20 1525

## 2020-10-30 DIAGNOSIS — M25532 Pain in left wrist: Secondary | ICD-10-CM | POA: Diagnosis not present

## 2020-11-14 DIAGNOSIS — M25532 Pain in left wrist: Secondary | ICD-10-CM | POA: Diagnosis not present

## 2020-11-14 DIAGNOSIS — S52572S Other intraarticular fracture of lower end of left radius, sequela: Secondary | ICD-10-CM | POA: Diagnosis not present

## 2020-12-05 ENCOUNTER — Encounter (HOSPITAL_COMMUNITY): Payer: Self-pay

## 2020-12-05 ENCOUNTER — Ambulatory Visit (HOSPITAL_COMMUNITY)
Admission: EM | Admit: 2020-12-05 | Discharge: 2020-12-05 | Disposition: A | Discharge status: Home / Self Care | Payer: BC Managed Care – PPO | Attending: Internal Medicine | Admitting: Internal Medicine

## 2020-12-05 ENCOUNTER — Other Ambulatory Visit: Payer: Self-pay

## 2020-12-05 ENCOUNTER — Emergency Department (HOSPITAL_COMMUNITY)
Admission: EM | Admit: 2020-12-05 | Discharge: 2020-12-05 | Discharge status: Against Medical Advice | Payer: BC Managed Care – PPO

## 2020-12-05 DIAGNOSIS — J069 Acute upper respiratory infection, unspecified: Secondary | ICD-10-CM

## 2020-12-05 DIAGNOSIS — Z8249 Family history of ischemic heart disease and other diseases of the circulatory system: Secondary | ICD-10-CM | POA: Diagnosis not present

## 2020-12-05 DIAGNOSIS — R079 Chest pain, unspecified: Secondary | ICD-10-CM | POA: Diagnosis not present

## 2020-12-05 DIAGNOSIS — I10 Essential (primary) hypertension: Secondary | ICD-10-CM

## 2020-12-05 NOTE — ED Triage Notes (Signed)
Pt c/o acute mid sternal and left CP for past 3 days and SOB. Pt describes CP as squeezing, tightening/pressure in nature that increases with movement and exertion. Also reports nausea , pain to left jaw/shoulder area, sweatiness and mild dizziness. Left CP reproducible with palpation.  Pt states cough, congestion, diarrhea for past week. Has received three COVID vaccines. Took HTN Rx this morning.  Upon entering room, pt leaning over treatment table, bracing with arms 2/2 CP, slightly diminished BS to bilateral lung fields, elevated BP in both arms, tachypnea noted, skin slightly clammy in nature.   EKG performed and given to L. Cheree Ditto who advised she would be in to eval pt directly and likely send pt to ED for active CP.

## 2020-12-05 NOTE — ED Notes (Signed)
Patient is being discharged from the Urgent Care and sent to the Emergency Department via WC. Per L. Cheree Ditto, PApatient is in need of higher level of care due to CP, SOB Patient is aware and verbalizes understanding of plan of care.  Vitals:   12/05/20 1014 12/05/20 1017  BP: (!) 178/126 (!) 172/111  Pulse: 98   Resp: (!) 22   SpO2: 97%

## 2020-12-05 NOTE — Discharge Instructions (Addendum)
-  Head straight to [facility] ED for further evaluation of chest pain

## 2020-12-05 NOTE — ED Provider Notes (Signed)
[facility]    CSN: [id number] Arrival date & time: 12/05/20  2426      History   Chief Complaint Chief Complaint  Patient presents with  . Chest Pain  . Cough    HPI Ernest Williams is a 50 y.o. male presenting for 3 days of intermittent left-side chest pain radiating to left arm and jaw. States pain is worse with movement and coughing but he is also feeling this pain at rest intermittent. Describes as crushing pressure. Pt with history of hypertension, states he has taken his medications today though BP is significantly elevated. Family history of "multiple relatives that died of a heart attack before the age of 55." Also notes headaches and occ shortness of breath. He also notes nausea, LUQ pain, and diarrhea 3x daily for the last 3 days, though states he has loose bowels and abdominal pain at baseline. Of note, this patient has history of peripheral neuropathy and has some pain in L arm at baseline.   Has received three COVID vaccines. Took HTN Rx this morning.  Per triage note- upon entering room, pt leaning over treatment table, bracing with arms 2/2 CP, slightly diminished BS to bilateral lung fields, elevated BP in both arms, tachypnea noted, skin slightly clammy in nature.     HPI  Past Medical History:  Diagnosis Date  . Degeneration of spine   . Family history of adverse reaction to anesthesia    mother--- ponv  . History of concussion    09/ 2013  no loc,  no residual  . Hypertension    goes to urgent care , no regular pcp,  (stress echo 12-21-2009 in epic, normal)  . Peripheral neuropathy    per pt states told due to degenerative spine  . Wears glasses   . Wrist fracture, closed, left, initial encounter 03/25/2020    There are no problems to display for this patient.   Past Surgical History:  Procedure Laterality Date  . NO PAST SURGERIES    . ORIF WRIST FRACTURE Left 03/31/2020   Procedure: OPEN REDUCTION INTERNAL FIXATION (ORIF) WRIST  FRACTURE;  Surgeon: Sheral Apley, MD;  Location: [facility];  Service: Orthopedics;  Laterality: Left;       Home Medications    Prior to Admission medications   Medication Sig Start Date End Date Taking? Authorizing Provider  gabapentin (NEURONTIN) 300 MG capsule Take 1 capsule (300 mg total) by mouth 3 (three) times daily. 09/14/20  Yes Eustace Moore, MD  lisinopril (ZESTRIL) 20 MG tablet Take 1 tablet (20 mg total) by mouth daily. Patient taking differently: Take 20 mg by mouth at bedtime. 05/06/19  Yes Lamptey, Britta Mccreedy, MD  Ascorbic Acid (VITAMIN C PO) Take by mouth daily.    [provider]  BIOTIN PO Take by mouth daily.    [provider]  Cholecalciferol (VITAMIN D-3 PO) Take by mouth daily.    [provider]  cyclobenzaprine (FLEXERIL) 10 MG tablet Take 1 tablet (10 mg total) by mouth 3 (three) times daily as needed for muscle spasms. 09/14/20   Eustace Moore, MD    Family History Family History  Problem Relation Age of Onset  . Diabetes Mother   . Healthy Father     Social History Social History   Tobacco Use  . Smoking status: Former Smoker    Years: 10.00    Types: Cigarettes    Quit date: 2017    Years since quitting:  5.0  . Smokeless tobacco: Never Used  Vaping Use  . Vaping Use: Some days  . Substances: CBD  . Devices: vapeppresso  Substance Use Topics  . Alcohol use: Yes    Comment: occasional  . Drug use: Yes    Types: Marijuana    Comment: 03-30-2020 per pt smokes average 2-3g per week for neuropathy     Allergies   Tomato, Pear, and Strawberry (diagnostic)   Review of Systems Review of Systems  Constitutional: Negative for appetite change, chills and fever.  HENT: Negative for congestion, ear pain, rhinorrhea, sinus pressure, sinus pain and sore throat.   Eyes: Negative for redness and visual disturbance.  Respiratory: Positive for shortness of breath. Negative for cough, chest  tightness and wheezing.   Cardiovascular: Positive for chest pain. Negative for palpitations.  Gastrointestinal: Positive for abdominal pain and diarrhea. Negative for constipation, nausea and vomiting.  Genitourinary: Negative for dysuria, frequency and urgency.  Musculoskeletal: Negative for myalgias.  Neurological: Positive for headaches. Negative for dizziness and weakness.  Psychiatric/Behavioral: Negative for confusion.  All other systems reviewed and are negative.    Physical Exam Triage Vital Signs ED Triage Vitals  Enc Vitals Group     BP 12/05/20 1014 (!) 178/126     Pulse Rate 12/05/20 1014 98     Resp 12/05/20 1014 (!) 22     Temp --      Temp src --      SpO2 12/05/20 1014 97 %     Weight --      Height --      Head Circumference --      Peak Flow --      Pain Score 12/05/20 1017 4     Pain Loc --      Pain Edu? --      Excl. in GC? --    No data found.  Updated Vital Signs BP (!) 172/111 (BP Location: Right Arm)   Pulse 98   Resp (!) 22   SpO2 97%   Visual Acuity Right Eye Distance:   Left Eye Distance:   Bilateral Distance:    Right Eye Near:   Left Eye Near:    Bilateral Near:     Physical Exam Vitals reviewed.  Constitutional:      General: He is not in acute distress.    Appearance: Normal appearance. He is not ill-appearing.     Comments: Uncomfortable appearing  HENT:     Head: Normocephalic and atraumatic.     Right Ear: Hearing, tympanic membrane, ear canal and external ear normal. No swelling or tenderness. There is no impacted cerumen. No mastoid tenderness. Tympanic membrane is not perforated, erythematous, retracted or bulging.     Left Ear: Hearing, tympanic membrane, ear canal and external ear normal. No swelling or tenderness. There is no impacted cerumen. No mastoid tenderness. Tympanic membrane is not perforated, erythematous, retracted or bulging.     Nose:     Right Sinus: No maxillary sinus tenderness or frontal sinus  tenderness.     Left Sinus: No maxillary sinus tenderness or frontal sinus tenderness.     Mouth/Throat:     Mouth: Mucous membranes are moist.     Pharynx: Uvula midline. No oropharyngeal exudate or posterior oropharyngeal erythema.     Tonsils: No tonsillar exudate.  Cardiovascular:     Rate and Rhythm: Normal rate and regular rhythm.     Heart sounds: Normal heart sounds.  Pulmonary:  Effort: Pulmonary effort is normal.     Breath sounds: Normal air entry. Decreased breath sounds present. No wheezing, rhonchi or rales.  Chest:     Chest wall: No tenderness.     Comments: Reproducible chest pain along L sternal border Abdominal:     General: Abdomen is flat. Bowel sounds are normal.     Tenderness: There is abdominal tenderness in the right upper quadrant and left upper quadrant. There is no right CVA tenderness, left CVA tenderness, guarding or rebound. Negative signs include Murphy's sign, Rovsing's sign and McBurney's sign.  Musculoskeletal:     Cervical back: Normal range of motion and neck supple. No rigidity.  Lymphadenopathy:     Cervical: No cervical adenopathy.  Neurological:     General: No focal deficit present.     Mental Status: He is alert and oriented to person, place, and time. Mental status is at baseline.     Cranial Nerves: Cranial nerves are intact. No cranial nerve deficit or facial asymmetry.     Sensory: Sensation is intact. No sensory deficit.     Motor: Motor function is intact. No weakness.     Coordination: Coordination is intact. Coordination normal.     Gait: Gait is intact. Gait normal.     Comments: CN 2-12 intact. No weakness or numbness in UEs or LEs.  Psychiatric:        Attention and Perception: Attention and perception normal.        Mood and Affect: Mood and affect normal.        Behavior: Behavior normal. Behavior is cooperative.        Thought Content: Thought content normal.        Judgment: Judgment normal.      UC Treatments /  Results  Labs (all labs ordered are listed, but only abnormal results are displayed) Labs Reviewed - No data to display  EKG   Radiology No results found.  Procedures Procedures (including critical care time)  Medications Ordered in UC Medications - No data to display  Initial Impression / Assessment and Plan / UC Course  I have reviewed the triage vital signs and the nursing notes.  Pertinent labs & imaging results that were available during my care of the patient were reviewed by me and considered in my medical decision making (see chart for details).     Pt presenting today with crushing L sided chest pain intermittently at rest, worse with movement and palpation. Also with LUQ and RUQ pain, diarrhea. BP 178/126; on recheck 172/111. He endorses headaches and intermittent shortness of breath. Significant family history of cardiac arrest/death before the age of 45 in multiple relatives (per pt). EKG NSR, unchanged from 2020 EKG. Sending this patient to [facility] ED for further evaluation. He will be wheeled down by nurse.   Final Clinical Impressions(s) / UC Diagnoses   Final diagnoses:  Left-sided chest pain  Acute upper respiratory infection  Family history of myocardial infarction  Essential hypertension     Discharge Instructions     -Head straight to [facility] ED for further evaluation of chest pain   ED Prescriptions    None     PDMP not reviewed this encounter.   Rhys Martini, PA-C 12/05/20 1055

## 2020-12-17 ENCOUNTER — Emergency Department (HOSPITAL_COMMUNITY): Payer: BC Managed Care – PPO

## 2020-12-17 ENCOUNTER — Encounter (HOSPITAL_COMMUNITY): Payer: Self-pay | Admitting: Emergency Medicine

## 2020-12-17 ENCOUNTER — Other Ambulatory Visit: Payer: Self-pay

## 2020-12-17 ENCOUNTER — Observation Stay (HOSPITAL_COMMUNITY)
Admission: EM | Admit: 2020-12-17 | Discharge: 2020-12-18 | Disposition: A | Discharge status: Home / Self Care | Payer: BC Managed Care – PPO | Attending: Cardiology | Admitting: Cardiology

## 2020-12-17 DIAGNOSIS — I10 Essential (primary) hypertension: Secondary | ICD-10-CM | POA: Insufficient documentation

## 2020-12-17 DIAGNOSIS — I2510 Atherosclerotic heart disease of native coronary artery without angina pectoris: Secondary | ICD-10-CM | POA: Insufficient documentation

## 2020-12-17 DIAGNOSIS — R079 Chest pain, unspecified: Secondary | ICD-10-CM | POA: Diagnosis present

## 2020-12-17 DIAGNOSIS — E669 Obesity, unspecified: Secondary | ICD-10-CM | POA: Insufficient documentation

## 2020-12-17 DIAGNOSIS — Z79899 Other long term (current) drug therapy: Secondary | ICD-10-CM | POA: Insufficient documentation

## 2020-12-17 DIAGNOSIS — Z8249 Family history of ischemic heart disease and other diseases of the circulatory system: Secondary | ICD-10-CM | POA: Diagnosis not present

## 2020-12-17 DIAGNOSIS — Z20822 Contact with and (suspected) exposure to covid-19: Secondary | ICD-10-CM | POA: Insufficient documentation

## 2020-12-17 DIAGNOSIS — E1159 Type 2 diabetes mellitus with other circulatory complications: Secondary | ICD-10-CM

## 2020-12-17 DIAGNOSIS — I209 Angina pectoris, unspecified: Secondary | ICD-10-CM | POA: Diagnosis not present

## 2020-12-17 DIAGNOSIS — I77819 Aortic ectasia, unspecified site: Secondary | ICD-10-CM | POA: Insufficient documentation

## 2020-12-17 DIAGNOSIS — E785 Hyperlipidemia, unspecified: Secondary | ICD-10-CM | POA: Insufficient documentation

## 2020-12-17 DIAGNOSIS — Z87891 Personal history of nicotine dependence: Secondary | ICD-10-CM | POA: Insufficient documentation

## 2020-12-17 DIAGNOSIS — R0789 Other chest pain: Secondary | ICD-10-CM | POA: Diagnosis not present

## 2020-12-17 DIAGNOSIS — G629 Polyneuropathy, unspecified: Secondary | ICD-10-CM | POA: Diagnosis not present

## 2020-12-17 DIAGNOSIS — I208 Other forms of angina pectoris: Secondary | ICD-10-CM

## 2020-12-17 LAB — CBC
HCT: 43.8 % (ref 39.0–52.0)
Hemoglobin: 14.1 g/dL (ref 13.0–17.0)
MCH: 30.7 pg (ref 26.0–34.0)
MCHC: 32.2 g/dL (ref 30.0–36.0)
MCV: 95.2 fL (ref 80.0–100.0)
Platelets: 238 10*3/uL (ref 150–400)
RBC: 4.6 MIL/uL (ref 4.22–5.81)
RDW: 13.7 % (ref 11.5–15.5)
WBC: 7.4 10*3/uL (ref 4.0–10.5)
nRBC: 0 % (ref 0.0–0.2)

## 2020-12-17 LAB — BASIC METABOLIC PANEL
Anion gap: 9 (ref 5–15)
BUN: 14 mg/dL (ref 6–20)
CO2: 27 mmol/L (ref 22–32)
Calcium: 9 mg/dL (ref 8.9–10.3)
Chloride: 105 mmol/L (ref 98–111)
Creatinine, Ser: 1.04 mg/dL (ref 0.61–1.24)
GFR, Estimated: >60 mL/min (ref >60)
Glucose, Bld: 119 mg/dL — ABNORMAL HIGH (ref 70–99)
Potassium: 4.4 mmol/L (ref 3.5–5.1)
Sodium: 141 mmol/L (ref 135–145)

## 2020-12-17 LAB — HEMOGLOBIN A1C
Hgb A1c MFr Bld: 6.1 % — ABNORMAL HIGH (ref 4.8–5.6)
Mean Plasma Glucose: 128.37 mg/dL

## 2020-12-17 LAB — TROPONIN I (HIGH SENSITIVITY)
Troponin I (High Sensitivity): 7 ng/L (ref <18)
Troponin I (High Sensitivity): 6 ng/L (ref <18)

## 2020-12-17 MED ORDER — ASPIRIN EC 81 MG PO TBEC
81.0000 mg | DELAYED_RELEASE_TABLET | Freq: Every day | ORAL | Status: DC
  Administered 2020-12-18: 81 mg via ORAL
  Filled 2020-12-17: qty 1

## 2020-12-17 MED ORDER — ONDANSETRON HCL 4 MG/2ML IJ SOLN: 4.0000 mg | Freq: Four times a day (QID) | INTRAMUSCULAR | Status: DC | PRN

## 2020-12-17 MED ORDER — CYCLOBENZAPRINE HCL 10 MG PO TABS: 10.0000 mg | ORAL_TABLET | Freq: Three times a day (TID) | ORAL | Status: DC | PRN

## 2020-12-17 MED ORDER — IOHEXOL 350 MG/ML SOLN
90.0000 mL | Freq: Once | INTRAVENOUS | Status: AC | PRN
  Administered 2020-12-17: 90 mL via INTRAVENOUS

## 2020-12-17 MED ORDER — ACETAMINOPHEN 325 MG PO TABS: 650.0000 mg | ORAL_TABLET | ORAL | Status: DC | PRN

## 2020-12-17 MED ORDER — METOPROLOL TARTRATE 25 MG PO TABS
100.0000 mg | ORAL_TABLET | Freq: Once | ORAL | Status: AC
  Administered 2020-12-17: 100 mg via ORAL
  Filled 2020-12-17: qty 4

## 2020-12-17 MED ORDER — NITROGLYCERIN 0.4 MG SL SUBL: 0.4000 mg | SUBLINGUAL_TABLET | SUBLINGUAL | Status: DC | PRN

## 2020-12-17 MED ORDER — LISINOPRIL 20 MG PO TABS
20.0000 mg | ORAL_TABLET | Freq: Every day | ORAL | Status: DC
  Administered 2020-12-17: 20 mg via ORAL
  Filled 2020-12-17: qty 1

## 2020-12-17 MED ORDER — NITROGLYCERIN 0.4 MG SL SUBL
SUBLINGUAL_TABLET | SUBLINGUAL | Status: AC
  Filled 2020-12-17: qty 2

## 2020-12-17 MED ORDER — ASPIRIN 81 MG PO CHEW
324.0000 mg | CHEWABLE_TABLET | Freq: Once | ORAL | Status: AC
  Administered 2020-12-17: 324 mg via ORAL
  Filled 2020-12-17: qty 4

## 2020-12-17 MED ORDER — GABAPENTIN 300 MG PO CAPS: 300.0000 mg | ORAL_CAPSULE | Freq: Three times a day (TID) | ORAL | Status: DC | PRN

## 2020-12-17 MED ORDER — ENOXAPARIN SODIUM 40 MG/0.4ML ~~LOC~~ SOLN: 40.0000 mg | SUBCUTANEOUS | Status: DC

## 2020-12-17 NOTE — ED Notes (Signed)
Called lab to add on A1C.  

## 2020-12-17 NOTE — H&P (Addendum)
Cardiology Admission History and Physical:   Patient ID: Ernest Williams MRN: [id number]; DOB: 1971/09/03   Admission date: 12/17/2020  Primary Care Provider: Patient, No Pcp Per [facility] Cardiologist: New patient  Chief Complaint: Chest pain  History of Present Illness:   Ernest Williams is a 50 y.o. male with prior medical history of hypertension, peripheral neuropathy of unknown etiology and very significant family history of premature coronary artery disease specifically sudden cardiac death with MI in his sister at age 74, his sister also had Prader-Willi syndrome, his brother died of myocardial infarction at age 7 and his mother died at age 54 of myocardial infarction.  The patient himself has never had his lipids checked.  He is minimally active, he blames it on his peripheral neuropathy, he has sedentary job, he also has obesity and does not have a primary care physician, he received blood pressure medications in urgent care but ran out 2 weeks ago.  In the last couple weeks he has noticed mostly exertional shortness of breath with pressure-like chest pain radiating to his arms.  This was resolved at rest.  In the last few days he would notice the symptoms even at rest.  No presyncope or syncope.   Past Medical History:  Diagnosis Date  . Degeneration of spine   . Family history of adverse reaction to anesthesia    mother--- ponv  . History of concussion    09/ 2013  no loc,  no residual  . Hypertension    goes to urgent care , no regular pcp,  (stress echo 12-21-2009 in epic, normal)  . Peripheral neuropathy    per pt states told due to degenerative spine  . Wears glasses   . Wrist fracture, closed, left, initial encounter 03/25/2020    Past Surgical History:  Procedure Laterality Date  . NO PAST SURGERIES    . ORIF WRIST FRACTURE Left 03/31/2020   Procedure: OPEN REDUCTION INTERNAL FIXATION (ORIF) WRIST FRACTURE;  Surgeon: Sheral Apley, MD;  Location: [facility];  Service: Orthopedics;  Laterality: Left;     Medications Prior to Admission: Prior to Admission medications   Medication Sig Start Date End Date Taking? Authorizing Provider  Ascorbic Acid (VITAMIN C PO) Take by mouth daily.   Yes [provider]  Cholecalciferol (VITAMIN D3 PO) Take 1 tablet by mouth daily.   Yes [provider]  Cyanocobalamin (VITAMIN B-12 PO) Take 1 tablet by mouth daily.   Yes [provider]  cyclobenzaprine (FLEXERIL) 10 MG tablet Take 1 tablet (10 mg total) by mouth 3 (three) times daily as needed for muscle spasms. 09/14/20  Yes Eustace Moore, MD  diphenhydrAMINE (BENADRYL) 25 MG tablet Take 25 mg by mouth daily as needed.   Yes [provider]  gabapentin (NEURONTIN) 300 MG capsule Take 1 capsule (300 mg total) by mouth 3 (three) times daily. Patient taking differently: Take 300 mg by mouth 3 (three) times daily as needed. 09/14/20  Yes Eustace Moore, MD  L-ARGININE PO Take 1 tablet by mouth daily.   Yes [provider]  lisinopril (ZESTRIL) 20 MG tablet Take 1 tablet (20 mg total) by mouth daily. Patient taking differently: Take 20 mg by mouth at bedtime. 05/06/19  Yes Lamptey, Britta Mccreedy, MD     Allergies:    Allergies  Allergen Reactions  . Tomato Anaphylaxis  . Pear     Upset stomach  . Strawberry (Diagnostic) Swelling    Swelling in  mouth Upset stomach    Social History:   Social History   Socioeconomic History  . Marital status: Single    Spouse name: Not on file  . Number of children: Not on file  . Years of education: Not on file  . Highest education level: Not on file  Occupational History  . Not on file  Tobacco Use  . Smoking status: Former Smoker    Years: 10.00    Types: Cigarettes    Quit date: 2017    Years since quitting: 5.1  . Smokeless tobacco: Never Used  Vaping Use  . Vaping Use: Some days  . Substances: CBD  . Devices: vapeppresso  Substance and Sexual  Activity  . Alcohol use: Yes    Comment: occasional  . Drug use: Yes    Types: Marijuana    Comment: 03-30-2020 per pt smokes average 2-3g per week for neuropathy  . Sexual activity: Not on file  Other Topics Concern  . Not on file  Social History Narrative  . Not on file   Social Determinants of Health   Financial Resource Strain: Not on file  Food Insecurity: Not on file  Transportation Needs: Not on file  Physical Activity: Not on file  Stress: Not on file  Social Connections: Not on file  Intimate Partner Violence: Not on file    Family History:   The patient's family history includes Diabetes in his mother; Healthy in his father.    ROS:  Please see the history of present illness.  All other ROS reviewed and negative.     Physical Exam/Data:   Vitals:   12/17/20 1430 12/17/20 1453 12/17/20 1500 12/17/20 1545  BP: (!) 147/89 (!) 152/95 (!) 148/89 (!) 156/102  Pulse: 74 74 77 73  Resp: (!) 21 20 19 19   Temp:      TempSrc:      SpO2: 97% 98% 98% 96%   No intake or output data in the 24 hours ending 12/17/20 1656 Last 3 Weights 03/30/2020 01/20/2020 01/22/2018  Weight (lbs) 239 lb 240 lb 260 lb  Weight (kg) 108.41 kg 108.863 kg 117.935 kg     There is no height or weight on file to calculate BMI.  General:  Well nourished, well developed, in no acute distress HEENT: normal Lymph: no adenopathy Neck: no JVD Endocrine:  No thryomegaly Vascular: No carotid bruits; FA pulses 2+ bilaterally without bruits  Cardiac:  normal S1, S2; RRR; no murmur  Lungs:  clear to auscultation bilaterally, no wheezing, rhonchi or rales  Abd: soft, nontender, no hepatomegaly  Ext: no edema Musculoskeletal:  No deformities, BUE and BLE strength normal and equal Skin: warm and dry  Neuro:  CNs 2-12 intact, no focal abnormalities noted Psych:  Normal affect    EKG:  The ECG that was done was personally reviewed and demonstrates sinus rhythm, 74 bpm, no acute ST-T wave  abnormalities.  Relevant CV Studies: Stress echocardiogram 12/21/2009  Baseline:   - LV size was normal.  - LV global systolic function was normal.  - Normal wall motion; no LV regional wall motion abnormalities.  Peak stress:   - LV size was appropriately decreased from baseline.  - LV global systolic function was vigorous and appropriately   augmented from baseline.  - Normal wall motion; no LV regional wall motion abnormalities.   --------------------------------------------------------------------  Stress echo results: Left ventricular ejection fraction was normal  at rest and with stress. Normal echo stress  Laboratory Data:  High Sensitivity Troponin:   Recent Labs  Lab 12/17/20 0756 12/17/20 1415  TROPONINIHS 7 6      Chemistry Recent Labs  Lab 12/17/20 0756  NA 141  K 4.4  CL 105  CO2 27  GLUCOSE 119*  BUN 14  CREATININE 1.04  CALCIUM 9.0  GFRNONAA >60  ANIONGAP 9    No results for input(s): PROT, ALBUMIN, AST, ALT, ALKPHOS, BILITOT in the last 168 hours. Hematology Recent Labs  Lab 12/17/20 0756  WBC 7.4  RBC 4.60  HGB 14.1  HCT 43.8  MCV 95.2  MCH 30.7  MCHC 32.2  RDW 13.7  PLT 238   BNPNo results for input(s): BNP, PROBNP in the last 168 hours.  DDimer No results for input(s): DDIMER in the last 168 hours.   Radiology/Studies:  DG Chest 2 View  Result Date: 12/17/2020 CLINICAL DATA:  50 year old male with left side chest pain radiating to the arm and neck. Shortness of breath. Former smoker. EXAM: CHEST - 2 VIEW COMPARISON:  Chest radiographs 09/28/2019 and earlier. FINDINGS: PA and lateral views. Lung volumes and mediastinal contours remain normal. Visualized tracheal air column is within normal limits. Subtle chronic increased interstitial markings about the right hilum appear stable since 2019. No pneumothorax, pulmonary edema, pleural effusion or acute pulmonary opacity. No acute osseous abnormality identified. Negative  visible bowel gas pattern. IMPRESSION: No acute cardiopulmonary abnormality. Electronically Signed   By: Odessa Fleming M.D.   On: 12/17/2020 07:43    Assessment and Plan:   1. Typical exertional chest pain with multiple risk factors for CAD including significant family history of premature coronary artery disease, physical inactivity, obesity, untreated hypertension, possible hyperlipidemia, prior smoking 2. The patient had normal troponins and normal EKG 3. We will obtain coronary CTA in the ED and either discharge or admit based on results.  Symptoms might also be related to hypertensive urgency in the settings of untreated high blood pressure. 4. We will obtain his lipid profile and hemoglobin A1c.   Risk Assessment/Risk Scores:    Severity of Illness: The appropriate patient status for this patient is OBSERVATION. Observation status is judged to be reasonable and necessary in order to provide the required intensity of service to ensure the patient's safety. The patient's presenting symptoms, physical exam findings, and initial radiographic and laboratory data in the context of their medical condition is felt to place them at decreased risk for further clinical deterioration. Furthermore, it is anticipated that the patient will be medically stable for discharge from the hospital within 2 midnights of admission. The following factors support the patient status of observation.   " The patient's presenting symptoms include chest pain, shortness of breath. " The physical exam findings include unremarkable physical exam other than obesity. " The initial radiographic and laboratory data are normal troponin.     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  His presentation is most probably secondary to hypertensive urgency, I would obtain an echocardiogram, restart home lisinopril 20 mg daily and uptitrate as needed.  Anticipated discharge in the morning.  Tobias Alexander,  MD 12/17/2020

## 2020-12-17 NOTE — ED Provider Notes (Signed)
[facility] EMERGENCY DEPARTMENT Provider Note   CSN: [id number] Arrival date & time: 12/17/20  5366     History Chief Complaint  Patient presents with  . Chest Pain    Deontrey Massi is a 50 y.o. male.  The history is provided by the patient.  Chest Pain Pain location:  L chest Pain quality: aching and tightness   Pain radiates to:  Neck and L jaw (shoulder blades) Pain severity:  Moderate Onset quality:  Gradual Duration:  3 weeks Timing:  Intermittent Progression:  Worsening Chronicity:  New Context comment:  Usually occurs while he is doing something Relieved by:  Rest Exacerbated by: he is not sure if anything makes it worse. Ineffective treatments:  None tried Associated symptoms: dizziness, fatigue and shortness of breath   Associated symptoms: no abdominal pain, no altered mental status, no anorexia, no cough, no fever, no headache, no heartburn, no lower extremity edema, no nausea and no near-syncope   Risk factors comment:  Hx of HTN, multiple family members with death from MI, mom in her 28's, younger brother and sister all died of MI.  marijuana use but no cocaine or tobacco      Past Medical History:  Diagnosis Date  . Degeneration of spine   . Family history of adverse reaction to anesthesia    mother--- ponv  . History of concussion    09/ 2013  no loc,  no residual  . Hypertension    goes to urgent care , no regular pcp,  (stress echo 12-21-2009 in epic, normal)  . Peripheral neuropathy    per pt states told due to degenerative spine  . Wears glasses   . Wrist fracture, closed, left, initial encounter 03/25/2020    There are no problems to display for this patient.   Past Surgical History:  Procedure Laterality Date  . NO PAST SURGERIES    . ORIF WRIST FRACTURE Left 03/31/2020   Procedure: OPEN REDUCTION INTERNAL FIXATION (ORIF) WRIST FRACTURE;  Surgeon: Sheral Apley, MD;  Location: [facility];  Service:  Orthopedics;  Laterality: Left;       Family History  Problem Relation Age of Onset  . Diabetes Mother   . Healthy Father     Social History   Tobacco Use  . Smoking status: Former Smoker    Years: 10.00    Types: Cigarettes    Quit date: 2017    Years since quitting: 5.1  . Smokeless tobacco: Never Used  Vaping Use  . Vaping Use: Some days  . Substances: CBD  . Devices: vapeppresso  Substance Use Topics  . Alcohol use: Yes    Comment: occasional  . Drug use: Yes    Types: Marijuana    Comment: 03-30-2020 per pt smokes average 2-3g per week for neuropathy    Home Medications Prior to Admission medications   Medication Sig Start Date End Date Taking? Authorizing Provider  Ascorbic Acid (VITAMIN C PO) Take by mouth daily.   Yes [provider]  Cholecalciferol (VITAMIN D3 PO) Take 1 tablet by mouth daily.   Yes [provider]  Cyanocobalamin (VITAMIN B-12 PO) Take 1 tablet by mouth daily.   Yes [provider]  cyclobenzaprine (FLEXERIL) 10 MG tablet Take 1 tablet (10 mg total) by mouth 3 (three) times daily as needed for muscle spasms. 09/14/20  Yes Eustace Moore, MD  diphenhydrAMINE (BENADRYL) 25 MG tablet Take 25 mg by mouth daily as needed.  Yes [provider]  gabapentin (NEURONTIN) 300 MG capsule Take 1 capsule (300 mg total) by mouth 3 (three) times daily. Patient taking differently: Take 300 mg by mouth 3 (three) times daily as needed. 09/14/20  Yes Eustace Moore, MD  L-ARGININE PO Take 1 tablet by mouth daily.   Yes [provider]  lisinopril (ZESTRIL) 20 MG tablet Take 1 tablet (20 mg total) by mouth daily. Patient taking differently: Take 20 mg by mouth at bedtime. 05/06/19  Yes Lamptey, Britta Mccreedy, MD    Allergies    Tomato, Pear, and Strawberry (diagnostic)  Review of Systems   Review of Systems  Constitutional: Positive for fatigue. Negative for fever.  Respiratory: Positive for shortness of breath.  Negative for cough.   Cardiovascular: Positive for chest pain. Negative for near-syncope.  Gastrointestinal: Negative for abdominal pain, anorexia, heartburn and nausea.  Neurological: Positive for dizziness. Negative for headaches.  All other systems reviewed and are negative.   Physical Exam Updated Vital Signs BP (!) 146/84   Pulse 73   Temp 98.1 F (36.7 C) (Oral)   Resp 15   SpO2 96%   Physical Exam Vitals and nursing note reviewed.  Constitutional:      General: He is not in acute distress.    Appearance: He is well-developed and well-nourished.  HENT:     Head: Normocephalic and atraumatic.     Mouth/Throat:     Mouth: Oropharynx is clear and moist. Mucous membranes are moist.  Eyes:     Extraocular Movements: EOM normal.     Conjunctiva/sclera: Conjunctivae normal.     Pupils: Pupils are equal, round, and reactive to light.  Cardiovascular:     Rate and Rhythm: Normal rate and regular rhythm.     Pulses: Intact distal pulses.     Heart sounds: No murmur heard.   Pulmonary:     Effort: Pulmonary effort is normal. No respiratory distress.     Breath sounds: Normal breath sounds. No wheezing or rales.     Comments: No reproducible left chest pain.   Abdominal:     General: There is no distension.     Palpations: Abdomen is soft.     Tenderness: There is no abdominal tenderness. There is no guarding or rebound.  Musculoskeletal:        General: No tenderness or edema. Normal range of motion.     Cervical back: Normal range of motion and neck supple.     Right lower leg: No edema.     Left lower leg: No edema.  Skin:    General: Skin is warm and dry.     Findings: No erythema or rash.     Comments: No skin rashes  Neurological:     General: No focal deficit present.     Mental Status: He is alert and oriented to person, place, and time. Mental status is at baseline.  Psychiatric:        Mood and Affect: Mood and affect and mood normal.        Behavior:  Behavior normal.        Thought Content: Thought content normal.     ED Results / Procedures / Treatments   Labs (all labs ordered are listed, but only abnormal results are displayed) Labs Reviewed  BASIC METABOLIC PANEL - Abnormal; Notable for the following components:      Result Value   Glucose, Bld 119 (*)    All other components within normal  limits  CBC  TROPONIN I (HIGH SENSITIVITY)  TROPONIN I (HIGH SENSITIVITY)    EKG EKG Interpretation  Date/Time:  Sunday December 17 2020 07:13:44 EST Ventricular Rate:  86 PR Interval:  130 QRS Duration: 94 QT Interval:  360 QTC Calculation: 430 R Axis:   24 Text Interpretation: Normal sinus rhythm Normal ECG No significant change since last tracing Confirmed by Gwyneth Sprout ([id number]) on 12/17/2020 12:34:42 PM   Radiology DG Chest 2 View  Result Date: 12/17/2020 CLINICAL DATA:  49 year old male with left side chest pain radiating to the arm and neck. Shortness of breath. Former smoker. EXAM: CHEST - 2 VIEW COMPARISON:  Chest radiographs 09/28/2019 and earlier. FINDINGS: PA and lateral views. Lung volumes and mediastinal contours remain normal. Visualized tracheal air column is within normal limits. Subtle chronic increased interstitial markings about the right hilum appear stable since 2019. No pneumothorax, pulmonary edema, pleural effusion or acute pulmonary opacity. No acute osseous abnormality identified. Negative visible bowel gas pattern. IMPRESSION: No acute cardiopulmonary abnormality. Electronically Signed   By: Odessa Fleming M.D.   On: 12/17/2020 07:43    Procedures Procedures   Medications Ordered in ED Medications  aspirin chewable tablet 324 mg (324 mg Oral Given 12/17/20 1259)    ED Course  I have reviewed the triage vital signs and the nursing notes.  Pertinent labs & imaging results that were available during my care of the patient were reviewed by me and considered in my medical decision making (see chart for  details).    MDM Rules/Calculators/A&P                          Patient is a 50 year old male presenting today with complaints of exertional chest pain.  He reports his symptoms have been present for approximately 3 weeks now which have been gradually increasing.  Now he is having 2 episodes a day of the pain it is usually when he is up doing something.  He denies any respiratory symptoms such as cough, fever.  He has had no lower extremity edema or weight gain.  Patient has multiple family members who have died from MI in their 44s or younger including his mom, younger sister and younger brother.  He has never been evaluated by a cardiologist and has never had a heart catheterization.  His heart score is 5 and today pain-free his EKG is normal and troponin is 7.  He reports the episode usually last about a minute or 2 and improves when he sits down and rests.  He also notes that in the last week he has run out of his lisinopril.  He has been receiving the lisinopril from urgent care because he has had difficulty getting a new doctor.  Low suspicion at this time for dissection, PE, pneumonia or Covid.  Concern for stable angina given patient's symptoms, risk factors and family history.  Will discuss with cardiology after he gets his second troponin.  Patient was given an aspirin.  Currently he is pain-free.  3:25 PM Delta trop is neg.  Spoke with Dr. Delton See with cardiology and she wanted to try and get a coronary CT for further evaluation.  She will order and evaluate the pt.  Final Clinical Impression(s) / ED Diagnoses Final diagnoses:  Angina of effort [facility])    Rx / DC Orders ED Discharge Orders    None       Gwyneth Sprout, MD 12/17/20 [phone]

## 2020-12-17 NOTE — ED Triage Notes (Signed)
C/o intermittent L sided chest pain with SOB and dizziness x 1 week.  Also reports pain to L shoulder blade, LUQ, and neck stiffness.

## 2020-12-18 ENCOUNTER — Observation Stay (HOSPITAL_BASED_OUTPATIENT_CLINIC_OR_DEPARTMENT_OTHER): Payer: BC Managed Care – PPO

## 2020-12-18 ENCOUNTER — Observation Stay (HOSPITAL_COMMUNITY)
Admission: RE | Admit: 2020-12-18 | Discharge: 2020-12-18 | Disposition: A | Discharge status: Home / Self Care | Payer: BC Managed Care – PPO | Source: Ambulatory Visit | Attending: Cardiology | Admitting: Cardiology

## 2020-12-18 ENCOUNTER — Other Ambulatory Visit (HOSPITAL_COMMUNITY): Payer: Self-pay | Admitting: *Deleted

## 2020-12-18 DIAGNOSIS — R079 Chest pain, unspecified: Secondary | ICD-10-CM

## 2020-12-18 DIAGNOSIS — E785 Hyperlipidemia, unspecified: Secondary | ICD-10-CM

## 2020-12-18 DIAGNOSIS — I2510 Atherosclerotic heart disease of native coronary artery without angina pectoris: Secondary | ICD-10-CM | POA: Diagnosis not present

## 2020-12-18 DIAGNOSIS — I10 Essential (primary) hypertension: Secondary | ICD-10-CM | POA: Diagnosis not present

## 2020-12-18 DIAGNOSIS — E1159 Type 2 diabetes mellitus with other circulatory complications: Secondary | ICD-10-CM

## 2020-12-18 LAB — LIPID PANEL
Cholesterol: 235 mg/dL — ABNORMAL HIGH (ref 0–200)
HDL: 50 mg/dL (ref >40)
LDL Cholesterol: 165 mg/dL — ABNORMAL HIGH (ref 0–99)
Total CHOL/HDL Ratio: 4.7 RATIO
Triglycerides: 99 mg/dL (ref <150)
VLDL: 20 mg/dL (ref 0–40)

## 2020-12-18 LAB — BASIC METABOLIC PANEL
Anion gap: 11 (ref 5–15)
BUN: 9 mg/dL (ref 6–20)
CO2: 23 mmol/L (ref 22–32)
Calcium: 9 mg/dL (ref 8.9–10.3)
Chloride: 103 mmol/L (ref 98–111)
Creatinine, Ser: 0.98 mg/dL (ref 0.61–1.24)
GFR, Estimated: >60 mL/min (ref >60)
Glucose, Bld: 113 mg/dL — ABNORMAL HIGH (ref 70–99)
Potassium: 3.9 mmol/L (ref 3.5–5.1)
Sodium: 137 mmol/L (ref 135–145)

## 2020-12-18 LAB — ECHOCARDIOGRAM COMPLETE
Area-P 1/2: 5.66 cm2
Calc EF: 55.5 %
S' Lateral: 3.6 cm
Single Plane A2C EF: 57.4 %
Single Plane A4C EF: 53.5 %

## 2020-12-18 LAB — SARS CORONAVIRUS 2 (TAT 6-24 HRS): SARS Coronavirus 2: NEGATIVE

## 2020-12-18 LAB — HIV ANTIBODY (ROUTINE TESTING W REFLEX): HIV Screen 4th Generation wRfx: NONREACTIVE

## 2020-12-18 MED ORDER — ROSUVASTATIN CALCIUM 5 MG PO TABS
10.0000 mg | ORAL_TABLET | Freq: Every day | ORAL | Status: DC
  Administered 2020-12-18: 10 mg via ORAL
  Filled 2020-12-18: qty 2

## 2020-12-18 MED ORDER — ASPIRIN 81 MG PO TBEC: 81.0000 mg | DELAYED_RELEASE_TABLET | Freq: Every day | ORAL | 11 refills | Status: DC

## 2020-12-18 MED ORDER — NITROGLYCERIN 0.4 MG SL SUBL: 0.4000 mg | SUBLINGUAL_TABLET | SUBLINGUAL | 0 refills | Status: DC | PRN

## 2020-12-18 MED ORDER — ROSUVASTATIN CALCIUM 10 MG PO TABS: 10.0000 mg | ORAL_TABLET | Freq: Every day | ORAL | 2 refills | Status: DC

## 2020-12-18 MED ORDER — AMLODIPINE BESYLATE 5 MG PO TABS
5.0000 mg | ORAL_TABLET | Freq: Every day | ORAL | Status: DC
  Administered 2020-12-18: 5 mg via ORAL
  Filled 2020-12-18: qty 1

## 2020-12-18 MED ORDER — AMLODIPINE BESYLATE 5 MG PO TABS: 5.0000 mg | ORAL_TABLET | Freq: Every day | ORAL | 2 refills | Status: DC

## 2020-12-18 NOTE — ED Notes (Signed)
Notified Dr. Bjorn Pippin that this patient has a bed ready. He said he will discharge pt soon, charge RN notified.

## 2020-12-18 NOTE — ED Notes (Signed)
Tele Breakfast order placed 

## 2020-12-18 NOTE — Progress Notes (Addendum)
Progress Note  Patient Name: Ernest Williams Date of Encounter: 12/18/2020  [facility] Cardiologist: No primary care provider on file.   Subjective   Underwent coronary CTA yesterday, showed nonobstructive CAD (calcium score 26, 85th percentile).  BP 157/100, pulse 82 this morning.  Creatinine stable at 0.98.  LDL 165.  Reports pain improved.  Inpatient Medications    Scheduled Meds: . aspirin EC  81 mg Oral Daily  . enoxaparin (LOVENOX) injection  40 mg Subcutaneous Q24H  . lisinopril  20 mg Oral QHS   Continuous Infusions:  PRN Meds: acetaminophen, cyclobenzaprine, gabapentin, nitroGLYCERIN, ondansetron (ZOFRAN) IV   Vital Signs    Vitals:   12/18/20 0000 12/18/20 0215 12/18/20 0430 12/18/20 0906  BP: (!) 167/97 130/89 (!) 149/95 (!) 157/100  Pulse: 61 69 65 82  Resp: 16 (!) 22 16 15   Temp:    98.7 F (37.1 C)  TempSrc:    Oral  SpO2: 96% 98% 99% 100%   No intake or output data in the 24 hours ending 12/18/20 0927 Last 3 Weights 03/30/2020 01/20/2020 01/22/2018  Weight (lbs) 239 lb 240 lb 260 lb  Weight (kg) 108.41 kg 108.863 kg 117.935 kg      Telemetry    NSR - Personally Reviewed  ECG    No new ECG - Personally Reviewed  Physical Exam   GEN: No acute distress.   Neck: No JVD Cardiac: RRR, no murmurs, rubs, or gallops.  Respiratory: Clear to auscultation bilaterally. GI: Soft, nontender, non-distended  MS: No edema; No deformity. Neuro:  Nonfocal  Psych: Normal affect   Labs    High Sensitivity Troponin:   Recent Labs  Lab 12/17/20 0756 12/17/20 1415  TROPONINIHS 7 6      Chemistry Recent Labs  Lab 12/17/20 0756 12/18/20 0444  NA 141 137  K 4.4 3.9  CL 105 103  CO2 27 23  GLUCOSE 119* 113*  BUN 14 9  CREATININE 1.04 0.98  CALCIUM 9.0 9.0  GFRNONAA >60 >60  ANIONGAP 9 11     Hematology Recent Labs  Lab 12/17/20 0756  WBC 7.4  RBC 4.60  HGB 14.1  HCT 43.8  MCV 95.2  MCH 30.7  MCHC 32.2  RDW 13.7  PLT 238     BNPNo results for input(s): BNP, PROBNP in the last 168 hours.   DDimer No results for input(s): DDIMER in the last 168 hours.   Radiology    DG Chest 2 View  Result Date: 12/17/2020 CLINICAL DATA:  50 year old male with left side chest pain radiating to the arm and neck. Shortness of breath. Former smoker. EXAM: CHEST - 2 VIEW COMPARISON:  Chest radiographs 09/28/2019 and earlier. FINDINGS: PA and lateral views. Lung volumes and mediastinal contours remain normal. Visualized tracheal air column is within normal limits. Subtle chronic increased interstitial markings about the right hilum appear stable since 2019. No pneumothorax, pulmonary edema, pleural effusion or acute pulmonary opacity. No acute osseous abnormality identified. Negative visible bowel gas pattern. IMPRESSION: No acute cardiopulmonary abnormality. Electronically Signed   By: 2020 M.D.   On: 12/17/2020 07:43   CT CORONARY MORPH W/CTA COR W/SCORE W/CA W/CM &/OR WO/CM  Addendum Date: 12/18/2020   ADDENDUM REPORT: 12/18/2020 08:00 EXAM: OVER-READ INTERPRETATION  CT CHEST The following report is an over-read performed by radiologist Dr. 02/15/2021 [facility] on 12/18/2020. This over-read does not include interpretation of cardiac or coronary anatomy or pathology. The coronary CTA interpretation by the cardiologist  is attached. COMPARISON:  None. FINDINGS: No incidental vascular findings. Visualized mediastinum and hilar regions demonstrate no lymphadenopathy or masses. Visualized lungs show evidence of pulmonary edema, consolidation, pneumothorax, nodule or pleural fluid. Visualized upper abdomen and bony structures are unremarkable. IMPRESSION: No significant incidental findings. Electronically Signed   By: Irish Lack M.D.   On: 12/18/2020 08:00   Result Date: 12/18/2020 CLINICAL DATA:  50 year old male with significant family history of early CAD, uncontrolled hypertension and chest pain. EXAM:  Cardiac/Coronary  CTA TECHNIQUE: The patient was scanned on a [employer]. FINDINGS: A 100 kV prospective scan was triggered in the descending thoracic aorta at 111 HU's. Axial non-contrast 3 mm slices were carried out through the heart. The data set was analyzed on a dedicated work station and scored using the Agatson method. Gantry rotation speed was 250 msecs and collimation was .6 mm. 100 mg of PO Metoprolol and 0.8 mg of sl NTG was given. The 3D data set was reconstructed in 5% intervals of the 67-82 % of the R-R cycle. Diastolic phases were analyzed on a dedicated work station using MPR, MIP and VRT modes. The patient received 80 cc of contrast. Aorta:  Normal size.  No calcifications.  No dissection. Aortic Valve:  Trileaflet.  No calcifications. Coronary Arteries:  Normal coronary origin.  Right dominance. RCA is a large dominant artery that gives rise to PDA and PLA. There is mild non-calcified plaque in the proximal to mid RCA with stenosis 25-49%. Mid and distal RCA/PDA and PLA have only minimal irregularities. Left main is a large caliber artery that gives rise to LAD and LCX arteries. Left main has no plaque. LAD is a large caliber vessel that wraps around the apex and gives rise to a diagonal artery. Mid LAD has mild mixed predominantly calcified plaque with stenosis 25-49%. D1 is a medium size artery with only minimal luminal irregularities. LCX is a small non-dominant artery that gives rise to two large OM branches. There minimal plaque. OM1, OM2 are medium sized branches with only minimal irregularities. Other findings: Normal pulmonary vein drainage into the left atrium. Normal left atrial appendage without a thrombus. Normal size of the pulmonary artery. IMPRESSION: 1. Coronary calcium score of 26. This was 8 percentile for age and sex matched control. 2. Normal coronary origin with right dominance. 3. CAD-RADS 2. Mild non-obstructive CAD (25-49%). Consider non-atherosclerotic causes  of chest pain. Consider preventive therapy and risk factor modification. Electronically Signed: By: Tobias Alexander On: 12/17/2020 18:06    Cardiac Studies     Patient Profile     50 y.o. male with prior medical history of hypertension, peripheral neuropathy of unknown etiology and very significant family history of premature coronary artery disease who presents with chest pain  Assessment & Plan   Chest pain: Normal EKG/troponins.  Underwent coronary CTA yesterday, showed nonobstructive CAD (calcium score 26, 85th percentile).  -Echo reviewed: appears normal systolic function, no significant valvular disease -LDL 165, will start rosuvastatin 10 mg daily -BP remains elevated despite holding lisinopril, will add amlodipine 5 mg daily  HLD: LDL 165 with nonobstructive CAD on coronary CTA.  Start rosuvastatin 10 mg daily  HTN: continue lisinopril 20 mg daily, will add amlodipine 5 mg daily  Disposition: plan for discharge today with outpatient f/u in next month  For questions or updates, please contact [facility] Please consult [email or web address] for contact info under        Signed, [name], MD  12/18/2020, 9:27  AM

## 2020-12-18 NOTE — Progress Notes (Signed)
  Echocardiogram 2D Echocardiogram has been performed.  Ernest Williams 12/18/2020, 9:46 AM

## 2020-12-18 NOTE — Discharge Summary (Signed)
Discharge Summary    Patient ID: Ernest Williams MRN: [id number]; DOB: 04/23/1971  Admit date: 12/17/2020 Discharge date: 12/18/2020  Primary Care Provider: Patient, No Pcp Per  Primary Cardiologist: New to [facility], Dr. Bjorn Pippin, MD  Discharge Diagnoses    Active Problems:   Chest pain   HLD (hyperlipidemia)   CAD (coronary artery disease)   HTN (hypertension)  Diagnostic Studies/Procedures    Coronary CTA 12/17/20:  Aorta:  Normal size.  No calcifications.  No dissection.  Aortic Valve:  Trileaflet.  No calcifications.  Coronary Arteries:  Normal coronary origin.  Right dominance.  RCA is a large dominant artery that gives rise to PDA and PLA. There is mild non-calcified plaque in the proximal to mid RCA with stenosis 25-49%. Mid and distal RCA/PDA and PLA have only minimal irregularities.  Left main is a large caliber artery that gives rise to LAD and LCX arteries. Left main has no plaque.  LAD is a large caliber vessel that wraps around the apex and gives rise to a diagonal artery. Mid LAD has mild mixed predominantly calcified plaque with stenosis 25-49%.  D1 is a medium size artery with only minimal luminal irregularities.  LCX is a small non-dominant artery that gives rise to two large OM branches. There minimal plaque.  OM1, OM2 are medium sized branches with only minimal irregularities.  Other findings:  Normal pulmonary vein drainage into the left atrium.  Normal left atrial appendage without a thrombus.  Normal size of the pulmonary artery.  IMPRESSION: 1. Coronary calcium score of 26. This was 37 percentile for age and sex matched control.  2. Normal coronary origin with right dominance.  3. CAD-RADS 2. Mild non-obstructive CAD (25-49%). Consider non-atherosclerotic causes of chest pain. Consider preventive therapy and risk factor modification.  History of Present Illness     Ernest Williams is a 50 y.o. male with a hx of  hypertension, peripheral neuropathy of unknown etiology, obesity and significant family history of premature coronary artery disease specifically with sudden cardiac death with MI in his sister at age 35 who also had Prader-Willi syndrome, a brother who died of MI at age 70 and his mother died at age 48 of MI who was seen by cardiology for the evaluation of chest pain.   He reported that he had never had his lipids checked and is minimally active at baseline. He states that being active aggravates his peripheral neuropathy. He has a sedentary job and does not have a primary care physician. He was seen in an urgent care center 2 weeks ago and received antihypertensives however he ran out of his medications. Prior to hospital arrival, he has noticed mostly exertional shortness of breath with pressure-like chest pain radiating to his arms which would resolve with rest.  Given this, he was admitted by cardiology for observation with plan for CCTA.    Hospital Course   Cardiac CTA performed 12/17/20 which showed non-obstructive CAD with a calcium score at 26 placing him at 85th percentile for age and sex matched control. Lipid panel showed an LDL at 165 and his BP was noted to be elevated at 157/100. Plan is to start rosuvastatin 10mg  PO QD and follow labs. In regards to his hypertension, amlodipine was added to his regimen in addition to his lisinopril. Plan is for follow up with Dr. next month.      Chest pain:  -Normal EKG/troponins.  Underwent coronary CTA yesterday, showed nonobstructive CAD (calcium score 26, 85th percentile).  -Echo  reviewed: appears normal systolic function, no significant valvular disease -LDL 165, will start rosuvastatin 10 mg daily -BP remains elevated despite holding lisinopril, will add amlodipine 5 mg daily  HLD:  -LDL 165 with nonobstructive CAD on coronary CTA.  Start rosuvastatin 10 mg daily  HTN:  -Continue lisinopril 20 mg daily, will add amlodipine 5 mg  daily  Consultants: None   The patient has been seen and examined by Dr. Bjorn Pippin who feel that he is stable and ready for discharge today, 12/18/20.   Did the patient have an acute coronary syndrome (MI, NSTEMI, STEMI, etc) this admission?:  No                               Did the patient have a percutaneous coronary intervention (stent / angioplasty)?:  No.    Discharge Vitals Blood pressure (!) 141/104, pulse 67, temperature 98.7 F (37.1 C), temperature source Oral, resp. rate 19, SpO2 95 %.  There were no vitals filed for this visit.  Labs & Radiologic Studies    CBC Recent Labs    12/17/20 0756  WBC 7.4  HGB 14.1  HCT 43.8  MCV 95.2  PLT 238   Basic Metabolic Panel Recent Labs    50/35/46 0756 12/18/20 0444  NA 141 137  K 4.4 3.9  CL 105 103  CO2 27 23  GLUCOSE 119* 113*  BUN 14 9  CREATININE 1.04 0.98  CALCIUM 9.0 9.0   Liver Function Tests No results for input(s): AST, ALT, ALKPHOS, BILITOT, PROT, ALBUMIN in the last 72 hours. No results for input(s): LIPASE, AMYLASE in the last 72 hours. High Sensitivity Troponin:   Recent Labs  Lab 12/17/20 0756 12/17/20 1415  TROPONINIHS 7 6    BNP Invalid input(s): POCBNP D-Dimer No results for input(s): DDIMER in the last 72 hours. Hemoglobin A1C Recent Labs    12/17/20 1740  HGBA1C 6.1*   Fasting Lipid Panel Recent Labs    12/18/20 0444  CHOL 235*  HDL 50  LDLCALC 165*  TRIG 99  CHOLHDL 4.7   Thyroid Function Tests No results for input(s): TSH, T4TOTAL, T3FREE, THYROIDAB in the last 72 hours.  Invalid input(s): FREET3 _____________  DG Chest 2 View  Result Date: 12/17/2020 CLINICAL DATA:  50 year old male with left side chest pain radiating to the arm and neck. Shortness of breath. Former smoker. EXAM: CHEST - 2 VIEW COMPARISON:  Chest radiographs 09/28/2019 and earlier. FINDINGS: PA and lateral views. Lung volumes and mediastinal contours remain normal. Visualized tracheal air column is within  normal limits. Subtle chronic increased interstitial markings about the right hilum appear stable since 2019. No pneumothorax, pulmonary edema, pleural effusion or acute pulmonary opacity. No acute osseous abnormality identified. Negative visible bowel gas pattern. IMPRESSION: No acute cardiopulmonary abnormality. Electronically Signed   By: Odessa Fleming M.D.   On: 12/17/2020 07:43   CT CORONARY MORPH W/CTA COR W/SCORE W/CA W/CM &/OR WO/CM  Addendum Date: 12/18/2020   ADDENDUM REPORT: 12/18/2020 08:00 EXAM: OVER-READ INTERPRETATION  CT CHEST The following report is an over-read performed by radiologist Dr. Marinda Elk [facility] on 12/18/2020. This over-read does not include interpretation of cardiac or coronary anatomy or pathology. The coronary CTA interpretation by the cardiologist is attached. COMPARISON:  None. FINDINGS: No incidental vascular findings. Visualized mediastinum and hilar regions demonstrate no lymphadenopathy or masses. Visualized lungs show evidence of pulmonary edema, consolidation, pneumothorax, nodule or pleural  fluid. Visualized upper abdomen and bony structures are unremarkable. IMPRESSION: No significant incidental findings. Electronically Signed   By: Irish Lack M.D.   On: 12/18/2020 08:00   Result Date: 12/18/2020 CLINICAL DATA:  50 year old male with significant family history of early CAD, uncontrolled hypertension and chest pain. EXAM: Cardiac/Coronary  CTA TECHNIQUE: The patient was scanned on a [employer]. FINDINGS: A 100 kV prospective scan was triggered in the descending thoracic aorta at 111 HU's. Axial non-contrast 3 mm slices were carried out through the heart. The data set was analyzed on a dedicated work station and scored using the Agatson method. Gantry rotation speed was 250 msecs and collimation was .6 mm. 100 mg of PO Metoprolol and 0.8 mg of sl NTG was given. The 3D data set was reconstructed in 5% intervals of the 67-82 % of the R-R  cycle. Diastolic phases were analyzed on a dedicated work station using MPR, MIP and VRT modes. The patient received 80 cc of contrast. Aorta:  Normal size.  No calcifications.  No dissection. Aortic Valve:  Trileaflet.  No calcifications. Coronary Arteries:  Normal coronary origin.  Right dominance. RCA is a large dominant artery that gives rise to PDA and PLA. There is mild non-calcified plaque in the proximal to mid RCA with stenosis 25-49%. Mid and distal RCA/PDA and PLA have only minimal irregularities. Left main is a large caliber artery that gives rise to LAD and LCX arteries. Left main has no plaque. LAD is a large caliber vessel that wraps around the apex and gives rise to a diagonal artery. Mid LAD has mild mixed predominantly calcified plaque with stenosis 25-49%. D1 is a medium size artery with only minimal luminal irregularities. LCX is a small non-dominant artery that gives rise to two large OM branches. There minimal plaque. OM1, OM2 are medium sized branches with only minimal irregularities. Other findings: Normal pulmonary vein drainage into the left atrium. Normal left atrial appendage without a thrombus. Normal size of the pulmonary artery. IMPRESSION: 1. Coronary calcium score of 26. This was 18 percentile for age and sex matched control. 2. Normal coronary origin with right dominance. 3. CAD-RADS 2. Mild non-obstructive CAD (25-49%). Consider non-atherosclerotic causes of chest pain. Consider preventive therapy and risk factor modification. Electronically Signed: By: Tobias Alexander On: 12/17/2020 18:06   Disposition   Pt is being discharged home today in good condition.  Follow-up Plans & Appointments    Follow-up Information    [name], MD Follow up on 01/17/2021.   Specialties: Cardiology, Radiology Why: at [facility] information: [address] [place] [address] [phone]              Discharge Instructions    Call MD for:   difficulty breathing, headache or visual disturbances   Complete by: As directed    Call MD for:  extreme fatigue   Complete by: As directed    Call MD for:  hives   Complete by: As directed    Call MD for:  persistant dizziness or light-headedness   Complete by: As directed    Call MD for:  persistant nausea and vomiting   Complete by: As directed    Call MD for:  redness, tenderness, or signs of infection (pain, swelling, redness, odor or green/yellow discharge around incision site)   Complete by: As directed    Call MD for:  severe uncontrolled pain   Complete by: As directed    Call MD  for:  temperature >100.4   Complete by: As directed    Diet - low sodium heart healthy   Complete by: As directed    Discharge instructions   Complete by: As directed    Lifestyle changes: -Diet: We recommend a diet low in sodium, low in fat and carbohydrates and high in vegetables, fruits and grains.  -Moderate alcohol consumption -Reduce your sodium intake to no more than 2,400mg /day -Physical Activity: Moderate to vigorous activity 3-4 days a week averaging at least 40 min per session.   Increase activity slowly   Complete by: As directed      Discharge Medications   Allergies as of 12/18/2020      Reactions   Tomato Anaphylaxis   Pear    Upset stomach   Strawberry (diagnostic) Swelling   Swelling in mouth Upset stomach      Medication List    TAKE these medications   amLODipine 5 MG tablet Commonly known as: NORVASC Take 1 tablet (5 mg total) by mouth daily. Start taking on: December 19, 2020   aspirin 81 MG EC tablet Take 1 tablet (81 mg total) by mouth daily. Swallow whole. Start taking on: December 19, 2020   cyclobenzaprine 10 MG tablet Commonly known as: FLEXERIL Take 1 tablet (10 mg total) by mouth 3 (three) times daily as needed for muscle spasms.   diphenhydrAMINE 25 MG tablet Commonly known as: BENADRYL Take 25 mg by mouth daily as needed.   gabapentin 300 MG  capsule Commonly known as: Neurontin Take 1 capsule (300 mg total) by mouth 3 (three) times daily. What changed:   when to take this  reasons to take this   L-ARGININE PO Take 1 tablet by mouth daily.   lisinopril 20 MG tablet Commonly known as: ZESTRIL Take 1 tablet (20 mg total) by mouth daily. What changed: when to take this   nitroGLYCERIN 0.4 MG SL tablet Commonly known as: NITROSTAT Place 1 tablet (0.4 mg total) under the tongue every 5 (five) minutes x 3 doses as needed for chest pain.   rosuvastatin 10 MG tablet Commonly known as: CRESTOR Take 1 tablet (10 mg total) by mouth daily. Start taking on: December 19, 2020   VITAMIN B-12 PO Take 1 tablet by mouth daily.   VITAMIN C PO Take by mouth daily.   VITAMIN D3 PO Take 1 tablet by mouth daily.       Outstanding Labs/Studies   None   Duration of Discharge Encounter   Greater than 30 minutes including physician time.  Signed, Georgie Chard, NP 12/18/2020, 1:28 PM

## 2020-12-27 DIAGNOSIS — M25532 Pain in left wrist: Secondary | ICD-10-CM | POA: Diagnosis not present

## 2020-12-27 DIAGNOSIS — R200 Anesthesia of skin: Secondary | ICD-10-CM | POA: Diagnosis not present

## 2020-12-27 DIAGNOSIS — R202 Paresthesia of skin: Secondary | ICD-10-CM | POA: Diagnosis not present

## 2021-01-04 DIAGNOSIS — M25532 Pain in left wrist: Secondary | ICD-10-CM | POA: Diagnosis not present

## 2021-01-04 DIAGNOSIS — S52572S Other intraarticular fracture of lower end of left radius, sequela: Secondary | ICD-10-CM | POA: Diagnosis not present

## 2021-01-14 NOTE — Progress Notes (Signed)
Cardiology Office Note:    Date:  01/17/2021   ID:  Ernest Williams, DOB 06/20/1971, MRN [id number]  PCP:  Patient, No Pcp Per  Cardiologist:  No primary care provider on file.  Electrophysiologist:  None   Referring MD: No ref. provider found   Chief Complaint  Patient presents with  . Coronary Artery Disease    History of Present Illness:    Ernest Williams is a 50 y.o. male with a hx of hypertension, peripheral neuropathy of unknown etiology, significant family history of early CAD who presents for hospital follow-up.  He was admitted from 12/17/2020 through 12/18/2020 to [facility] with chest pain.  EKG unremarkable, negative troponins.  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  Echocardiogram showed normal biventricular function, no significant valvular disease.  Since discharge from the hospital, he reports that he has been doing okay.  Has continued to have intermittent chest pain and dyspnea, but has improved.  Does report some lightheadedness but denies any syncope.  He denies any lower extremity edema.  He has not been exercising.  Reports BP has been 140s 150s when he checks at home.  Planning surgery on left wrist on Monday, anesthesia will be axillary block/MAC.  He quit smoking in 2017.  Mother had MI in 61s.  Sister reportedly died of MI at age 77   Past Medical History:  Diagnosis Date  . Degeneration of spine   . Family history of adverse reaction to anesthesia    mother--- ponv  . History of concussion    09/ 2013  no loc,  no residual  . Hypertension    goes to urgent care , no regular pcp,  (stress echo 12-21-2009 in epic, normal)  . Peripheral neuropathy    per pt states told due to degenerative spine  . Wears glasses   . Wrist fracture, closed, left, initial encounter 03/25/2020    Past Surgical History:  Procedure Laterality Date  . NO PAST SURGERIES    . ORIF WRIST FRACTURE Left 03/31/2020   Procedure: OPEN REDUCTION  INTERNAL FIXATION (ORIF) WRIST FRACTURE;  Surgeon: Renette Butters, MD;  Location: [facility];  Service: Orthopedics;  Laterality: Left;    Current Medications: Current Meds  Medication Sig  . Ascorbic Acid (VITAMIN C PO) Take by mouth daily.  Marland Kitchen aspirin EC 81 MG EC tablet Take 1 tablet (81 mg total) by mouth daily. Swallow whole.  . Cholecalciferol (VITAMIN D3 PO) Take 1 tablet by mouth daily.  . Cyanocobalamin (VITAMIN B-12 PO) Take 1 tablet by mouth daily.  . cyclobenzaprine (FLEXERIL) 10 MG tablet Take 1 tablet (10 mg total) by mouth 3 (three) times daily as needed for muscle spasms.  . diphenhydrAMINE (BENADRYL) 25 MG tablet Take 25 mg by mouth daily as needed.  . gabapentin (NEURONTIN) 300 MG capsule Take 1 capsule (300 mg total) by mouth 3 (three) times daily. (Patient taking differently: Take 300 mg by mouth 3 (three) times daily as needed.)  . L-ARGININE PO Take 1 tablet by mouth daily.  Marland Kitchen lisinopril (ZESTRIL) 20 MG tablet Take 1 tablet (20 mg total) by mouth daily. (Patient taking differently: Take 20 mg by mouth at bedtime.)  . nitroGLYCERIN (NITROSTAT) 0.4 MG SL tablet Place 1 tablet (0.4 mg total) under the tongue every 5 (five) minutes x 3 doses as needed for chest pain.  . rosuvastatin (CRESTOR) 10 MG tablet Take 1 tablet (10 mg total) by mouth daily.  . [  DISCONTINUED] amLODipine (NORVASC) 5 MG tablet Take 1 tablet (5 mg total) by mouth daily.     Allergies:   Fish allergy, Tomato, Pear, and Strawberry (diagnostic)   Social History   Socioeconomic History  . Marital status: Single    Spouse name: Not on file  . Number of children: Not on file  . Years of education: Not on file  . Highest education level: Not on file  Occupational History  . Not on file  Tobacco Use  . Smoking status: Former Smoker    Years: 10.00    Types: Cigarettes    Quit date: 2017    Years since quitting: 5.1  . Smokeless tobacco: Never Used  Vaping Use  . Vaping Use: Some  days  . Substances: CBD  . Devices: vapeppresso  Substance and Sexual Activity  . Alcohol use: Yes    Comment: occasional  . Drug use: Yes    Types: Marijuana    Comment: 03-30-2020 per pt smokes average 2-3g per week for neuropathy  . Sexual activity: Not on file  Other Topics Concern  . Not on file  Social History Narrative  . Not on file   Social Determinants of Health   Financial Resource Strain: Not on file  Food Insecurity: Not on file  Transportation Needs: Not on file  Physical Activity: Not on file  Stress: Not on file  Social Connections: Not on file     Family History: The patient's family history includes Diabetes in his mother; Healthy in his father.  ROS:   Please see the history of present illness.     All other systems reviewed and are negative.  EKGs/Labs/Other Studies Reviewed:    The following studies were reviewed today:   EKG:  EKG is not ordered today.    Recent Labs: 12/17/2020: Hemoglobin 14.1; Platelets 238 12/18/2020: BUN 9; Creatinine, Ser 0.98; Potassium 3.9; Sodium 137  Recent Lipid Panel    Component Value Date/Time   CHOL 235 (H) 12/18/2020 0444   TRIG 99 12/18/2020 0444   HDL 50 12/18/2020 0444   CHOLHDL 4.7 12/18/2020 0444   VLDL 20 12/18/2020 0444   LDLCALC 165 (H) 12/18/2020 0444    Physical Exam:    VS:  BP 136/90   Pulse 88   Ht _0  (1.88 m)   Wt 266 lb 3.2 oz (120.7 kg)   SpO2 94%   BMI 34.18 kg/m     Wt Readings from Last 3 Encounters:  01/17/21 266 lb 3.2 oz (120.7 kg)  03/30/20 239 lb (108.4 kg)  01/20/20 240 lb (108.9 kg)     GEN:  Well nourished, well developed in no acute distress HEENT: Normal NECK: No JVD; No carotid bruits LYMPHATICS: No lymphadenopathy CARDIAC: RRR, no murmurs, rubs, gallops RESPIRATORY:  Clear to auscultation without rales, wheezing or rhonchi  ABDOMEN: Soft, non-tender, non-distended MUSCULOSKELETAL:  No edema; No deformity  SKIN: Warm and dry NEUROLOGIC:  Alert and oriented  x 3 PSYCHIATRIC:  Normal affect   ASSESSMENT:    1. CAD in native artery   2. Preoperative examination   3. Hyperlipidemia, unspecified hyperlipidemia type   4. Essential hypertension    PLAN:     Preop evaluation: Low risk surgery, will be done with axillary block/MAC.  Recent coronary CTA showed nonobstructive CAD and echocardiogram showed no structural heart disease.  No further cardiac work-up recommended prior to surgery.  No history of obstructive CAD/PCI, OK to hold aspirin for surgery  CAD:  He was admitted from 12/17/2020 through 12/18/2020 to [facility] with chest pain.  EKG unremarkable, negative troponins.  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  Echocardiogram showed normal biventricular function, no significant valvular disease. -Continue aspirin 81 mg -Continue rosuvastatin 10 mg daily  HLD: LDL 165 with nonobstructive CAD on coronary CTA.  Started rosuvastatin 10 mg daily.  Will repeat fasting lipid panel in 1 month, goal LDL less than 70  HTN: continue lisinopril 20 mg daily and amlodipine 5 mg daily.  BP appears elevated, will increase amlodipine to 10 mg daily.  Asked patient to check BP daily for next 2 weeks and call with results.  RTC in 6 months  Medication Adjustments/Labs and Tests Ordered: Current medicines are reviewed at length with the patient today.  Concerns regarding medicines are outlined above.  Orders Placed This Encounter  Procedures  . Lipid panel   Meds ordered this encounter  Medications  . amLODipine (NORVASC) 10 MG tablet    Sig: Take 1 tablet (10 mg total) by mouth daily.    Dispense:  90 tablet    Refill:  3    Dose increase    Patient Instructions  Medication Instructions:  INCREASE amlodipine to 10 mg daily  Please check your blood pressure at home daily, write it down.  Call the office or send message via Mychart with the readings in 2 weeks for Dr. Gardiner Rhyme to review.   *If you need  a refill on your cardiac medications before your next appointment, please call your pharmacy*   Lab Work: Please return for FASTING labs in 1 month (Lipid)  Our in office lab hours are Monday-Friday 8:00-4:00, closed for lunch 12:45-1:45 pm.  No appointment needed.  Follow-Up: At [facility], you and your health needs are our priority.  As part of our continuing mission to provide you with exceptional heart care, we have created designated Provider Care Teams.  These Care Teams include your primary Cardiologist (physician) and Advanced Practice Providers (APPs -  Physician Assistants and Nurse Practitioners) who all work together to provide you with the care you need, when you need it.  We recommend signing up for the patient portal called "MyChart".  Sign up information is provided on this After Visit Summary.  MyChart is used to connect with patients for Virtual Visits (Telemedicine).  Patients are able to view lab/test results, encounter notes, upcoming appointments, etc.  Non-urgent messages can be sent to your provider as well.   To learn more about what you can do with MyChart, go to [email or web address].    Your next appointment:   6 month(s)  The format for your next appointment:   In Person  Provider:   Oswaldo Milian, MD   Other Instructions Ok to hold aspirin for upcoming surgery if needed.      Signed, Donato Heinz, MD  01/17/2021 9:38 AM    [place]

## 2021-01-17 ENCOUNTER — Other Ambulatory Visit: Payer: Self-pay

## 2021-01-17 ENCOUNTER — Ambulatory Visit (INDEPENDENT_AMBULATORY_CARE_PROVIDER_SITE_OTHER): Payer: BC Managed Care – PPO | Admitting: Cardiology

## 2021-01-17 ENCOUNTER — Encounter: Payer: Self-pay | Admitting: Cardiology

## 2021-01-17 VITALS — BP 136/90 | HR 88 | Ht 74.0 in | Wt 266.2 lb

## 2021-01-17 DIAGNOSIS — E785 Hyperlipidemia, unspecified: Secondary | ICD-10-CM | POA: Diagnosis not present

## 2021-01-17 DIAGNOSIS — I2510 Atherosclerotic heart disease of native coronary artery without angina pectoris: Secondary | ICD-10-CM | POA: Diagnosis not present

## 2021-01-17 DIAGNOSIS — Z01818 Encounter for other preprocedural examination: Secondary | ICD-10-CM

## 2021-01-17 DIAGNOSIS — I10 Essential (primary) hypertension: Secondary | ICD-10-CM | POA: Diagnosis not present

## 2021-01-17 MED ORDER — AMLODIPINE BESYLATE 10 MG PO TABS: 10.0000 mg | ORAL_TABLET | Freq: Every day | ORAL | 3 refills | Status: DC

## 2021-01-17 NOTE — Patient Instructions (Signed)
Medication Instructions:  INCREASE amlodipine to 10 mg daily  Please check your blood pressure at home daily, write it down.  Call the office or send message via Mychart with the readings in 2 weeks for Dr. Bjorn Pippin to review.   *If you need a refill on your cardiac medications before your next appointment, please call your pharmacy*   Lab Work: Please return for FASTING labs in 1 month (Lipid)  Our in office lab hours are Monday-Friday 8:00-4:00, closed for lunch 12:45-1:45 pm.  No appointment needed.  Follow-Up: At [facility], you and your health needs are our priority.  As part of our continuing mission to provide you with exceptional heart care, we have created designated Provider Care Teams.  These Care Teams include your primary Cardiologist (physician) and Advanced Practice Providers (APPs -  Physician Assistants and Nurse Practitioners) who all work together to provide you with the care you need, when you need it.  We recommend signing up for the patient portal called "MyChart".  Sign up information is provided on this After Visit Summary.  MyChart is used to connect with patients for Virtual Visits (Telemedicine).  Patients are able to view lab/test results, encounter notes, upcoming appointments, etc.  Non-urgent messages can be sent to your provider as well.   To learn more about what you can do with MyChart, go to [email or web address].    Your next appointment:   6 month(s)  The format for your next appointment:   In Person  Provider:   Epifanio Lesches, MD   Other Instructions Ok to hold aspirin for upcoming surgery if needed.

## 2021-03-08 ENCOUNTER — Encounter (HOSPITAL_COMMUNITY): Payer: Self-pay

## 2021-03-08 ENCOUNTER — Other Ambulatory Visit: Payer: Self-pay

## 2021-03-08 ENCOUNTER — Ambulatory Visit (HOSPITAL_COMMUNITY)
Admission: EM | Admit: 2021-03-08 | Discharge: 2021-03-08 | Disposition: A | Discharge status: Home / Self Care | Payer: BC Managed Care – PPO | Attending: Emergency Medicine | Admitting: Emergency Medicine

## 2021-03-08 DIAGNOSIS — M79632 Pain in left forearm: Secondary | ICD-10-CM

## 2021-03-08 DIAGNOSIS — G8929 Other chronic pain: Secondary | ICD-10-CM

## 2021-03-08 DIAGNOSIS — M25532 Pain in left wrist: Secondary | ICD-10-CM

## 2021-03-08 MED ORDER — CAPSAICIN 0.1 % EX CREA: 1.0000 "application " | TOPICAL_CREAM | Freq: Four times a day (QID) | CUTANEOUS | 0 refills | Status: DC | PRN

## 2021-03-08 MED ORDER — MELOXICAM 15 MG PO TABS: 15.0000 mg | ORAL_TABLET | Freq: Every day | ORAL | 0 refills | Status: AC

## 2021-03-08 MED ORDER — GABAPENTIN 300 MG PO CAPS: ORAL_CAPSULE | ORAL | 0 refills | Status: DC

## 2021-03-08 NOTE — Discharge Instructions (Addendum)
Restart the Neurontin, try the capsaicin cream.  Take Mobic in the morning.  Take it as milligrams of Tylenol with it.  May take an additional past milligrams Tylenol 3 more times a day as needed.  Follow-up with [facility].  I have placed a referral for you.   Below is a list of primary care practices who are taking new patients for you to follow-up with.  [facility] internal medicine clinic Ground Floor - [facility], [address] [place], [place], [place] [address] [phone]  [facility] at [facility] [address] [place], [place] [address] [phone]  [facility] [address] [place], [place] [address] [phone]  [facility] [phone] [address] [place] [address]  [facility]: [address] [place] [address]  [phone]  [facility]: [address] [place] [address]   [phone]  [facility]: [address] [place] [address]  [phone]  [facility] primary [facility] : [address] [place] [address] [phone]  [facility] Primary Care: [address] [place] [address] [phone]  [facility]: [address] [place] [address] [phone]  Dr. Oneal Grout [address] [facility] [place] [address]  [phone]  Go to www.goodrx.com  or www.costplusdrugs.com to look up your medications. This will give you a list of where you can find your prescriptions at the most affordable prices. Or ask the pharmacist what the cash price is, or if they have any other discount programs available to help make your medication more affordable. This can be less expensive than what you would pay with  insurance.

## 2021-03-08 NOTE — ED Provider Notes (Addendum)
HPI  SUBJECTIVE:  Ernest Williams is an ambidextrous 50 y.o. male who presents with 1 year of constant, daily left wrist pain after having surgery on it in 5/21.  He has been evaluated by hand who has told him that the pain is because the bones have shifted and that it would require surgery to be fixed.  He states that he has been unable to afford the surgery due to insurance reasons.  States that the pain is getting worse.  Denies repeated trauma to the wrist.  Reports grip weakness and limitation of motion of the wrist secondary to the pain.  He denies erythema, bruising, fevers.  He states that it appears "puffy" but no significant swelling.  He has tried a wrist brace, Tylenol PM, ibuprofen 800 mg twice daily to 3 times daily, Aleve TENS unit.  TENS unit, Tylenol and ibuprofen helped very temporarily.  He has also tried lidocaine patches, multiple other NSAIDs.  Symptoms are worse with grabbing objects, supination, pronation.  He was on Neurontin for peripheral neuropathy, he is not sure if it helped his wrist while he was taking it.  He ran out of that 1 month ago.  He states that he has taken steroids in the past and they always give him "stomach problems" and does not want to try them again.  He has a past medical history of coronary artery disease, hypertension, peripheral neuropathy and is status post ORIF left wrist.  PMD: None.    Past Medical History:  Diagnosis Date  . Degeneration of spine   . Family history of adverse reaction to anesthesia    mother--- ponv  . History of concussion    09/ 2013  no loc,  no residual  . Hypertension    goes to urgent care , no regular pcp,  (stress echo 12-21-2009 in epic, normal)  . Peripheral neuropathy    per pt states told due to degenerative spine  . Wears glasses   . Wrist fracture, closed, left, initial encounter 03/25/2020    Past Surgical History:  Procedure Laterality Date  . NO PAST SURGERIES    . ORIF WRIST FRACTURE Left 03/31/2020    Procedure: OPEN REDUCTION INTERNAL FIXATION (ORIF) WRIST FRACTURE;  Surgeon: Sheral Apley, MD;  Location: [facility];  Service: Orthopedics;  Laterality: Left;    Family History  Problem Relation Age of Onset  . Diabetes Mother   . Healthy Father     Social History   Tobacco Use  . Smoking status: Former Smoker    Years: 10.00    Types: Cigarettes    Quit date: 2017    Years since quitting: 5.3  . Smokeless tobacco: Never Used  Vaping Use  . Vaping Use: Some days  . Substances: CBD  . Devices: vapeppresso  Substance Use Topics  . Alcohol use: Yes    Comment: occasional  . Drug use: Yes    Types: Marijuana    Comment: 03-30-2020 per pt smokes average 2-3g per week for neuropathy    No current facility-administered medications for this encounter.  Current Outpatient Medications:  .  Capsaicin 0.1 % CREA, Apply 1 application topically 4 (four) times daily as needed., Disp: 42.5 g, Rfl: 0 .  gabapentin (NEURONTIN) 300 MG capsule, 1 tab po at bedtime 1st day, 1 tablet bid second day, then 1 tablet tid, Disp: 30 capsule, Rfl: 0 .  meloxicam (MOBIC) 15 MG tablet, Take 1 tablet (15 mg total) by mouth daily  for 10 days., Disp: 10 tablet, Rfl: 0 .  amLODipine (NORVASC) 10 MG tablet, Take 1 tablet (10 mg total) by mouth daily., Disp: 90 tablet, Rfl: 3 .  Ascorbic Acid (VITAMIN C PO), Take by mouth daily., Disp: , Rfl:  .  aspirin EC 81 MG EC tablet, Take 1 tablet (81 mg total) by mouth daily. Swallow whole., Disp: 30 tablet, Rfl: 11 .  Cholecalciferol (VITAMIN D3 PO), Take 1 tablet by mouth daily., Disp: , Rfl:  .  Cyanocobalamin (VITAMIN B-12 PO), Take 1 tablet by mouth daily., Disp: , Rfl:  .  diphenhydrAMINE (BENADRYL) 25 MG tablet, Take 25 mg by mouth daily as needed., Disp: , Rfl:  .  L-ARGININE PO, Take 1 tablet by mouth daily., Disp: , Rfl:  .  lisinopril (ZESTRIL) 20 MG tablet, Take 1 tablet (20 mg total) by mouth daily. (Patient taking differently:  Take 20 mg by mouth at bedtime.), Disp: 30 tablet, Rfl: 2 .  nitroGLYCERIN (NITROSTAT) 0.4 MG SL tablet, Place 1 tablet (0.4 mg total) under the tongue every 5 (five) minutes x 3 doses as needed for chest pain., Disp: 25 tablet, Rfl: 0 .  rosuvastatin (CRESTOR) 10 MG tablet, Take 1 tablet (10 mg total) by mouth daily., Disp: 90 tablet, Rfl: 2  Allergies  Allergen Reactions  . Fish Allergy Anaphylaxis  . Tomato Anaphylaxis  . Pear     Upset stomach  . Strawberry (Diagnostic) Swelling    Swelling in mouth Upset stomach     ROS  As noted in HPI.   Physical Exam  BP (!) 162/109 (BP Location: Right Arm) Comment: Pt did not take BP medication today  Pulse 90   Temp 98.2 F (36.8 C) (Oral)   Resp 18   SpO2 98%   Constitutional: Well developed, well nourished, appears uncomfortable Eyes:  EOMI, conjunctiva normal bilaterally HENT: Normocephalic, atraumatic,mucus membranes moist Respiratory: Normal inspiratory effort Cardiovascular: Normal rate GI: nondistended skin: No rash, skin intact Musculoskeletal: L distal radius tender, distal ulnar styloid  tender, snuffbox tender, carpals NT, metacarpals NT, digits NT , TFCC  tender.  Pain with supination,  pain  with pronation, pain with radial / ulnar deviation. Motor intact ability to flex / extend digits of affected hand, Sensation LT to hand normal.  RP 2+.  Tenderness over the surgical scar over the volar aspect of the wrist.  Diffuse tenderness over the forearm.  CR<2 seconds distally. Elbow nontender  neurologic: Alert & oriented x 3, no focal neuro deficits Psychiatric: Speech and behavior appropriate   ED Course   Medications - No data to display  Orders Placed This Encounter  Procedures  . Ambulatory referral to Pain Clinic    Referral Priority:   Routine    Referral Type:   Consultation    Referral Reason:   Specialty Services Required    Requested Specialty:   Pain Medicine    Number of Visits Requested:   1    No  results found for this or any previous visit (from the past 24 hour(s)). No results found.  ED Clinical Impression  1. Chronic pain of left wrist   2. Pain of left forearm      ED Assessment/Plan  Patient with chronic pain.  Could be complex regional pain syndrome.  Has tried multiple modalities to treat this.   Will restart Neurontin, advised capsaicin cream, continue brace,  place referral to pain management for treatment until he is able to afford the surgery.  discontinue all other NSAIDs, try Mobic/Tylenol.  May take an additional 1000 g of Tylenol 3 more times a day.  Will provide primary care list for ongoing care and order assistance in finding a PMD.  Discussed  MDM, treatment plan, and plan for follow-up with patient. patient agrees with plan.   Meds ordered this encounter  Medications  . gabapentin (NEURONTIN) 300 MG capsule    Sig: 1 tab po at bedtime 1st day, 1 tablet bid second day, then 1 tablet tid    Dispense:  30 capsule    Refill:  0  . meloxicam (MOBIC) 15 MG tablet    Sig: Take 1 tablet (15 mg total) by mouth daily for 10 days.    Dispense:  10 tablet    Refill:  0  . Capsaicin 0.1 % CREA    Sig: Apply 1 application topically 4 (four) times daily as needed.    Dispense:  42.5 g    Refill:  0      *This clinic note was created using Scientist, clinical (histocompatibility and immunogenetics). Therefore, there may be occasional mistakes despite careful proofreading.  ?    Domenick Gong, MD 03/09/21 3614    Domenick Gong, MD 03/09/21 [phone]

## 2021-03-08 NOTE — ED Triage Notes (Signed)
Pt c/o left arm after having surgery last year. He states for the past month he has been in agonizing pain.

## 2021-03-15 ENCOUNTER — Telehealth (HOSPITAL_BASED_OUTPATIENT_CLINIC_OR_DEPARTMENT_OTHER): Payer: Self-pay

## 2021-03-15 NOTE — Telephone Encounter (Signed)
-----   Message from Aaron Edelman, RN sent at 03/15/2021 11:57 AM EDT ----- Regarding: UC to PCP Patient needs to establish with PCP - routine

## 2021-04-02 ENCOUNTER — Encounter: Payer: Self-pay | Admitting: Physical Medicine & Rehabilitation

## 2021-04-05 ENCOUNTER — Ambulatory Visit (HOSPITAL_BASED_OUTPATIENT_CLINIC_OR_DEPARTMENT_OTHER): Payer: BC Managed Care – PPO | Admitting: Family Medicine

## 2021-04-24 ENCOUNTER — Ambulatory Visit (INDEPENDENT_AMBULATORY_CARE_PROVIDER_SITE_OTHER): Payer: BC Managed Care – PPO | Admitting: Family Medicine

## 2021-04-24 ENCOUNTER — Encounter (HOSPITAL_BASED_OUTPATIENT_CLINIC_OR_DEPARTMENT_OTHER): Payer: Self-pay | Admitting: Family Medicine

## 2021-04-24 ENCOUNTER — Other Ambulatory Visit: Payer: Self-pay

## 2021-04-24 VITALS — BP 142/100 | HR 86 | Ht 74.0 in | Wt 266.6 lb

## 2021-04-24 DIAGNOSIS — E785 Hyperlipidemia, unspecified: Secondary | ICD-10-CM

## 2021-04-24 DIAGNOSIS — I10 Essential (primary) hypertension: Secondary | ICD-10-CM | POA: Diagnosis not present

## 2021-04-24 DIAGNOSIS — R7303 Prediabetes: Secondary | ICD-10-CM | POA: Diagnosis not present

## 2021-04-24 MED ORDER — GABAPENTIN 300 MG PO CAPS: 300.0000 mg | ORAL_CAPSULE | Freq: Every day | ORAL | 0 refills | Status: DC

## 2021-04-24 MED ORDER — CYCLOBENZAPRINE HCL 10 MG PO TABS: 10.0000 mg | ORAL_TABLET | Freq: Every day | ORAL | 0 refills | Status: DC | PRN

## 2021-04-24 NOTE — Assessment & Plan Note (Signed)
Tolerating statin well, continue with rosuvastatin at present dose Lipid panel to be repeated with cardiology in about 3 months

## 2021-04-24 NOTE — Assessment & Plan Note (Signed)
Blood pressure slightly elevated in office today, reportedly better controlled with home readings Recommend continuing with lisinopril and amlodipine at present dosing  Continue to monitor blood pressure at home and would recommend bringing home cuff to cardiology visit and next appointment with me in order to verify accuracy Recommend lifestyle modifications including DASH diet, regular physical activity

## 2021-04-24 NOTE — Assessment & Plan Note (Signed)
Discussed that elevated A1c as patient has correlates with increased risk of developing diabetes in the future Thus measures are recommended to lower risk of developing diabetes in the future including lifestyle modifications, possible initiation of metformin Discussed referral to nutritionist to assist with dietary changes, lifestyle modifications, patient declines at this time Patient also declines initiation of metformin Recommend gradual healthy weight loss, will monitor weight at future visits Plan to recheck hemoglobin A1c in about 3 months at the same time that lipid panel is repeated

## 2021-04-24 NOTE — Progress Notes (Signed)
New Patient Office Visit  Subjective:  Patient ID: Ernest Williams, male    DOB: 05-Nov-1971  Age: 50 y.o. MRN: [id number]  CC:  Chief Complaint  Patient presents with   Establish Care    No prior PCP   Pain    Patient seen in urgent care and ED multiple times for neuropathy and chronic pain from a "botched surgery". He has been referred to pain management and states he intends to see them at the end of June   Hypertension    Patient has known hypertension. He is prescribed amlodipine and lisinopril. Patient states he had a mild heart attack in March and is currently being treated by [facility].   Medication Refill    Patient presents for medication refill on gabapentin and flexeril. Patient states he took his last pills yesterday.    HPI Ernest Williams is a 50 year old male presenting to establish in clinic.  Only concerns today are regarding wanting refills for gabapentin and cyclobenzaprine.  Past medical history significant for hyperlipidemia, hypertension, CAD, prediabetes.  Patient reports chronic pain related to left wrist injury/surgery.  Prediabetes: Found to have elevated A1c at 6.1% about 4 months ago.  This was found around evaluation of chest pain.  Reports that he has been trying to make some lifestyle modifications since being told of these findings.  Hypertension, hyperlipidemia, CAD: Follows with cardiology.  Was seen in the hospital for chest pain about 4 months ago.  Most recent follow-up with cardiology was in March.  Presently taking rosuvastatin for hyperlipidemia.  Also taking amlodipine and lisinopril related to hypertension.  Testing revealed that CAD is nonobstructive with coronary calcium score of 26.  He denies any issues with body aches or myalgias.  Denies any current chest pain, headache, vision changes.  Reports he does check his blood pressure at home and systolic ranges from 135-140 and diastolic ranges from 85-90.  Chronic pain of left wrist: Reports he had  surgery for a fracture of his left wrist about 1 year ago.  He has had persistent pain and reported neuropathy since that time.  Has intermittently taken gabapentin and cyclobenzaprine to help with symptoms.  Denies any side effects with taking medications at this drowsiness.  Pain is led to presentation emergency room/urgent care over the last year.  Reports that he will be seeing pain management in about 2 weeks for further evaluation management.  Past Medical History:  Diagnosis Date   Degeneration of spine    Family history of adverse reaction to anesthesia    mother--- ponv   History of concussion    09/ 2013  no loc,  no residual   Hypertension    goes to urgent care , no regular pcp,  (stress echo 12-21-2009 in epic, normal)   Peripheral neuropathy    per pt states told due to degenerative spine   Wears glasses    Wrist fracture, closed, left, initial encounter 03/25/2020    Past Surgical History:  Procedure Laterality Date   NO PAST SURGERIES     ORIF WRIST FRACTURE Left 03/31/2020   Procedure: OPEN REDUCTION INTERNAL FIXATION (ORIF) WRIST FRACTURE;  Surgeon: Sheral Apley, MD;  Location: [facility];  Service: Orthopedics;  Laterality: Left;    Family History  Problem Relation Age of Onset   Diabetes Mother    Healthy Father     Social History   Socioeconomic History   Marital status: Single    Spouse name: Not on file  Number of children: Not on file   Years of education: Not on file   Highest education level: Not on file  Occupational History   Not on file  Tobacco Use   Smoking status: Former    Years: 10.00    Pack years: 0.00    Types: Cigarettes    Quit date: 2017    Years since quitting: 5.4   Smokeless tobacco: Never  Vaping Use   Vaping Use: Some days   Substances: CBD   Devices: vapeppresso  Substance and Sexual Activity   Alcohol use: Yes    Comment: occasional   Drug use: Yes    Types: Marijuana    Comment: 03-30-2020  per pt smokes average 2-3g per week for neuropathy   Sexual activity: Not on file  Other Topics Concern   Not on file  Social History Narrative   Not on file   Social Determinants of Health   Financial Resource Strain: Not on file  Food Insecurity: Not on file  Transportation Needs: Not on file  Physical Activity: Not on file  Stress: Not on file  Social Connections: Not on file  Intimate Partner Violence: Not on file    Objective:   Today's Vitals: BP (!) 142/100   Pulse 86   Ht 6\' 2"  (1.88 m)   Wt 266 lb 9.6 oz (120.9 kg)   SpO2 97%   BMI 34.23 kg/m   Physical Exam  50 year old male in no acute distress Cardiovascular exam with regular rate and rhythm, no murmurs appreciated Lungs clear to auscultation bilaterally  Assessment & Plan:   Problem List Items Addressed This Visit       Cardiovascular and Mediastinum   HTN (hypertension)    Blood pressure slightly elevated in office today, reportedly better controlled with home readings Recommend continuing with lisinopril and amlodipine at present dosing  Continue to monitor blood pressure at home and would recommend bringing home cuff to cardiology visit and next appointment with me in order to verify accuracy Recommend lifestyle modifications including DASH diet, regular physical activity         Other   HLD (hyperlipidemia)    Tolerating statin well, continue with rosuvastatin at present dose Lipid panel to be repeated with cardiology in about 3 months       Prediabetes - Primary    Discussed that elevated A1c as patient has correlates with increased risk of developing diabetes in the future Thus measures are recommended to lower risk of developing diabetes in the future including lifestyle modifications, possible initiation of metformin Discussed referral to nutritionist to assist with dietary changes, lifestyle modifications, patient declines at this time Patient also declines initiation of  metformin Recommend gradual healthy weight loss, will monitor weight at future visits Plan to recheck hemoglobin A1c in about 3 months at the same time that lipid panel is repeated       Relevant Orders   HgB A1c  Will refill gabapentin and cyclobenzaprine for management of chronic pain symptoms.  Only provided prescription for 1 month and no refills as patient will be establishing with pain management and further management decisions including pharmacotherapy, interventions will be decided upon by pain management  Outpatient Encounter Medications as of 04/24/2021  Medication Sig   amLODipine (NORVASC) 10 MG tablet Take 1 tablet (10 mg total) by mouth daily.   Ascorbic Acid (VITAMIN C PO) Take by mouth daily.   aspirin EC 81 MG EC tablet Take 1 tablet (81 mg total)  by mouth daily. Swallow whole.   Capsaicin 0.1 % CREA Apply 1 application topically 4 (four) times daily as needed.   Cholecalciferol (VITAMIN D3 PO) Take 1 tablet by mouth daily.   Cyanocobalamin (VITAMIN B-12 PO) Take 1 tablet by mouth daily.   cyclobenzaprine (FLEXERIL) 10 MG tablet Take 1 tablet (10 mg total) by mouth daily as needed for muscle spasms.   diphenhydrAMINE (BENADRYL) 25 MG tablet Take 25 mg by mouth daily as needed.   L-ARGININE PO Take 1 tablet by mouth daily.   lisinopril (ZESTRIL) 20 MG tablet Take 1 tablet (20 mg total) by mouth daily. (Patient taking differently: Take 20 mg by mouth at bedtime.)   nitroGLYCERIN (NITROSTAT) 0.4 MG SL tablet Place 1 tablet (0.4 mg total) under the tongue every 5 (five) minutes x 3 doses as needed for chest pain.   rosuvastatin (CRESTOR) 10 MG tablet Take 1 tablet (10 mg total) by mouth daily.   [DISCONTINUED] gabapentin (NEURONTIN) 300 MG capsule 1 tab po at bedtime 1st day, 1 tablet bid second day, then 1 tablet tid   gabapentin (NEURONTIN) 300 MG capsule Take 1 capsule (300 mg total) by mouth daily. 1 tab po at bedtime 1st day, 1 tablet bid second day, then 1 tablet tid    No facility-administered encounter medications on file as of 04/24/2021.   Spent 45 minutes on this patient encounter, including preparation, chart review, face-to-face counseling with patient and coordination of care, and documentation of encounter  Follow-up: Return in about 4 months (around 08/24/2021).  Follow-up on hyperlipidemia, prediabetes, hypertension.  Should have A1c result back prior to next appointment   J De Peru, MD

## 2021-04-24 NOTE — Patient Instructions (Addendum)
  Medication Instructions:  Your physician recommends that you continue on your current medications as directed. Please refer to the Current Medication list given to you today. --If you need a refill on any your medications before your next appointment, please call your pharmacy first. If no refills are authorized on file call the office.-- Lab Work: Your physician has recommended that you have lab work today: A1C in September with other labs. If you have labs (blood work) drawn today and your tests are completely normal, you will receive your results via MyChart message OR a phone call from our staff.  Please ensure you check your voicemail in the event that you authorized detailed messages to be left on a delegated number. If you have any lab test that is abnormal or we need to change your treatment, we will call you to review the results.  Follow-Up: Your next appointment:   Your physician recommends that you schedule a follow-up appointment in: 3-4 MONTHS with Dr. de Peru  Thanks for letting us be apart of your health journey!!  Primary Care and Sports Medicine   Dr. Ceasar Mons Peru   We encourage you to activate your patient portal called "MyChart".  Sign up information is provided on this After Visit Summary.  MyChart is used to connect with patients for Virtual Visits (Telemedicine).  Patients are able to view lab/test results, encounter notes, upcoming appointments, etc.  Non-urgent messages can be sent to your provider as well. To learn more about what you can do with MyChart, please visit --  [email or web address].

## 2021-05-11 ENCOUNTER — Encounter: Payer: Self-pay | Admitting: Physical Medicine & Rehabilitation

## 2021-05-11 ENCOUNTER — Encounter
Payer: BC Managed Care – PPO | Attending: Physical Medicine & Rehabilitation | Admitting: Physical Medicine & Rehabilitation

## 2021-05-11 ENCOUNTER — Other Ambulatory Visit: Payer: Self-pay

## 2021-05-11 VITALS — BP 125/91 | HR 103 | Temp 99.2°F | Ht 74.0 in | Wt 260.2 lb

## 2021-05-11 DIAGNOSIS — G5642 Causalgia of left upper limb: Secondary | ICD-10-CM | POA: Insufficient documentation

## 2021-05-11 MED ORDER — GABAPENTIN 400 MG PO CAPS: 400.0000 mg | ORAL_CAPSULE | Freq: Three times a day (TID) | ORAL | 0 refills | Status: DC

## 2021-05-11 NOTE — Patient Instructions (Signed)
Try desensitizing the tender area

## 2021-05-11 NOTE — Progress Notes (Signed)
Subjective:    Patient ID: Ernest Williams, male    DOB: 01/04/1971, 50 y.o.   MRN: [id number]  HPI  50 year old male referred by orthopedic hand surgeon for the evaluation of left wrist pain.  The patient fell at home in his attic and suffered left radius fracture.  He underwent open reduction internal fixation approximately 7 days later. Left Wrist pain since ORIF 03/31/20.  The patient then sought second opinion with orthopedic hand surgeon after continued pain.  Patient was diagnosed with malunion of the radial fracture and arthroscopic debridement and surgery for ulnar shortening was discussed.  Despite his pain the patient is able to work full-time he still does some hobbies as well.  His pain is rated as a 8-9 out of 10 with burning.  It is fairly constant but gets exacerbated with touching that area on the ulnar side of his wrist. IMPRESSION: 1. Comminuted, dorsally impacted intra-articular fracture of the distal radius with dorsal angulation of the distal radial articular surface of 26 degrees. Associated mildly displaced ulnar styloid fracture. No dislocation.     Electronically Signed   By: Amie Portland M.D.   On: 03/25/2020 16:24  Started after Ulnar   Heart disease MI in March   Pain Inventory Average Pain 8 Pain Right Now 8 My pain is burning  In the last 24 hours, has pain interfered with the following? General activity 0 Relation with others 0 Enjoyment of life 9 What TIME of day is your pain at its worst? morning , daytime, evening, and night Sleep (in general) Poor  Pain is worse with: unsure Pain improves with:  other Relief from Meds: 2  walk without assistance ability to climb steps?  yes do you drive?  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  no  Dairl Ponder MD referring provider    Family History  Problem Relation Age of Onset   Diabetes Mother     Healthy Father    Social History   Socioeconomic History   Marital status: Single    Spouse name: Not on file   Number of children: Not on file   Years of education: Not on file   Highest education level: Not on file  Occupational History   Not on file  Tobacco Use   Smoking status: Former    Years: 10.00    Pack years: 0.00    Types: Cigarettes    Quit date: 2017    Years since quitting: 5.4   Smokeless tobacco: Never  Vaping Use   Vaping Use: Some days   Substances: CBD   Devices: vapeppresso  Substance and Sexual Activity   Alcohol use: Yes    Comment: occasional   Drug use: Yes    Types: Marijuana    Comment: 03-30-2020 per pt smokes average 2-3g per week for neuropathy   Sexual activity: Not on file  Other Topics Concern   Not on file  Social History Narrative   Not on file   Social Determinants of Health   Financial Resource Strain: Not on file  Food Insecurity: Not on file  Transportation Needs: Not on file  Physical Activity: Not on file  Stress: Not on file  Social Connections: Not on file   Past Surgical History:  Procedure Laterality Date   NO PAST SURGERIES     ORIF WRIST FRACTURE Left 03/31/2020   Procedure: OPEN REDUCTION  INTERNAL FIXATION (ORIF) WRIST FRACTURE;  Surgeon: Sheral Apley, MD;  Location: [facility];  Service: Orthopedics;  Laterality: Left;   Past Medical History:  Diagnosis Date   Degeneration of spine    Family history of adverse reaction to anesthesia    mother--- ponv   History of concussion    09/ 2013  no loc,  no residual   Hypertension    goes to urgent care , no regular pcp,  (stress echo 12-21-2009 in epic, normal)   Peripheral neuropathy    per pt states told due to degenerative spine   Wears glasses    Wrist fracture, closed, left, initial encounter 03/25/2020   There were no vitals taken for this visit.  Opioid Risk Score:   Fall Risk Score:  `1  Depression screen PHQ 2/9  Depression  screen [facility] 2/9 05/11/2021 04/24/2021 04/24/2021  Decreased Interest 0 0 0  Down, Depressed, Hopeless 0 0 0  PHQ - 2 Score 0 0 0  Altered sleeping 0 - 1  Tired, decreased energy 0 - 1  Change in appetite 0 - 0  Feeling bad or failure about yourself  0 - 0  Trouble concentrating 0 - 0  Moving slowly or fidgety/restless 0 - 0  Suicidal thoughts 0 - 0  PHQ-9 Score 0 - 2  Difficult doing work/chores - - Not difficult at all     Review of Systems  Constitutional:  Positive for unexpected weight change.       Gain  HENT: Negative.    Eyes: Negative.   Respiratory: Negative.    Cardiovascular: Negative.   Gastrointestinal: Negative.   Musculoskeletal:        Spasms/ left forearm and wrist  Skin: Negative.   Allergic/Immunologic: Negative.   Neurological:        Burning and tingling  Hematological: Negative.   Psychiatric/Behavioral:  The patient is nervous/anxious.   All other systems reviewed and are negative.     Objective:   Physical Exam Vitals and nursing note reviewed.  Constitutional:      Appearance: He is obese.  HENT:     Head: Normocephalic and atraumatic.  Eyes:     Extraocular Movements: Extraocular movements intact.     Conjunctiva/sclera: Conjunctivae normal.     Pupils: Pupils are equal, round, and reactive to light.  Cardiovascular:     Rate and Rhythm: Normal rate and regular rhythm.     Heart sounds: Normal heart sounds.  Pulmonary:     Effort: Pulmonary effort is normal. No respiratory distress.     Breath sounds: Normal breath sounds.  Abdominal:     General: Abdomen is flat. Bowel sounds are normal. There is no distension.     Palpations: Abdomen is soft.  Musculoskeletal:        General: No tenderness.     Comments: Left wrist has hypersensitivity to touch over the ulnar aspect. There is also hypersensitivity to touch over the incision from ORIF.  Reduced wrist range of motion for approximately 30 degrees extension and flexion. Finger motion finger  to thumb opposition is intact.  He is able to make a fist.  Elbow range of motion is full shoulder range of motion is full.  Negative Tinel's over the elbow. There is no evidence of atrophy in the hand. Skin color is good there is no evidence of swelling.  No dystrophic changes of the nails.  There is mild tingling of the left little finger but  no other sensory abnormalities in the hand.  Skin:    General: Skin is warm and dry.  Neurological:     Mental Status: He is alert and oriented to person, place, and time.  Psychiatric:        Mood and Affect: Mood normal.        Behavior: Behavior normal.          Assessment & Plan:  1.  Chronic postoperative pain likely some small fiber damage associated with his surgery.  No evidence of motor dysfunction. 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 He has not had an adequate trial of gabapentin has only been taking 300 mg nightly will increase it to 3 times daily and then when he finishes work up to 400 3 times daily.  Will See patient back in 1 month.  Patient is not very enthusiastic about interventional procedures such as stellate ganglion block and certainly given that this is type II CRPS perhaps not as helpful.  We also discussed desensitization such as using a brush to desensitize the skin over the medial wrist.  He can work his way up to stiffer brushes.

## 2021-06-15 ENCOUNTER — Encounter: Payer: BC Managed Care – PPO | Admitting: Physical Medicine & Rehabilitation

## 2021-06-21 ENCOUNTER — Encounter: Payer: Self-pay | Admitting: Physical Medicine & Rehabilitation

## 2021-06-21 ENCOUNTER — Encounter
Payer: BC Managed Care – PPO | Attending: Physical Medicine & Rehabilitation | Admitting: Physical Medicine & Rehabilitation

## 2021-06-21 ENCOUNTER — Other Ambulatory Visit: Payer: Self-pay

## 2021-06-21 VITALS — BP 134/91 | HR 87 | Temp 98.6°F | Ht 74.0 in | Wt 271.0 lb

## 2021-06-21 DIAGNOSIS — G5642 Causalgia of left upper limb: Secondary | ICD-10-CM | POA: Insufficient documentation

## 2021-06-21 MED ORDER — GABAPENTIN 600 MG PO TABS: 600.0000 mg | ORAL_TABLET | Freq: Three times a day (TID) | ORAL | 0 refills | Status: DC

## 2021-06-21 MED ORDER — CYCLOBENZAPRINE HCL 10 MG PO TABS: 10.0000 mg | ORAL_TABLET | Freq: Every day | ORAL | 0 refills | Status: DC | PRN

## 2021-06-21 NOTE — Progress Notes (Signed)
Subjective:    Patient ID: Ernest Williams, male    DOB: [birth year], 50 y.o.   MRN: [id number]  HPI 50 yo male with hx of CRPS 2 LUE ulnar distribution who returns for neuropathic pain management,  Gabapentin 400mg  TID did not appear to be more effective than 300mg  , no sedation or other SE Pain score last visit 8/10  Pain Inventory Average Pain 7 Pain Right Now 6 My pain is constant, burning, and aching  In the last 24 hours, has pain interfered with the following? General activity 7 Relation with others 7 Enjoyment of life 6 What TIME of day is your pain at its worst? morning , daytime, evening, and night Sleep (in general) Poor  Pain is worse with: some activites Pain improves with: medication and emg Relief from Meds: 5  Family History  Problem Relation Age of Onset   Diabetes Mother    Healthy Father    Social History   Socioeconomic History   Marital status: Single    Spouse name: Not on file   Number of children: Not on file   Years of education: Not on file   Highest education level: Not on file  Occupational History   Not on file  Tobacco Use   Smoking status: Former    Years: 10.00    Types: Cigarettes    Quit date: 2017    Years since quitting: 5.6   Smokeless tobacco: Never  Vaping Use   Vaping Use: Former   Devices: vapeppresso  Substance and Sexual Activity   Alcohol use: Not Currently    Comment: occasional   Drug use: Yes    Types: Marijuana    Comment: 03-30-2020 per pt smokes average 2-3g per week for neuropathy   Sexual activity: Not on file  Other Topics Concern   Not on file  Social History Narrative   Not on file   Social Determinants of Health   Financial Resource Strain: Not on file  Food Insecurity: Not on file  Transportation Needs: Not on file  Physical Activity: Not on file  Stress: Not on file  Social Connections: Not on file   Past Surgical History:  Procedure Laterality Date   NO PAST SURGERIES     ORIF WRIST  FRACTURE Left 03/31/2020   Procedure: OPEN REDUCTION INTERNAL FIXATION (ORIF) WRIST FRACTURE;  Surgeon: 04-01-2020, MD;  Location: [facility];  Service: Orthopedics;  Laterality: Left;   Past Surgical History:  Procedure Laterality Date   NO PAST SURGERIES     ORIF WRIST FRACTURE Left 03/31/2020   Procedure: OPEN REDUCTION INTERNAL FIXATION (ORIF) WRIST FRACTURE;  Surgeon: [facility], MD;  Location: [facility];  Service: Orthopedics;  Laterality: Left;   Past Medical History:  Diagnosis Date   Degeneration of spine    Family history of adverse reaction to anesthesia    mother--- ponv   History of concussion    09/ 2013  no loc,  no residual   Hypertension    goes to urgent care , no regular pcp,  (stress echo 12-21-2009 in epic, normal)   Peripheral neuropathy    per pt states told due to degenerative spine   Wears glasses    Wrist fracture, closed, left, initial encounter 03/25/2020   BP (!) 134/91   Pulse 87   Temp 98.6 F (37 C)   Ht 6\' 2"  (1.88 m)   Wt 271 lb (122.9 kg)   SpO2 96%  BMI 34.79 kg/m   Opioid Risk Score:   Fall Risk Score:  `1  Depression screen PHQ 2/9  Depression screen [facility] 2/9 05/11/2021 04/24/2021 04/24/2021  Decreased Interest 0 0 0  Down, Depressed, Hopeless 0 0 0  PHQ - 2 Score 0 0 0  Altered sleeping 0 - 1  Tired, decreased energy 0 - 1  Change in appetite 0 - 0  Feeling bad or failure about yourself  0 - 0  Trouble concentrating 0 - 0  Moving slowly or fidgety/restless 0 - 0  Suicidal thoughts 0 - 0  PHQ-9 Score 0 - 2  Difficult doing work/chores - - Not difficult at all    Review of Systems  Musculoskeletal:  Positive for back pain.       Pain in left wrist, left calf spasms  All other systems reviewed and are negative.     Objective:   Physical Exam Vitals and nursing note reviewed.  Constitutional:      Appearance: He is obese.  HENT:     Head: Normocephalic and atraumatic.  Eyes:      Extraocular Movements: Extraocular movements intact.     Pupils: Pupils are equal, round, and reactive to light.  Musculoskeletal:     Comments: No joint swelling in the Left wrist or fingers  Neg vasomotor changes   Skin:    General: Skin is warm and dry.     Findings: No erythema or lesion.  Neurological:     Mental Status: He is alert and oriented to person, place, and time.     Comments: Hypersensitive to touch LUE 5th digit          Assessment & Plan:    CRPS 2 LUE ulnar distribution, will increase gabapentin to 600mg  TID RTC 1 mo with NP, if no better and no side effects may increase dose to 800mg  TID

## 2021-07-19 ENCOUNTER — Other Ambulatory Visit: Payer: Self-pay

## 2021-07-19 ENCOUNTER — Encounter: Payer: BC Managed Care – PPO | Attending: Registered Nurse | Admitting: Registered Nurse

## 2021-07-19 ENCOUNTER — Encounter: Payer: Self-pay | Admitting: Registered Nurse

## 2021-07-19 VITALS — BP 145/90 | HR 84 | Temp 98.9°F | Ht 74.0 in | Wt 277.0 lb

## 2021-07-19 DIAGNOSIS — M542 Cervicalgia: Secondary | ICD-10-CM | POA: Insufficient documentation

## 2021-07-19 DIAGNOSIS — G5642 Causalgia of left upper limb: Secondary | ICD-10-CM | POA: Insufficient documentation

## 2021-07-19 DIAGNOSIS — M5416 Radiculopathy, lumbar region: Secondary | ICD-10-CM | POA: Diagnosis not present

## 2021-07-19 DIAGNOSIS — M5412 Radiculopathy, cervical region: Secondary | ICD-10-CM | POA: Diagnosis not present

## 2021-07-19 DIAGNOSIS — M62838 Other muscle spasm: Secondary | ICD-10-CM | POA: Insufficient documentation

## 2021-07-19 MED ORDER — CYCLOBENZAPRINE HCL 10 MG PO TABS: 10.0000 mg | ORAL_TABLET | Freq: Every day | ORAL | 3 refills | Status: DC | PRN

## 2021-07-19 MED ORDER — GABAPENTIN 800 MG PO TABS: 800.0000 mg | ORAL_TABLET | Freq: Three times a day (TID) | ORAL | 3 refills | Status: DC

## 2021-07-19 NOTE — Progress Notes (Signed)
Subjective:    Patient ID: Ernest Williams, male    DOB: [birth year], 50 y.o.   MRN: [id number]  HPI: Ernest Williams is a 50 y.o. male who returns for follow up appointment for chronic pain and medication refill. He states his  pain is located in his neck radiating into his left shoulder and left arm with tingling and burning, also reports lower back pain radiating into his left lower extremity. He rates his pain 7. His current exercise regime is walking and performing stretching exercises.     Pain Inventory Average Pain 7 Pain Right Now 7 My pain is burning and aching  In the last 24 hours, has pain interfered with the following? General activity 8 Relation with others 7 Enjoyment of life 7 What TIME of day is your pain at its worst? morning  and evening Sleep (in general) Poor  Pain is worse with: some activites Pain improves with: medication Relief from Meds: 4  Family History  Problem Relation Age of Onset   Diabetes Mother    Healthy Father    Social History   Socioeconomic History   Marital status: Single    Spouse name: Not on file   Number of children: Not on file   Years of education: Not on file   Highest education level: Not on file  Occupational History   Not on file  Tobacco Use   Smoking status: Former    Years: 10.00    Types: Cigarettes    Quit date: 2017    Years since quitting: 5.6   Smokeless tobacco: Never  Vaping Use   Vaping Use: Former   Devices: vapeppresso  Substance and Sexual Activity   Alcohol use: Not Currently    Comment: occasional   Drug use: Yes    Types: Marijuana    Comment: 03-30-2020 per pt smokes average 2-3g per week for neuropathy   Sexual activity: Not on file  Other Topics Concern   Not on file  Social History Narrative   Not on file   Social Determinants of Health   Financial Resource Strain: Not on file  Food Insecurity: Not on file  Transportation Needs: Not on file  Physical Activity: Not on file  Stress:  Not on file  Social Connections: Not on file   Past Surgical History:  Procedure Laterality Date   NO PAST SURGERIES     ORIF WRIST FRACTURE Left 03/31/2020   Procedure: OPEN REDUCTION INTERNAL FIXATION (ORIF) WRIST FRACTURE;  Surgeon: Sheral Apley, MD;  Location: [facility];  Service: Orthopedics;  Laterality: Left;   Past Surgical History:  Procedure Laterality Date   NO PAST SURGERIES     ORIF WRIST FRACTURE Left 03/31/2020   Procedure: OPEN REDUCTION INTERNAL FIXATION (ORIF) WRIST FRACTURE;  Surgeon: Sheral Apley, MD;  Location: [facility];  Service: Orthopedics;  Laterality: Left;   Past Medical History:  Diagnosis Date   Degeneration of spine    Family history of adverse reaction to anesthesia    mother--- ponv   History of concussion    09/ 2013  no loc,  no residual   Hypertension    goes to urgent care , no regular pcp,  (stress echo 12-21-2009 in epic, normal)   Peripheral neuropathy    per pt states told due to degenerative spine   Wears glasses    Wrist fracture, closed, left, initial encounter 03/25/2020   BP (!) 145/90   Pulse 84  Temp 98.9 F (37.2 C)   Ht 6\' 2"  (1.88 m)   Wt 277 lb (125.6 kg)   SpO2 97%   BMI 35.56 kg/m   Opioid Risk Score:   Fall Risk Score:  `1  Depression screen PHQ 2/9  Depression screen [facility] 2/9 06/21/2021 05/11/2021 04/24/2021 04/24/2021  Decreased Interest 0 0 0 0  Down, Depressed, Hopeless 0 0 0 0  PHQ - 2 Score 0 0 0 0  Altered sleeping - 0 - 1  Tired, decreased energy - 0 - 1  Change in appetite - 0 - 0  Feeling bad or failure about yourself  - 0 - 0  Trouble concentrating - 0 - 0  Moving slowly or fidgety/restless - 0 - 0  Suicidal thoughts - 0 - 0  PHQ-9 Score - 0 - 2  Difficult doing work/chores - - - Not difficult at all    Review of Systems  Constitutional: Negative.   HENT: Negative.    Eyes: Negative.   Respiratory: Negative.    Cardiovascular: Negative.    Gastrointestinal: Negative.   Endocrine: Negative.   Genitourinary: Negative.   Musculoskeletal:  Positive for arthralgias and back pain.  Skin: Negative.   Allergic/Immunologic: Negative.   Neurological: Negative.   Hematological: Negative.   Psychiatric/Behavioral: Negative.    All other systems reviewed and are negative.     Objective:   Physical Exam Vitals and nursing note reviewed.  Constitutional:      Appearance: Normal appearance.  Cardiovascular:     Rate and Rhythm: Normal rate and regular rhythm.     Pulses: Normal pulses.     Heart sounds: Normal heart sounds.  Pulmonary:     Effort: Pulmonary effort is normal.     Breath sounds: Normal breath sounds.  Musculoskeletal:     Cervical back: Normal range of motion and neck supple.     Comments: Normal Muscle Bulk and Muscle Testing Reveals:  Upper Extremities: Right: Full ROM and Muscle Strength 5/5 Left Lower Extremity: Decreased ROM 45 Degrees and Muscle Strength 3/5 Lumbar Hypersensitivity Lower Extremities: Right: Full ROM and Muscle Strength 5/5 Left Lower Extremity: Decreased ROM and Muscle Strength 4/5 Arises from Table Slowly Narrow Based  Gait     Skin:    General: Skin is warm and dry.  Neurological:     Mental Status: He is alert and oriented to person, place, and time.  Psychiatric:        Mood and Affect: Mood normal.        Behavior: Behavior normal.         Assessment & Plan:  Complex Regional Pain Syndrome Type 2 : Increased : Gabapentin 800 mg TID. He was instructed to call office in two weeks to evaluate medication change.  Cervicalgia/ Cervical Radiculitis: Continue current medication regimen. Continue to Monitor. Continue HEP as Tolerated.  Left Lumbar Radiculitis: Continue Gabapentin. Continue to Monitor.   F/U in 3 months.

## 2021-07-19 NOTE — Patient Instructions (Signed)
Call office or send a My-Chart message in two weeks to evaluate change in Gabapentin dose  [phone]

## 2021-07-20 ENCOUNTER — Ambulatory Visit (INDEPENDENT_AMBULATORY_CARE_PROVIDER_SITE_OTHER): Payer: BC Managed Care – PPO | Admitting: Cardiology

## 2021-07-20 ENCOUNTER — Other Ambulatory Visit: Payer: Self-pay

## 2021-07-20 VITALS — BP 142/96 | Ht 74.0 in | Wt 275.4 lb

## 2021-07-20 DIAGNOSIS — I10 Essential (primary) hypertension: Secondary | ICD-10-CM | POA: Diagnosis not present

## 2021-07-20 DIAGNOSIS — I2510 Atherosclerotic heart disease of native coronary artery without angina pectoris: Secondary | ICD-10-CM

## 2021-07-20 DIAGNOSIS — M79605 Pain in left leg: Secondary | ICD-10-CM

## 2021-07-20 DIAGNOSIS — Z01818 Encounter for other preprocedural examination: Secondary | ICD-10-CM

## 2021-07-20 DIAGNOSIS — E785 Hyperlipidemia, unspecified: Secondary | ICD-10-CM | POA: Diagnosis not present

## 2021-07-20 MED ORDER — AMLODIPINE BESYLATE 10 MG PO TABS: 10.0000 mg | ORAL_TABLET | Freq: Every day | ORAL | 3 refills | Status: DC

## 2021-07-20 MED ORDER — LISINOPRIL 20 MG PO TABS: 20.0000 mg | ORAL_TABLET | Freq: Every day | ORAL | 2 refills | Status: DC

## 2021-07-20 MED ORDER — ROSUVASTATIN CALCIUM 10 MG PO TABS: 10.0000 mg | ORAL_TABLET | Freq: Every day | ORAL | 2 refills | Status: DC

## 2021-07-20 NOTE — Progress Notes (Signed)
Cardiology Office Note:    Date:  11/19/2021   ID:  Ernest Williams, DOB 1971-04-17, MRN [id number]  PCP:  [place], Raymond J, MD  Cardiologist:  None  Electrophysiologist:  None   Referring MD: No ref. provider found   Chief Complaint  Patient presents with   Coronary Artery Disease     History of Present Illness:    Ernest Williams is a 50 y.o. male with a hx of hypertension, peripheral neuropathy of unknown etiology, significant family history of early CAD who presents for hospital follow-up.  He was admitted from 12/17/2020 through 12/18/2020 to [facility] with chest pain.  EKG unremarkable, negative troponins.  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  Echocardiogram showed normal biventricular function, no significant valvular disease.  Since last clinic visit, he reports that he has been off his statin and blood pressure medicines for the last month.  Reports chest pain has improved, occurring rarely.  Denies any dyspnea, lightheadedness, syncope, lower extremity edema, or palpitations.  Reports he has been riding stationary bike for 30 minutes 3 times per week, denies any exertional chest pain.  Does report has been having pain in the back of his left leg.   Past Medical History:  Diagnosis Date   Degeneration of spine    Family history of adverse reaction to anesthesia    mother--- ponv   History of concussion    09/ 2013  no loc,  no residual   Hypertension    goes to urgent care , no regular pcp,  (stress echo 12-21-2009 in epic, normal)   Peripheral neuropathy    per pt states told due to degenerative spine   Wears glasses    Wrist fracture, closed, left, initial encounter 03/25/2020    Past Surgical History:  Procedure Laterality Date   NO PAST SURGERIES     ORIF WRIST FRACTURE Left 03/31/2020   Procedure: OPEN REDUCTION INTERNAL FIXATION (ORIF) WRIST FRACTURE;  Surgeon: Renette Butters, MD;  Location: [place];  Service: Orthopedics;  Laterality: Left;    Current Medications: Current Meds  Medication Sig   Ascorbic Acid (VITAMIN C PO) Take by mouth daily.   Capsaicin 0.1 % CREA Apply 1 application topically 4 (four) times daily as needed.   Cholecalciferol (VITAMIN D3 PO) Take 1 tablet by mouth daily.   Cyanocobalamin (VITAMIN B-12 PO) Take 1 tablet by mouth daily.   diphenhydrAMINE (BENADRYL) 25 MG tablet Take 25 mg by mouth daily as needed.   L-ARGININE PO Take 1 tablet by mouth daily.   [DISCONTINUED] cyclobenzaprine (FLEXERIL) 10 MG tablet Take 1 tablet (10 mg total) by mouth daily as needed for muscle spasms.   [DISCONTINUED] gabapentin (NEURONTIN) 800 MG tablet Take 1 tablet (800 mg total) by mouth 3 (three) times daily.     Allergies:   Fish allergy, Tomato, Pear, and Strawberry (diagnostic)   Social History   Socioeconomic History   Marital status: Single    Spouse name: Not on file   Number of children: Not on file   Years of education: Not on file   Highest education level: Not on file  Occupational History   Not on file  Tobacco Use   Smoking status: Former    Years: 10.00    Types: Cigarettes    Quit date: 2017    Years since quitting: 6.0   Smokeless tobacco: Never  Vaping Use   Vaping Use: Former   Substances:  CBD   Devices: vapeppresso  Substance and Sexual Activity   Alcohol use: Not Currently    Comment: occasional   Drug use: Yes    Types: Marijuana    Comment: 03-30-2020 per pt smokes average 2-3g per week for neuropathy   Sexual activity: Not on file  Other Topics Concern   Not on file  Social History Narrative   Not on file   Social Determinants of Health   Financial Resource Strain: Not on file  Food Insecurity: Not on file  Transportation Needs: Not on file  Physical Activity: Not on file  Stress: Not on file  Social Connections: Not on file     Family History: The patient's family history includes Diabetes in his mother; Healthy  in his father.  ROS:   Please see the history of present illness.     All other systems reviewed and are negative.  EKGs/Labs/Other Studies Reviewed:    The following studies were reviewed today:   EKG:  EKG is not ordered today.    Recent Labs: 12/17/2020: Hemoglobin 14.1; Platelets 238 08/24/2021: ALT 32; BUN 13; Creatinine, Ser 1.18; Potassium 5.2; Sodium 140  Recent Lipid Panel    Component Value Date/Time   CHOL 235 (H) 12/18/2020 0444   TRIG 99 12/18/2020 0444   HDL 50 12/18/2020 0444   CHOLHDL 4.7 12/18/2020 0444   VLDL 20 12/18/2020 0444   LDLCALC 165 (H) 12/18/2020 0444    Physical Exam:    VS:  BP (!) 142/96   Ht _0  (1.88 m)   Wt 275 lb 6.4 oz (124.9 kg)   BMI 35.36 kg/m     Wt Readings from Last 3 Encounters:  10/22/21 284 lb (128.8 kg)  08/24/21 278 lb 12.8 oz (126.5 kg)  07/20/21 275 lb 6.4 oz (124.9 kg)     GEN:  Well nourished, well developed in no acute distress HEENT: Normal NECK: No JVD; No carotid bruits LYMPHATICS: No lymphadenopathy CARDIAC: RRR, no murmurs, rubs, gallops RESPIRATORY:  Clear to auscultation without rales, wheezing or rhonchi  ABDOMEN: Soft, non-tender, non-distended MUSCULOSKELETAL:  No edema; No deformity  SKIN: Warm and dry NEUROLOGIC:  Alert and oriented x 3 PSYCHIATRIC:  Normal affect   ASSESSMENT:    1. Preoperative examination   2. CAD in native artery   3. Hyperlipidemia, unspecified hyperlipidemia type   4. Essential hypertension   5. Pain of left lower extremity     PLAN:     Preop evaluation: Planning wrist surgery. Low risk surgery, will be done with axillary block/MAC.  Recent coronary CTA showed nonobstructive CAD and echocardiogram showed no structural heart disease.  No further cardiac work-up recommended prior to surgery.  No history of obstructive CAD/PCI, OK to hold aspirin for surgery  CAD: He was admitted from 12/17/2020 through 12/18/2020 to [facility] with chest pain.  EKG unremarkable, negative  troponins.  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  Echocardiogram showed normal biventricular function, no significant valvular disease. -Continue aspirin 81 mg -Continue rosuvastatin 10 mg daily  HLD: LDL 165 on 12/18/2020 with nonobstructive CAD on coronary CTA.  Started rosuvastatin 10 mg daily but reports has been off medication for the last month.  Will restart rosuvastatin 10 mg daily and check fasting lipid panel in 3 months.  Goal LDL less than 70.  HTN: Had been on lisinopril 20 mg daily and amlodipine 10 mg daily but ran out of meds last month.  We will  restart his antihypertensives and ask him to check BP daily for next 2 weeks and call with results  Leg pain: Will check ABIs  RTC in 6 months  Medication Adjustments/Labs and Tests Ordered: Current medicines are reviewed at length with the patient today.  Concerns regarding medicines are outlined above.  Orders Placed This Encounter  Procedures   Lipid Profile   VAS [place] LE ART SEG MULTI (Segm&LE Reynauds)    Meds ordered this encounter  Medications   amLODipine (NORVASC) 10 MG tablet    Sig: Take 1 tablet (10 mg total) by mouth daily.    Dispense:  90 tablet    Refill:  3    Dose increase   lisinopril (ZESTRIL) 20 MG tablet    Sig: Take 1 tablet (20 mg total) by mouth daily.    Dispense:  30 tablet    Refill:  2   rosuvastatin (CRESTOR) 10 MG tablet    Sig: Take 1 tablet (10 mg total) by mouth daily.    Dispense:  90 tablet    Refill:  2     Patient Instructions  Medication Instructions:  Your physician recommends that you continue on your current medications as directed. Please refer to the Current Medication list given to you today.  *If you need a refill on your cardiac medications before your next appointment, please call your pharmacy*   Lab Work: Fasting Lipids in 3 months  If you have labs (blood work) drawn today and your tests are completely normal,  you will receive your results only by: [place] (if you have MyChart) OR A paper copy in the mail If you have any lab test that is abnormal or we need to change your treatment, we will call you to review the results.   Testing/Procedures:  Your physician has requested that you have an ankle brachial index (ABI). During this test an ultrasound and blood pressure cuff are used to evaluate the arteries that supply the arms and legs with blood. Allow thirty minutes for this exam. There are no restrictions or special instructions.    Follow-Up: At [facility], you and your health needs are our priority.  As part of our continuing mission to provide you with exceptional heart care, we have created designated Provider Care Teams.  These Care Teams include your primary Cardiologist (physician) and Advanced Practice Providers (APPs -  Physician Assistants and Nurse Practitioners) who all work together to provide you with the care you need, when you need it.  We recommend signing up for the patient portal called "MyChart".  Sign up information is provided on this After Visit Summary.  MyChart is used to connect with patients for Virtual Visits (Telemedicine).  Patients are able to view lab/test results, encounter notes, upcoming appointments, etc.  Non-urgent messages can be sent to your provider as well.   To learn more about what you can do with MyChart, go to [email or web address].    Your next appointment:   6 month(s)  The format for your next appointment:   In Person  Provider:   Oswaldo Milian, MD   Other Instructions Check your BP daily for 2 weeks and please record on paper and call us with the numbers [phone]      Signed, Donato Heinz, MD  11/19/2021 11:10 PM    [place]

## 2021-07-20 NOTE — Patient Instructions (Signed)
Medication Instructions:  Your physician recommends that you continue on your current medications as directed. Please refer to the Current Medication list given to you today.  *If you need a refill on your cardiac medications before your next appointment, please call your pharmacy*   Lab Work: Fasting Lipids in 3 months  If you have labs (blood work) drawn today and your tests are completely normal, you will receive your results only by: MyChart Message (if you have MyChart) OR A paper copy in the mail If you have any lab test that is abnormal or we need to change your treatment, we will call you to review the results.   Testing/Procedures:  Your physician has requested that you have an ankle brachial index (ABI). During this test an ultrasound and blood pressure cuff are used to evaluate the arteries that supply the arms and legs with blood. Allow thirty minutes for this exam. There are no restrictions or special instructions.    Follow-Up: At [facility], you and your health needs are our priority.  As part of our continuing mission to provide you with exceptional heart care, we have created designated Provider Care Teams.  These Care Teams include your primary Cardiologist (physician) and Advanced Practice Providers (APPs -  Physician Assistants and Nurse Practitioners) who all work together to provide you with the care you need, when you need it.  We recommend signing up for the patient portal called "MyChart".  Sign up information is provided on this After Visit Summary.  MyChart is used to connect with patients for Virtual Visits (Telemedicine).  Patients are able to view lab/test results, encounter notes, upcoming appointments, etc.  Non-urgent messages can be sent to your provider as well.   To learn more about what you can do with MyChart, go to [email or web address].    Your next appointment:   6 month(s)  The format for your next appointment:   In Person  Provider:    Epifanio Lesches, MD   Other Instructions Check your BP daily for 2 weeks and please record on paper and call us with the numbers [phone]

## 2021-07-23 ENCOUNTER — Other Ambulatory Visit (HOSPITAL_COMMUNITY): Payer: Self-pay | Admitting: Cardiology

## 2021-07-23 DIAGNOSIS — I739 Peripheral vascular disease, unspecified: Secondary | ICD-10-CM

## 2021-07-30 ENCOUNTER — Ambulatory Visit (HOSPITAL_COMMUNITY)
Admission: RE | Admit: 2021-07-30 | Payer: BC Managed Care – PPO | Source: Ambulatory Visit | Attending: Cardiology | Admitting: Cardiology

## 2021-08-20 ENCOUNTER — Other Ambulatory Visit: Payer: Self-pay

## 2021-08-20 ENCOUNTER — Ambulatory Visit (INDEPENDENT_AMBULATORY_CARE_PROVIDER_SITE_OTHER): Payer: BC Managed Care – PPO

## 2021-08-20 ENCOUNTER — Ambulatory Visit (HOSPITAL_COMMUNITY)
Admission: EM | Admit: 2021-08-20 | Discharge: 2021-08-20 | Disposition: A | Discharge status: Home / Self Care | Payer: BC Managed Care – PPO | Attending: Internal Medicine | Admitting: Internal Medicine

## 2021-08-20 ENCOUNTER — Encounter (HOSPITAL_COMMUNITY): Payer: Self-pay | Admitting: Emergency Medicine

## 2021-08-20 DIAGNOSIS — N23 Unspecified renal colic: Secondary | ICD-10-CM

## 2021-08-20 DIAGNOSIS — N390 Urinary tract infection, site not specified: Secondary | ICD-10-CM | POA: Diagnosis not present

## 2021-08-20 DIAGNOSIS — R109 Unspecified abdominal pain: Secondary | ICD-10-CM

## 2021-08-20 DIAGNOSIS — R319 Hematuria, unspecified: Secondary | ICD-10-CM | POA: Diagnosis not present

## 2021-08-20 LAB — POCT URINALYSIS DIPSTICK, ED / UC
Bilirubin Urine: NEGATIVE
Glucose, UA: NEGATIVE mg/dL
Ketones, ur: NEGATIVE mg/dL
Nitrite: NEGATIVE
Protein, ur: NEGATIVE mg/dL
Specific Gravity, Urine: 1.025 (ref 1.005–1.030)
Urobilinogen, UA: 0.2 mg/dL (ref 0.0–1.0)
pH: 5.5 (ref 5.0–8.0)

## 2021-08-20 MED ORDER — TAMSULOSIN HCL 0.4 MG PO CAPS: 0.4000 mg | ORAL_CAPSULE | Freq: Every day | ORAL | 0 refills | Status: DC

## 2021-08-20 MED ORDER — CEPHALEXIN 500 MG PO CAPS: 500.0000 mg | ORAL_CAPSULE | Freq: Two times a day (BID) | ORAL | 0 refills | Status: AC

## 2021-08-20 NOTE — ED Triage Notes (Signed)
Pt is present today with lower back pain, urinary frequency, and dysuria. Pt states sx started Friday

## 2021-08-20 NOTE — Discharge Instructions (Signed)
Take the Keflex twice a day for the next 7 days.   Take the Flomax daily at night.    You can take Tylenol and/or Ibuprofen as needed for pain relief and fever reduction.   Make sure you are drinking plenty of fluids, especially water.  You can drink cranberry juice to help with symptom relief, but make sure it is cranberry juice and not cranberry cocktail.  You can also try AZO, cranberry pills, or pyridium as needed.    Follow up with your primary care provider for re-evaluation.    If your pain gets worse, you are unable to urinate, or urinate bright red blood, please go to the ED for further evaluation.

## 2021-08-20 NOTE — ED Provider Notes (Signed)
[facility]    CSN: [id number] Arrival date & time: 08/20/21  1224      History   Chief Complaint Chief Complaint  Patient presents with   Back Pain   Dysuria   Urinary Frequency    HPI Ernest Williams is a 50 y.o. male.   Patient here for evaluation of bilateral lower back pain, dysuria, and urinary frequency that has been ongoing since Friday.  Does report having some hematuria several weeks ago but states symptoms have resolved.  Reports back pain is worse when laying down or putting pressure on back.  Denies any trauma, injury, or other precipitating event.  Denies any specific alleviating or aggravating factors.  Denies any fevers, chest pain, shortness of breath, N/V/D, numbness, tingling, weakness, abdominal pain, or headaches.    The history is provided by the patient.  Back Pain Associated symptoms: dysuria   Dysuria Presenting symptoms: dysuria   Associated symptoms: urinary frequency   Urinary Frequency   Past Medical History:  Diagnosis Date   Degeneration of spine    Family history of adverse reaction to anesthesia    mother--- ponv   History of concussion    09/ 2013  no loc,  no residual   Hypertension    goes to urgent care , no regular pcp,  (stress echo 12-21-2009 in epic, normal)   Peripheral neuropathy    per pt states told due to degenerative spine   Wears glasses    Wrist fracture, closed, left, initial encounter 03/25/2020    Patient Active Problem List   Diagnosis Date Noted   Prediabetes 04/24/2021   HLD (hyperlipidemia) 12/18/2020   CAD (coronary artery disease) 12/18/2020   HTN (hypertension) 12/18/2020   Chest pain 12/17/2020    Past Surgical History:  Procedure Laterality Date   NO PAST SURGERIES     ORIF WRIST FRACTURE Left 03/31/2020   Procedure: OPEN REDUCTION INTERNAL FIXATION (ORIF) WRIST FRACTURE;  Surgeon: Sheral Apley, MD;  Location: [facility] [place];  Service: Orthopedics;  Laterality: Left;        Home Medications    Prior to Admission medications   Medication Sig Start Date End Date Taking? Authorizing Provider  cephALEXin (KEFLEX) 500 MG capsule Take 1 capsule (500 mg total) by mouth 2 (two) times daily for 7 days. 08/20/21 08/27/21 Yes Ivette Loyal, NP  tamsulosin (FLOMAX) 0.4 MG CAPS capsule Take 1 capsule (0.4 mg total) by mouth daily after supper. 08/20/21  Yes Ivette Loyal, NP  amLODipine (NORVASC) 10 MG tablet Take 1 tablet (10 mg total) by mouth daily. 07/20/21   [name], MD  Ascorbic Acid (VITAMIN C PO) Take by mouth daily.    [provider]  aspirin EC 81 MG EC tablet Take 1 tablet (81 mg total) by mouth daily. Swallow whole. Patient not taking: Reported on 07/20/2021 12/19/20   Georgie Chard D, NP  Capsaicin 0.1 % CREA Apply 1 application topically 4 (four) times daily as needed. 03/08/21   Domenick Gong, MD  Cholecalciferol (VITAMIN D3 PO) Take 1 tablet by mouth daily.    [provider]  Cyanocobalamin (VITAMIN B-12 PO) Take 1 tablet by mouth daily.    [provider]  cyclobenzaprine (FLEXERIL) 10 MG tablet Take 1 tablet (10 mg total) by mouth daily as needed for muscle spasms. 07/19/21   Jones Bales, NP  diphenhydrAMINE (BENADRYL) 25 MG tablet Take 25 mg by mouth daily as needed.  [provider]  gabapentin (NEURONTIN) 800 MG tablet Take 1 tablet (800 mg total) by mouth 3 (three) times daily. 07/19/21   Jones Bales, NP  L-ARGININE PO Take 1 tablet by mouth daily.    [provider]  lisinopril (ZESTRIL) 20 MG tablet Take 1 tablet (20 mg total) by mouth daily. 07/20/21   [name], MD  nitroGLYCERIN (NITROSTAT) 0.4 MG SL tablet Place 1 tablet (0.4 mg total) under the tongue every 5 (five) minutes x 3 doses as needed for chest pain. Patient not taking: Reported on 07/20/2021 12/18/20   Georgie Chard D, NP  rosuvastatin (CRESTOR) 10 MG tablet Take 1 tablet (10 mg total) by mouth  daily. 07/20/21   [name], MD    Family History Family History  Problem Relation Age of Onset   Diabetes Mother    Healthy Father     Social History Social History   Tobacco Use   Smoking status: Former    Years: 10.00    Types: Cigarettes    Quit date: 2017    Years since quitting: 5.7   Smokeless tobacco: Never  Vaping Use   Vaping Use: Former   Devices: vapeppresso  Substance Use Topics   Alcohol use: Not Currently    Comment: occasional   Drug use: Yes    Types: Marijuana    Comment: 03-30-2020 per pt smokes average 2-3g per week for neuropathy     Allergies   Fish allergy, Tomato, Pear, and Strawberry (diagnostic)   Review of Systems Review of Systems  Genitourinary:  Positive for dysuria and frequency.  Musculoskeletal:  Positive for back pain.  All other systems reviewed and are negative.   Physical Exam Triage Vital Signs ED Triage Vitals  Enc Vitals Group     BP 08/20/21 1405 119/84     Pulse Rate 08/20/21 1405 92     Resp 08/20/21 1405 18     Temp 08/20/21 1405 98.4 F (36.9 C)     Temp src --      SpO2 08/20/21 1408 98 %     Weight --      Height --      Head Circumference --      Peak Flow --      Pain Score 08/20/21 1404 8     Pain Loc --      Pain Edu? --      Excl. in GC? --    No data found.  Updated Vital Signs BP 119/84   Pulse 92   Temp 98.4 F (36.9 C)   Resp 18   SpO2 98%   Visual Acuity Right Eye Distance:   Left Eye Distance:   Bilateral Distance:    Right Eye Near:   Left Eye Near:    Bilateral Near:     Physical Exam Vitals and nursing note reviewed.  Constitutional:      General: He is not in acute distress.    Appearance: Normal appearance. He is not ill-appearing, toxic-appearing or diaphoretic.  HENT:     Head: Normocephalic and atraumatic.  Eyes:     Conjunctiva/sclera: Conjunctivae normal.  Cardiovascular:     Rate and Rhythm: Normal rate.     Pulses: Normal pulses.  Pulmonary:      Effort: Pulmonary effort is normal.  Abdominal:     General: Abdomen is flat.     Palpations: Abdomen is soft.     Tenderness: There is right CVA tenderness  and left CVA tenderness.  Musculoskeletal:        General: Normal range of motion.     Cervical back: Normal range of motion.  Skin:    General: Skin is warm and dry.  Neurological:     General: No focal deficit present.     Mental Status: He is alert and oriented to person, place, and time.  Psychiatric:        Mood and Affect: Mood normal.     UC Treatments / Results  Labs (all labs ordered are listed, but only abnormal results are displayed) Labs Reviewed  POCT URINALYSIS DIPSTICK, ED / UC - Abnormal; Notable for the following components:      Result Value   Hgb urine dipstick TRACE (*)    Leukocytes,Ua SMALL (*)    All other components within normal limits  URINE CULTURE    EKG   Radiology DG Abdomen 1 View  Result Date: 08/20/2021 CLINICAL DATA:  Flank pain, hematuria EXAM: ABDOMEN - 1 VIEW COMPARISON:  None. FINDINGS: The bowel gas pattern is normal. No radio-opaque calculi or other significant radiographic abnormality are seen. IMPRESSION: Nonobstructive pattern of bowel gas. No obvious urinary tract calculi identified radiographically. CT is the test of choice for the assessment of known or suspected urinary tract calculi. Electronically Signed   By: Jearld Lesch M.D.   On: 08/20/2021 15:11    Procedures Procedures (including critical care time)  Medications Ordered in UC Medications - No data to display  Initial Impression / Assessment and Plan / UC Course  I have reviewed the triage vital signs and the nursing notes.  Pertinent labs & imaging results that were available during my care of the patient were reviewed by me and considered in my medical decision making (see chart for details).    Assessment negative for red flags or concerns.  Urinalysis with trace hemoglobin and small leukocytes,  urine culture pending.  X-ray with no acute abnormality and did not visualize any kidney stones.  However based on symptoms concern is for UTI versus renal colic.  We will treat with Keflex twice daily for the next 7 days.  If urine culture is negative patient may stop antibiotics.  Flomax nightly and aggressive hydration.  Tylenol and or ibuprofen as needed.  Follow-up with PCP for reevaluation as soon as possible.  Strict ED follow-up for any worsening pain, inability to urinate, or frank hematuria. Final Clinical Impressions(s) / UC Diagnoses   Final diagnoses:  Lower urinary tract infectious disease  Renal colic     Discharge Instructions      Take the Keflex twice a day for the next 7 days.   Take the Flomax daily at night.    You can take Tylenol and/or Ibuprofen as needed for pain relief and fever reduction.   Make sure you are drinking plenty of fluids, especially water.  You can drink cranberry juice to help with symptom relief, but make sure it is cranberry juice and not cranberry cocktail.  You can also try AZO, cranberry pills, or pyridium as needed.    Follow up with your primary care provider for re-evaluation.    If your pain gets worse, you are unable to urinate, or urinate bright red blood, please go to the ED for further evaluation.      ED Prescriptions     Medication Sig Dispense Auth. Provider   cephALEXin (KEFLEX) 500 MG capsule Take 1 capsule (500 mg total) by mouth 2 (two)  times daily for 7 days. 14 capsule Ivette Loyal, NP   tamsulosin (FLOMAX) 0.4 MG CAPS capsule Take 1 capsule (0.4 mg total) by mouth daily after supper. 30 capsule Ivette Loyal, NP      PDMP not reviewed this encounter.   Ivette Loyal, NP 08/20/21 1525

## 2021-08-21 LAB — URINE CULTURE: Culture: NO GROWTH

## 2021-08-24 ENCOUNTER — Other Ambulatory Visit: Payer: Self-pay

## 2021-08-24 ENCOUNTER — Ambulatory Visit (INDEPENDENT_AMBULATORY_CARE_PROVIDER_SITE_OTHER): Payer: BC Managed Care – PPO | Admitting: Family Medicine

## 2021-08-24 ENCOUNTER — Encounter (HOSPITAL_BASED_OUTPATIENT_CLINIC_OR_DEPARTMENT_OTHER): Payer: Self-pay | Admitting: Family Medicine

## 2021-08-24 VITALS — BP 120/74 | HR 96 | Ht 74.0 in | Wt 278.8 lb

## 2021-08-24 DIAGNOSIS — R7303 Prediabetes: Secondary | ICD-10-CM | POA: Diagnosis not present

## 2021-08-24 DIAGNOSIS — I10 Essential (primary) hypertension: Secondary | ICD-10-CM | POA: Diagnosis not present

## 2021-08-24 NOTE — Assessment & Plan Note (Signed)
Saw cardiology recently, had not been taking statin or blood pressure medications for about a month or so prior to appointment cardiology.  Was restarted on these medications by cardiology Denies any issues with headaches, chest pain, shortness of breath or dizziness Recommend continuing with lisinopril and amlodipine as prescribed Blood pressure at goal in office today

## 2021-08-24 NOTE — Patient Instructions (Signed)
  Medication Instructions:  Your physician recommends that you continue on your current medications as directed. Please refer to the Current Medication list given to you today. --If you need a refill on any your medications before your next appointment, please call your pharmacy first. If no refills are authorized on file call the office.-- Lab Work: Your physician has recommended that you have lab work today: CMP, A1C If you have labs (blood work) drawn today and your tests are completely normal, you will receive your results via MyChart message OR a phone call from our staff.  Please ensure you check your voicemail in the event that you authorized detailed messages to be left on a delegated number. If you have any lab test that is abnormal or we need to change your treatment, we will call you to review the results.  Follow-Up: Your next appointment:   Your physician recommends that you schedule a follow-up appointment in: 3-4 MONTHS with Dr. de Peru  You will receive a text message or e-mail with a link to a survey about your care and experience with [place] today! We would greatly appreciate your feedback!   Thanks for letting us be apart of your health journey!!  Primary Care and Sports Medicine   Dr. Ceasar Mons Peru   We encourage you to activate your patient portal called "MyChart".  Sign up information is provided on this After Visit Summary.  MyChart is used to connect with patients for Virtual Visits (Telemedicine).  Patients are able to view lab/test results, encounter notes, upcoming appointments, etc.  Non-urgent messages can be sent to your provider as well. To learn more about what you can do with MyChart, please visit --  [email or web address].

## 2021-08-24 NOTE — Progress Notes (Signed)
    Procedures performed today:    None.  Independent interpretation of notes and tests performed by another provider:   None.  Brief History, Exam, Impression, and Recommendations:    BP 120/74   Pulse 96   Ht 6\' 2"  (1.88 m)   Wt 278 lb 12.8 oz (126.5 kg)   SpO2 96%   BMI 35.80 kg/m   Prediabetes Has been making some lifestyle modifications We will check hemoglobin A1c to monitor status Recommend continued lifestyle modifications, focusing on dietary changes, reporting processed foods, avoiding simple sugars, limiting carbs in diet  HTN (hypertension) Saw cardiology recently, had not been taking statin or blood pressure medications for about a month or so prior to appointment cardiology.  Was restarted on these medications by cardiology Denies any issues with headaches, chest pain, shortness of breath or dizziness Recommend continuing with lisinopril and amlodipine as prescribed Blood pressure at goal in office today  Plan for follow-up in about 3 to 4 months or sooner as needed.  Follow-up on blood pressure, lifestyle modifications, discuss timing for CPE   ___________________________________________  de , MD, ABFM, CAQSM Primary Care and Sports Medicine [facility]

## 2021-08-24 NOTE — Assessment & Plan Note (Signed)
Has been making some lifestyle modifications We will check hemoglobin A1c to monitor status Recommend continued lifestyle modifications, focusing on dietary changes, reporting processed foods, avoiding simple sugars, limiting carbs in diet

## 2021-08-25 LAB — COMPREHENSIVE METABOLIC PANEL
ALT: 32 IU/L (ref 0–44)
AST: 25 IU/L (ref 0–40)
Albumin/Globulin Ratio: 1.8 (ref 1.2–2.2)
Albumin: 4.8 g/dL (ref 3.8–4.9)
Alkaline Phosphatase: 73 IU/L (ref 44–121)
BUN/Creatinine Ratio: 11 (ref 9–20)
BUN: 13 mg/dL (ref 6–24)
Bilirubin Total: 0.3 mg/dL (ref 0.0–1.2)
CO2: 23 mmol/L (ref 20–29)
Calcium: 9.9 mg/dL (ref 8.7–10.2)
Chloride: 101 mmol/L (ref 96–106)
Creatinine, Ser: 1.18 mg/dL (ref 0.76–1.27)
Globulin, Total: 2.6 g/dL (ref 1.5–4.5)
Glucose: 108 mg/dL — ABNORMAL HIGH (ref 70–99)
Potassium: 5.2 mmol/L (ref 3.5–5.2)
Sodium: 140 mmol/L (ref 134–144)
Total Protein: 7.4 g/dL (ref 6.0–8.5)
eGFR: 74 mL/min/{1.73_m2} (ref >59)

## 2021-08-25 LAB — HEMOGLOBIN A1C
Est. average glucose Bld gHb Est-mCnc: 143 mg/dL
Hgb A1c MFr Bld: 6.6 % — ABNORMAL HIGH (ref 4.8–5.6)

## 2021-08-28 ENCOUNTER — Telehealth (HOSPITAL_BASED_OUTPATIENT_CLINIC_OR_DEPARTMENT_OTHER): Payer: Self-pay

## 2021-08-28 NOTE — Telephone Encounter (Signed)
Results released by Dr. de Peru and reviewed by patient via MyChart Instructed patient to contact the office with any questions or concerns.  Will send to scheduler to contact for 1-2 f/u

## 2021-08-28 NOTE — Telephone Encounter (Signed)
-----   Message from Hosie Poisson Peru, MD sent at 08/27/2021 11:58 AM EDT ----- Electrolytes, kidney function and liver function are all normal.  Hemoglobin A1c has increased since prior check about a month ago.  Hemoglobin A1c is now at 6.6% which falls within diabetes range.  Given this change, recommend following up sooner than discussed in office, would recommend office visit in the next 1 to 2 months to discuss labs and next steps in management.

## 2021-09-29 IMAGING — DX DG WRIST COMPLETE 3+V*L*
1 series · 1 of 1 positions shown · non-contrast
Comparison: None.

CLINICAL DATA: Fall with left wrist pain and swelling.

EXAM:
LEFT WRIST - COMPLETE 3+ VIEW

[wrist lat]
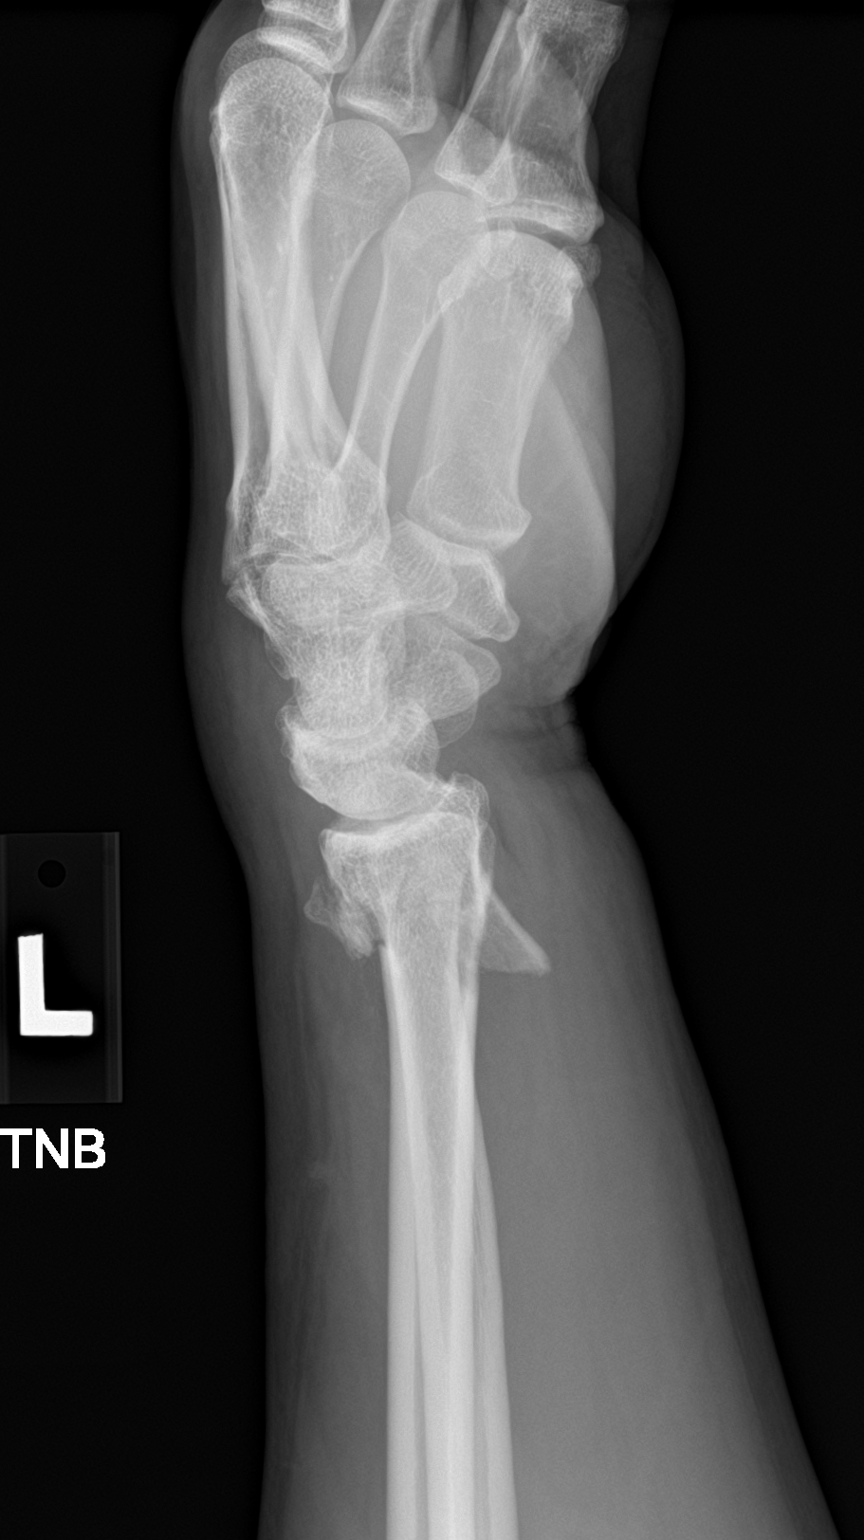

[1 of 1 positions shown; findings below may reference images not displayed]

FINDINGS: Comminuted fracture of the distal radius. There is a transverse
fracture component with additional fractures that extend to the
distal radial articular surface, between the radial and lunate
facets. A component of the fracture is displaced in a volar
direction by 1 cm, with significant dorsal angulation. There is also
dorsal angulation of the distal radial articular surface measuring
26 degrees.

There is an associated mildly displaced ulnar styloid fracture.

No dislocation.

There is surrounding soft tissue swelling.
IMPRESSION: 1. Comminuted, dorsally impacted intra-articular fracture of the
distal radius with dorsal angulation of the distal radial articular
surface of 26 degrees. Associated mildly displaced ulnar styloid
fracture. No dislocation.

## 2021-10-22 ENCOUNTER — Other Ambulatory Visit: Payer: Self-pay

## 2021-10-22 ENCOUNTER — Encounter: Payer: Self-pay | Admitting: Registered Nurse

## 2021-10-22 ENCOUNTER — Encounter: Payer: BC Managed Care – PPO | Attending: Registered Nurse | Admitting: Registered Nurse

## 2021-10-22 VITALS — BP 128/83 | HR 90 | Ht 74.0 in | Wt 284.0 lb

## 2021-10-22 DIAGNOSIS — G894 Chronic pain syndrome: Secondary | ICD-10-CM | POA: Diagnosis not present

## 2021-10-22 DIAGNOSIS — M62838 Other muscle spasm: Secondary | ICD-10-CM

## 2021-10-22 DIAGNOSIS — Z5181 Encounter for therapeutic drug level monitoring: Secondary | ICD-10-CM | POA: Diagnosis not present

## 2021-10-22 DIAGNOSIS — M5416 Radiculopathy, lumbar region: Secondary | ICD-10-CM

## 2021-10-22 DIAGNOSIS — G5642 Causalgia of left upper limb: Secondary | ICD-10-CM | POA: Diagnosis not present

## 2021-10-22 DIAGNOSIS — Z79899 Other long term (current) drug therapy: Secondary | ICD-10-CM

## 2021-10-22 MED ORDER — GABAPENTIN 800 MG PO TABS: 800.0000 mg | ORAL_TABLET | Freq: Three times a day (TID) | ORAL | 3 refills | Status: DC

## 2021-10-22 MED ORDER — CYCLOBENZAPRINE HCL 10 MG PO TABS: 10.0000 mg | ORAL_TABLET | Freq: Two times a day (BID) | ORAL | 3 refills | Status: DC | PRN

## 2021-10-22 NOTE — Progress Notes (Signed)
Subjective:    Patient ID: Ernest Williams, male    DOB: March 02, 1971, 50 y.o.   MRN: [id number]  HPI: Ernest Williams is a 50 y.o. male who returns for follow up appointment for chronic pain and medication refill. He states his pain is located in his left wrist and lower back pain radiating into her left lower extremity with tingling and burning. He rates his pain 6. His current exercise regime is walking and performing stretching exercises.  Ernest Williams states he's having increase intensity of neuropathic pain, we will obtain a UDS , he verbalizes understanding. Reviewed the narcotic contract.     Pain Inventory Average Pain 6 Pain Right Now 6 My pain is constant, sharp, burning, and tingling  In the last 24 hours, has pain interfered with the following? General activity 6 Relation with others 6 Enjoyment of life 6 What TIME of day is your pain at its worst? morning  and daytime Sleep (in general) Poor  Pain is worse with: standing Pain improves with: therapy/exercise and medication Relief from Meds: 4  Family History  Problem Relation Age of Onset   Diabetes Mother    Healthy Father    Social History   Socioeconomic History   Marital status: Single    Spouse name: Not on file   Number of children: Not on file   Years of education: Not on file   Highest education level: Not on file  Occupational History   Not on file  Tobacco Use   Smoking status: Former    Years: 10.00    Types: Cigarettes    Quit date: 2017    Years since quitting: 5.9   Smokeless tobacco: Never  Vaping Use   Vaping Use: Former   Devices: vapeppresso  Substance and Sexual Activity   Alcohol use: Not Currently    Comment: occasional   Drug use: Yes    Types: Marijuana    Comment: 03-30-2020 per pt smokes average 2-3g per week for neuropathy   Sexual activity: Not on file  Other Topics Concern   Not on file  Social History Narrative   Not on file   Social Determinants of Health   Financial  Resource Strain: Not on file  Food Insecurity: Not on file  Transportation Needs: Not on file  Physical Activity: Not on file  Stress: Not on file  Social Connections: Not on file   Past Surgical History:  Procedure Laterality Date   NO PAST SURGERIES     ORIF WRIST FRACTURE Left 03/31/2020   Procedure: OPEN REDUCTION INTERNAL FIXATION (ORIF) WRIST FRACTURE;  Surgeon: Sheral Apley, MD;  Location: [facility] [place];  Service: Orthopedics;  Laterality: Left;   Past Surgical History:  Procedure Laterality Date   NO PAST SURGERIES     ORIF WRIST FRACTURE Left 03/31/2020   Procedure: OPEN REDUCTION INTERNAL FIXATION (ORIF) WRIST FRACTURE;  Surgeon: Sheral Apley, MD;  Location: [facility] [place];  Service: Orthopedics;  Laterality: Left;   Past Medical History:  Diagnosis Date   Degeneration of spine    Family history of adverse reaction to anesthesia    mother--- ponv   History of concussion    09/ 2013  no loc,  no residual   Hypertension    goes to urgent care , no regular pcp,  (stress echo 12-21-2009 in epic, normal)   Peripheral neuropathy    per pt states told due to degenerative spine   Wears glasses  Wrist fracture, closed, left, initial encounter 03/25/2020   There were no vitals taken for this visit.  Opioid Risk Score:   Fall Risk Score:  `1  Depression screen PHQ 2/9  Depression screen [facility] 2/9 06/21/2021 05/11/2021 04/24/2021 04/24/2021  Decreased Interest 0 0 0 0  Down, Depressed, Hopeless 0 0 0 0  PHQ - 2 Score 0 0 0 0  Altered sleeping - 0 - 1  Tired, decreased energy - 0 - 1  Change in appetite - 0 - 0  Feeling bad or failure about yourself  - 0 - 0  Trouble concentrating - 0 - 0  Moving slowly or fidgety/restless - 0 - 0  Suicidal thoughts - 0 - 0  PHQ-9 Score - 0 - 2  Difficult doing work/chores - - - Not difficult at all    Review of Systems  Musculoskeletal:  Positive for back pain.       Left wrist pain  All other  systems reviewed and are negative.     Objective:   Physical Exam Vitals and nursing note reviewed.  Constitutional:      Appearance: Normal appearance.  Cardiovascular:     Rate and Rhythm: Normal rate and regular rhythm.     Pulses: Normal pulses.     Heart sounds: Normal heart sounds.  Pulmonary:     Effort: Pulmonary effort is normal.     Breath sounds: Normal breath sounds.  Musculoskeletal:     Cervical back: Normal range of motion and neck supple.     Comments: Normal Muscle Bulk and Muscle Testing Reveals:  Upper Extremities: Full ROM and Muscle Strength 5/5  Lumbar Paraspinal Tenderness: L-3-L-5 Lower Extremities: Full ROM and Muscle Strength 5/5 Left Lower Extremity Flexion Produces Pain into her left lower extremity Arises from Table Slowly Narrow Based  Gait     Skin:    General: Skin is warm and dry.  Neurological:     Mental Status: He is alert and oriented to person, place, and time.  Psychiatric:        Mood and Affect: Mood normal.        Behavior: Behavior normal.         Assessment & Plan:  Complex Regional Pain Syndrome Type 2 : Increased : Gabapentin 800 mg TID. He was instructed to call office in two weeks to evaluate medication change. 10/22/2021 Cervicalgia/ Cervical Radiculitis: Continue current medication regimen. Continue to Monitor. Continue HEP as Tolerated. 10/22/2021 Left Lumbar Radiculitis: Continue Gabapentin. Continue to Monitor. 10/22/2021   F/U in 6 months.

## 2021-10-29 ENCOUNTER — Ambulatory Visit (HOSPITAL_BASED_OUTPATIENT_CLINIC_OR_DEPARTMENT_OTHER): Payer: BC Managed Care – PPO | Admitting: Family Medicine

## 2021-11-01 LAB — DRUG TOX MONITOR 1 W/CONF, ORAL FLD
Amphetamines: NEGATIVE ng/mL (ref <10)
Barbiturates: NEGATIVE ng/mL (ref <10)
Benzodiazepines: NEGATIVE ng/mL (ref <0.50)
Buprenorphine: NEGATIVE ng/mL (ref <0.10)
Cocaine: NEGATIVE ng/mL (ref <5.0)
Cotinine: 63.8 ng/mL — ABNORMAL HIGH (ref <5.0)
Fentanyl: NEGATIVE ng/mL (ref <0.10)
Heroin Metabolite: NEGATIVE ng/mL (ref <1.0)
MARIJUANA: POSITIVE ng/mL — AB (ref <2.5)
MDMA: NEGATIVE ng/mL (ref <10)
Meprobamate: NEGATIVE ng/mL (ref <2.5)
Methadone: NEGATIVE ng/mL (ref <5.0)
Nicotine Metabolite: POSITIVE ng/mL — AB (ref <5.0)
Opiates: NEGATIVE ng/mL (ref <2.5)
Phencyclidine: NEGATIVE ng/mL (ref <10)
THC: 32.3 ng/mL — ABNORMAL HIGH (ref <2.5)
Tapentadol: NEGATIVE ng/mL (ref <5.0)
Tramadol: NEGATIVE ng/mL (ref <5.0)
Zolpidem: NEGATIVE ng/mL (ref <5.0)

## 2021-11-01 LAB — DRUG TOX ALC METAB W/CON, ORAL FLD: Alcohol Metabolite: NEGATIVE ng/mL (ref <25)

## 2021-11-06 ENCOUNTER — Telehealth: Payer: Self-pay | Admitting: *Deleted

## 2021-11-06 NOTE — Telephone Encounter (Signed)
Oral drug screen is positive for marijuana. He will not be eligible for narcotic prescriptions. I have sent a letter through MyChart.

## 2021-11-13 ENCOUNTER — Ambulatory Visit (HOSPITAL_BASED_OUTPATIENT_CLINIC_OR_DEPARTMENT_OTHER): Payer: BC Managed Care – PPO | Admitting: Family Medicine

## 2021-12-04 ENCOUNTER — Encounter (HOSPITAL_BASED_OUTPATIENT_CLINIC_OR_DEPARTMENT_OTHER): Payer: Self-pay | Admitting: Family Medicine

## 2021-12-04 ENCOUNTER — Other Ambulatory Visit: Payer: Self-pay

## 2021-12-04 ENCOUNTER — Ambulatory Visit (INDEPENDENT_AMBULATORY_CARE_PROVIDER_SITE_OTHER): Payer: BC Managed Care – PPO | Admitting: Family Medicine

## 2021-12-04 VITALS — BP 148/96 | HR 104 | Ht 74.0 in | Wt 274.0 lb

## 2021-12-04 DIAGNOSIS — I10 Essential (primary) hypertension: Secondary | ICD-10-CM | POA: Diagnosis not present

## 2021-12-04 DIAGNOSIS — M545 Low back pain, unspecified: Secondary | ICD-10-CM | POA: Insufficient documentation

## 2021-12-04 DIAGNOSIS — Z0000 Encounter for general adult medical examination without abnormal findings: Secondary | ICD-10-CM

## 2021-12-04 DIAGNOSIS — R6882 Decreased libido: Secondary | ICD-10-CM | POA: Insufficient documentation

## 2021-12-04 DIAGNOSIS — E119 Type 2 diabetes mellitus without complications: Secondary | ICD-10-CM | POA: Insufficient documentation

## 2021-12-04 DIAGNOSIS — E1169 Type 2 diabetes mellitus with other specified complication: Secondary | ICD-10-CM | POA: Insufficient documentation

## 2021-12-04 NOTE — Assessment & Plan Note (Signed)
Recent onset of low back pain over last few days Does not recall any specific injury Has had intermittent back issues for many years Denies any radiation of symptoms, no radiculopathy Denies any associated numbness or tingling No red flags such as bowel or bladder incontinence On exam, does have some mild tenderness through right thoracic and lumbar paraspinal muscles, no specific spasm appreciated Discussed treatment options including physical therapy, home exercise program, OTC measures Patient would prefer to continue with conservative measures, home exercise program, will hold off on PT for now

## 2021-12-04 NOTE — Patient Instructions (Signed)
°  Medication Instructions:  Your physician recommends that you continue on your current medications as directed. Please refer to the Current Medication list given to you today. --If you need a refill on any your medications before your next appointment, please call your pharmacy first. If no refills are authorized on file call the office.-- Lab Work: Your physician has recommended that you have lab 1 week prior to CPE - CBC, CMP, Lipid, A1C, TSH, and Testosterone If you have labs (blood work) drawn today and your tests are completely normal, you will receive your results via MyChart message OR a phone call from our staff.  Please ensure you check your voicemail in the event that you authorized detailed messages to be left on a delegated number. If you have any lab test that is abnormal or we need to change your treatment, we will call you to review the results.  Follow-Up: Your next appointment:   Your physician recommends that you schedule a follow-up appointment in: 4-6 WEEKS with Dr. de Peru  You will receive a text message or e-mail with a link to a survey about your care and experience with [place] today! We would greatly appreciate your feedback!   Thanks for letting us be apart of your health journey!!  Primary Care and Sports Medicine   Dr. Ceasar Mons Peru   We encourage you to activate your patient portal called "MyChart".  Sign up information is provided on this After Visit Summary.  MyChart is used to connect with patients for Virtual Visits (Telemedicine).  Patients are able to view lab/test results, encounter notes, upcoming appointments, etc.  Non-urgent messages can be sent to your provider as well. To learn more about what you can do with MyChart, please visit --  [email or web address].

## 2021-12-04 NOTE — Assessment & Plan Note (Signed)
Blood pressure above goal in office today, was better controlled at most recent office visit Reports that he continues with amlodipine 10 mg and lisinopril 20 mg, tolerating medications well Denies any issues with chest pain, headaches Still following with cardiology Indicates that he has been checking his blood pressure at home, systolic readings have been in the 130s and 140s reportedly At present, will continue with current medications, continue with intermittent blood pressure checks at home

## 2021-12-04 NOTE — Assessment & Plan Note (Signed)
Patient reports decreased libido as well as fatigue as well as decreased interest in regards to exercise as well as having some decreased exercise tolerance He voices some concern related to possible low testosterone and is wanting to have this checked We will check testosterone with next lab draw

## 2021-12-04 NOTE — Assessment & Plan Note (Addendum)
Recent hemoglobin A1c found to be 6.6%, new diagnosis of diabetes for patient, previously was diagnosed with prediabetes Reviewed laboratory findings and new diagnosis.  Discussed risks/complications which can be associated with diabetes and importance of adequate blood sugar control Discussed need for neuropathy, nephropathy, retinopathy screening Discussed first-line therapies including metformin, GLP-1 receptor agonist At this time, patient declines initiation of any specific pharmacotherapy Will continue with lifestyle modifications Discussed need for pneumococcal vaccination, patient interested, administered today Plan to recheck hemoglobin A1c prior to next appointment

## 2021-12-04 NOTE — Progress Notes (Signed)
° ° °  Procedures performed today:    None.  Independent interpretation of notes and tests performed by another provider:   None.  Brief History, Exam, Impression, and Recommendations:    BP (!) 148/96    Pulse (!) 104    Ht 6\' 2"  (1.88 m)    Wt 274 lb (124.3 kg)    SpO2 97%    BMI 35.18 kg/m   Diabetes mellitus (HCC) Recent hemoglobin A1c found to be 6.6%, new diagnosis of diabetes for patient, previously was diagnosed with prediabetes Reviewed laboratory findings and new diagnosis.  Discussed risks/complications which can be associated with diabetes and importance of adequate blood sugar control Discussed need for neuropathy, nephropathy, retinopathy screening Discussed first-line therapies including metformin, GLP-1 receptor agonist At this time, patient declines initiation of any specific pharmacotherapy Will continue with lifestyle modifications Discussed need for pneumococcal vaccination, patient interested, administered today Plan to recheck hemoglobin A1c prior to next appointment  HTN (hypertension) Blood pressure above goal in office today, was better controlled at most recent office visit Reports that he continues with amlodipine 10 mg and lisinopril 20 mg, tolerating medications well Denies any issues with chest pain, headaches Still following with cardiology Indicates that he has been checking his blood pressure at home, systolic readings have been in the 130s and 140s reportedly At present, will continue with current medications, continue with intermittent blood pressure checks at home  Decreased libido Patient reports decreased libido as well as fatigue as well as decreased interest in regards to exercise as well as having some decreased exercise tolerance He voices some concern related to possible low testosterone and is wanting to have this checked We will check testosterone with next lab draw  Low back pain Recent onset of low back pain over last few days Does  not recall any specific injury Has had intermittent back issues for many years Denies any radiation of symptoms, no radiculopathy Denies any associated numbness or tingling No red flags such as bowel or bladder incontinence On exam, does have some mild tenderness through right thoracic and lumbar paraspinal muscles, no specific spasm appreciated Discussed treatment options including physical therapy, home exercise program, OTC measures Patient would prefer to continue with conservative measures, home exercise program, will hold off on PT for now  Spent 45 minutes on this patient encounter, including preparation, chart review, face-to-face counseling with patient and coordination of care, and documentation of encounter  Plan for follow-up in about 6 weeks for CPE, nurse visit only prior   ___________________________________________  de , MD, ABFM, CAQSM Primary Care and Sports Medicine [facility]

## 2021-12-07 ENCOUNTER — Encounter (HOSPITAL_BASED_OUTPATIENT_CLINIC_OR_DEPARTMENT_OTHER): Payer: Self-pay | Admitting: Family Medicine

## 2021-12-28 ENCOUNTER — Other Ambulatory Visit: Payer: Self-pay | Admitting: Registered Nurse

## 2022-01-15 ENCOUNTER — Encounter (HOSPITAL_BASED_OUTPATIENT_CLINIC_OR_DEPARTMENT_OTHER): Payer: BC Managed Care – PPO | Admitting: Family Medicine

## 2022-01-20 NOTE — Progress Notes (Deleted)
**Note Ernest-Identified via Obfuscation** ?Cardiology Office Note:   ? ?Date:  01/20/2022  ? ?ID:  Ernest Williams, DOB 1971/07/18, MRN [id number] ? ?PCP:  Ernest Williams, Raymond J, MD  ?Cardiologist:  None  ?Electrophysiologist:  None  ? ?Referring MD: Ernest Williams, Ernest Kos, MD  ? ?No chief complaint on file. ? ? ? ?History of Present Illness:   ? ?Ernest Williams is a 51 y.o. male with a hx of hypertension, peripheral neuropathy of unknown etiology, significant family history of early CAD who presents for hospital follow-up.  He was admitted from 12/17/2020 through 12/18/2020 to [facility] with chest pain.  EKG unremarkable, negative troponins.  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  Echocardiogram showed normal biventricular function, no significant valvular disease. ? ?Since last clinic visit, he reports that he has been off his statin and blood pressure medicines for the last month.  Reports chest pain has improved, occurring rarely.  Denies any dyspnea, lightheadedness, syncope, lower extremity edema, or palpitations.  Reports he has been riding stationary bike for 30 minutes 3 times per week, denies any exertional chest pain.  Does report has been having pain in the back of his left leg. ? ? ?Past Medical History:  ?Diagnosis Date  ? Degeneration of spine   ? Family history of adverse reaction to anesthesia   ? mother--- ponv  ? History of concussion   ? 09/ 2013  no loc,  no residual  ? Hypertension   ? goes to urgent care , no regular pcp,  (stress echo 12-21-2009 in epic, normal)  ? Peripheral neuropathy   ? per pt states told due to degenerative spine  ? Wears glasses   ? Wrist fracture, closed, left, initial encounter 03/25/2020  ? ? ?Past Surgical History:  ?Procedure Laterality Date  ? NO PAST SURGERIES    ? ORIF WRIST FRACTURE Left 03/31/2020  ? Procedure: OPEN REDUCTION INTERNAL FIXATION (ORIF) WRIST FRACTURE;  Surgeon: Ernest Apley, MD;  Location: [facility];  Service: Orthopedics;   Laterality: Left;  ? ? ?Current Medications: ?No outpatient medications have been marked as taking for the 01/24/22 encounter (Appointment) with [name], MD.  ?  ? ?Allergies:   Fish allergy, Tomato, Pear, and Strawberry (diagnostic)  ? ?Social History  ? ?Socioeconomic History  ? Marital status: Single  ?  Spouse name: Not on file  ? Number of children: Not on file  ? Years of education: Not on file  ? Highest education level: Not on file  ?Occupational History  ? Not on file  ?Tobacco Use  ? Smoking status: Former  ?  Years: 10.00  ?  Types: Cigarettes  ?  Quit date: 2017  ?  Years since quitting: 6.1  ? Smokeless tobacco: Never  ?Vaping Use  ? Vaping Use: Former  ? Substances: CBD  ? Devices: vapeppresso  ?Substance and Sexual Activity  ? Alcohol use: Not Currently  ?  Comment: occasional  ? Drug use: Yes  ?  Types: Marijuana  ?  Comment: 03-30-2020 per pt smokes average 2-3g per week for neuropathy  ? Sexual activity: Not on file  ?Other Topics Concern  ? Not on file  ?Social History Narrative  ? Not on file  ? ?Social Determinants of Health  ? ?Financial Resource Strain: Not on file  ?Food Insecurity: Not on file  ?Transportation Needs: Not on file  ?Physical Activity: Not on file  ?Stress: Not on file  ?Social Connections: Not  on file  ?  ? ?Family History: ?The patient's family history includes Diabetes in his mother; Healthy in his father. ? ?ROS:   ?Please see the history of present illness.    ? All other systems reviewed and are negative. ? ?EKGs/Labs/Other Studies Reviewed:   ? ?The following studies were reviewed today: ? ? ?EKG:  EKG is not ordered today.   ? ?Recent Labs: ?08/24/2021: ALT 32; BUN 13; Creatinine, Ser 1.18; Potassium 5.2; Sodium 140  ?Recent Lipid Panel ?   ?Component Value Date/Time  ? CHOL 235 (H) 12/18/2020 0444  ? TRIG 99 12/18/2020 0444  ? HDL 50 12/18/2020 0444  ? CHOLHDL 4.7 12/18/2020 0444  ? VLDL 20 12/18/2020 0444  ? LDLCALC 165 (H) 12/18/2020 0444   ? ? ?Physical Exam:   ? ?VS:  There were no vitals taken for this visit.   ? ?Wt Readings from Last 3 Encounters:  ?12/04/21 274 lb (124.3 kg)  ?10/22/21 284 lb (128.8 kg)  ?08/24/21 278 lb 12.8 oz (126.5 kg)  ?  ? ?GEN:  Well nourished, well developed in no acute distress ?HEENT: Normal ?NECK: No JVD; No carotid bruits ?LYMPHATICS: No lymphadenopathy ?CARDIAC: RRR, no murmurs, rubs, gallops ?RESPIRATORY:  Clear to auscultation without rales, wheezing or rhonchi  ?ABDOMEN: Soft, non-tender, non-distended ?MUSCULOSKELETAL:  No edema; No deformity  ?SKIN: Warm and dry ?NEUROLOGIC:  Alert and oriented x 3 ?PSYCHIATRIC:  Normal affect  ? ?ASSESSMENT:   ? ?No diagnosis found. ? ? ?PLAN:   ? ? ?CAD: He was admitted from 12/17/2020 through 12/18/2020 to [facility] with chest pain.  EKG unremarkable, negative troponins.  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  Echocardiogram showed normal biventricular function, no significant valvular disease. ?-Continue aspirin 81 mg ?-Continue rosuvastatin 10 mg daily ? ?HLD: LDL 165 on 12/18/2020 with nonobstructive CAD on coronary CTA.  Started rosuvastatin 10 mg daily, will recheck lipid panel ? ?HTN: On amlodipine 10 mg daily and lisinopril 20 mg daily ? ?Leg pain: Will check ABIs ? ?RTC in *** ? ?Medication Adjustments/Labs and Tests Ordered: ?Current medicines are reviewed at length with the patient today.  Concerns regarding medicines are outlined above.  ?No orders of the defined types were placed in this encounter. ? ? ?No orders of the defined types were placed in this encounter. ? ? ? ?There are no Patient Instructions on file for this visit.  ? ?Signed, ?[name], MD  ?01/20/2022 8:47 PM    ?[facility] ?

## 2022-01-24 ENCOUNTER — Ambulatory Visit: Payer: BC Managed Care – PPO | Admitting: Cardiology

## 2022-04-22 ENCOUNTER — Encounter: Payer: Self-pay | Attending: Registered Nurse | Admitting: Registered Nurse

## 2022-05-12 NOTE — Progress Notes (Deleted)
Cardiology Office Note:    Date:  05/12/2022   ID:  Ernest Williams, DOB 1971-01-16, MRN [id number]  PCP:  de [place], Raymond J, MD  Cardiologist:  None  Electrophysiologist:  None   Referring MD: de [place], Raymond J, MD   No chief complaint on file.    History of Present Illness:    Ernest Williams is a 51 y.o. male with a hx of hypertension, peripheral neuropathy of unknown etiology, significant family history of early CAD who presents for hospital follow-up.  He was admitted from 12/17/2020 through 12/18/2020 to [facility] with chest pain.  EKG unremarkable, negative troponins.  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  Echocardiogram showed normal biventricular function, no significant valvular disease.  Since last clinic visit,  he reports that he has been off his statin and blood pressure medicines for the last month.  Reports chest pain has improved, occurring rarely.  Denies any dyspnea, lightheadedness, syncope, lower extremity edema, or palpitations.  Reports he has been riding stationary bike for 30 minutes 3 times per week, denies any exertional chest pain.  Does report has been having pain in the back of his left leg.   Past Medical History:  Diagnosis Date   Degeneration of spine    Family history of adverse reaction to anesthesia    mother--- ponv   History of concussion    09/ 2013  no loc,  no residual   Hypertension    goes to urgent care , no regular pcp,  (stress echo 12-21-2009 in epic, normal)   Peripheral neuropathy    per pt states told due to degenerative spine   Wears glasses    Wrist fracture, closed, left, initial encounter 03/25/2020    Past Surgical History:  Procedure Laterality Date   NO PAST SURGERIES     ORIF WRIST FRACTURE Left 03/31/2020   Procedure: OPEN REDUCTION INTERNAL FIXATION (ORIF) WRIST FRACTURE;  Surgeon: Renette Butters, MD;  Location: [place];  Service: Orthopedics;   Laterality: Left;    Current Medications: No outpatient medications have been marked as taking for the 05/15/22 encounter (Appointment) with Donato Heinz, MD.     Allergies:   Fish allergy, Tomato, Pear, and Strawberry (diagnostic)   Social History   Socioeconomic History   Marital status: Single    Spouse name: Not on file   Number of children: Not on file   Years of education: Not on file   Highest education level: Not on file  Occupational History   Not on file  Tobacco Use   Smoking status: Former    Years: 10.00    Types: Cigarettes    Quit date: 2017    Years since quitting: 6.5   Smokeless tobacco: Never  Vaping Use   Vaping Use: Former   Substances: CBD   Devices: vapeppresso  Substance and Sexual Activity   Alcohol use: Not Currently    Comment: occasional   Drug use: Yes    Types: Marijuana    Comment: 03-30-2020 per pt smokes average 2-3g per week for neuropathy   Sexual activity: Not on file  Other Topics Concern   Not on file  Social History Narrative   Not on file   Social Determinants of Health   Financial Resource Strain: Not on file  Food Insecurity: Not on file  Transportation Needs: Not on file  Physical Activity: Not on file  Stress: Not on file  Social Connections:  Not on file     Family History: The patient's family history includes Diabetes in his mother; Healthy in his father.  ROS:   Please see the history of present illness.     All other systems reviewed and are negative.  EKGs/Labs/Other Studies Reviewed:    The following studies were reviewed today:   EKG:  EKG is not ordered today.    Recent Labs: 08/24/2021: ALT 32; BUN 13; Creatinine, Ser 1.18; Potassium 5.2; Sodium 140  Recent Lipid Panel    Component Value Date/Time   CHOL 235 (H) 12/18/2020 0444   TRIG 99 12/18/2020 0444   HDL 50 12/18/2020 0444   CHOLHDL 4.7 12/18/2020 0444   VLDL 20 12/18/2020 0444   LDLCALC 165 (H) 12/18/2020 0444    Physical  Exam:    VS:  There were no vitals taken for this visit.    Wt Readings from Last 3 Encounters:  12/04/21 274 lb (124.3 kg)  10/22/21 284 lb (128.8 kg)  08/24/21 278 lb 12.8 oz (126.5 kg)     GEN:  Well nourished, well developed in no acute distress HEENT: Normal NECK: No JVD; No carotid bruits LYMPHATICS: No lymphadenopathy CARDIAC: RRR, no murmurs, rubs, gallops RESPIRATORY:  Clear to auscultation without rales, wheezing or rhonchi  ABDOMEN: Soft, non-tender, non-distended MUSCULOSKELETAL:  No edema; No deformity  SKIN: Warm and dry NEUROLOGIC:  Alert and oriented x 3 PSYCHIATRIC:  Normal affect   ASSESSMENT:    No diagnosis found.   PLAN:     Preop evaluation: Planning wrist surgery. Low risk surgery, will be done with axillary block/MAC.  Recent coronary CTA showed nonobstructive CAD and echocardiogram showed no structural heart disease.  No further cardiac work-up recommended prior to surgery.  No history of obstructive CAD/PCI, OK to hold aspirin for surgery  CAD: He was admitted from 12/17/2020 through 12/18/2020 to [facility] with chest pain.  EKG unremarkable, negative troponins.  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  Echocardiogram showed normal biventricular function, no significant valvular disease. -Continue aspirin 81 mg -Continue rosuvastatin 10 mg daily  HLD: LDL 165 on 12/18/2020 with nonobstructive CAD on coronary CTA.  Started rosuvastatin 10 mg daily but reports has been off medication for the last month.  Will restart rosuvastatin 10 mg daily and check fasting lipid panel in 3 months.  Goal LDL less than 70.  HTN: Had been on lisinopril 20 mg daily and amlodipine 10 mg daily but ran out of meds last month.  We will restart his antihypertensives and ask him to check BP daily for next 2 weeks and call with results  Leg pain: Will check ABIs  RTC in***  Medication Adjustments/Labs and Tests Ordered: Current  medicines are reviewed at length with the patient today.  Concerns regarding medicines are outlined above.  No orders of the defined types were placed in this encounter.   No orders of the defined types were placed in this encounter.    There are no Patient Instructions on file for this visit.   Signed, Donato Heinz, MD  05/12/2022 10:17 PM    [place]

## 2022-05-15 ENCOUNTER — Ambulatory Visit: Payer: BC Managed Care – PPO | Admitting: Cardiology

## 2022-06-23 IMAGING — CT CT HEART MORP W/ CTA COR W/ SCORE W/ CA W/CM &/OR W/O CM
4 of 7 series · 8 of 20 positions shown, 9 images · IV contrast (APPLIED)
Comparison: None.

Addendum:
CLINICAL DATA: 49-year-old male with significant family history of
early CAD, uncontrolled hypertension and chest pain.

EXAM:
Cardiac/Coronary  CTA
TECHNIQUE: The patient was scanned on a [employer] Force scanner.

[Series 8: best diast 77 % · axial · 0.39mm/px · z∈[+1205,+1248]mm · 2 of 321 slices shown]
[im 107/321  vessel]
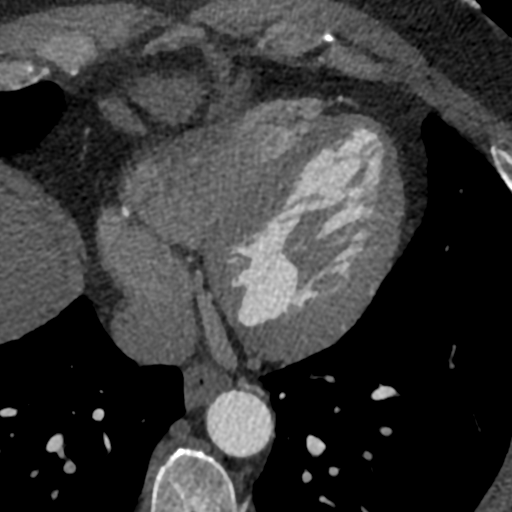
[im 214/321  vessel]
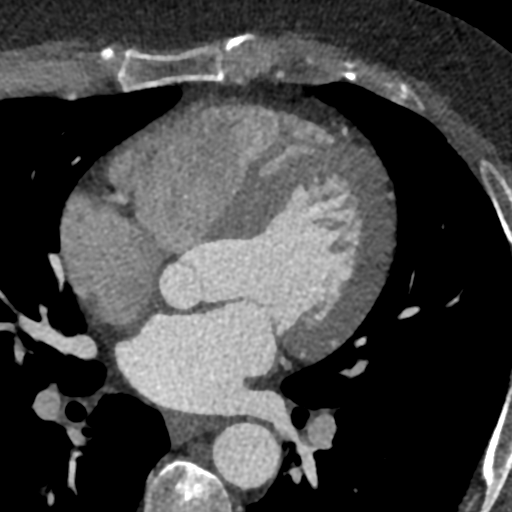

[Series 9: best syst · axial · 0.39mm/px · z∈[+1205,+1248]mm · 2 of 321 slices shown, 3 images]
[im 107/321  vessel]
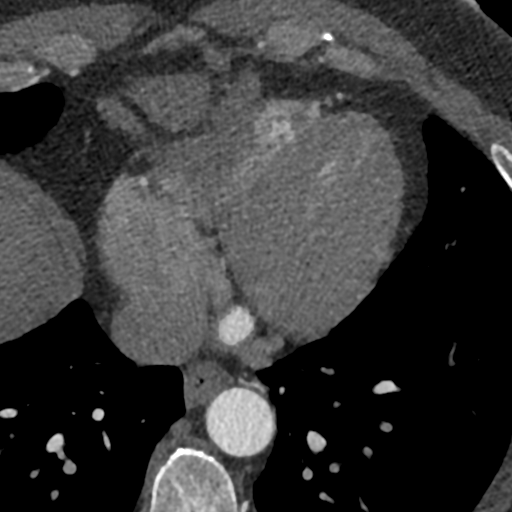
[im 107/321  lung]
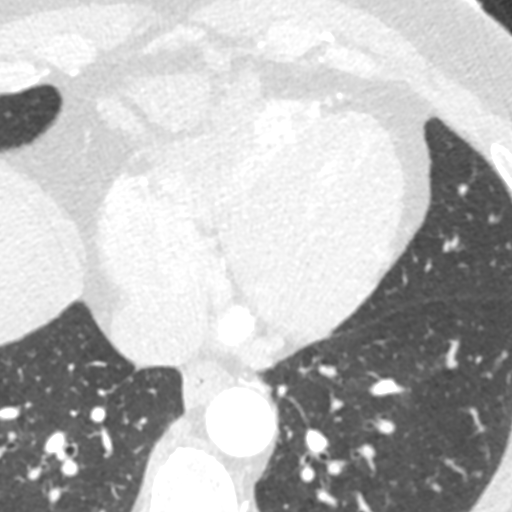
[im 214/321  vessel]
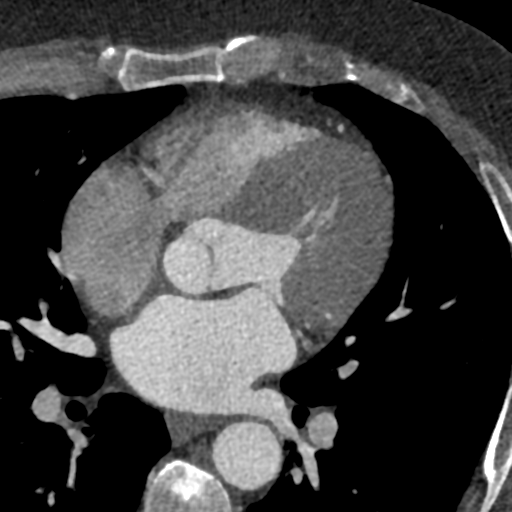

[Series 10: ts diast sharp 77 % · axial · 0.39mm/px · z∈[+1205,+1248]mm · 2 of 321 slices shown]
[im 107/321  lung]
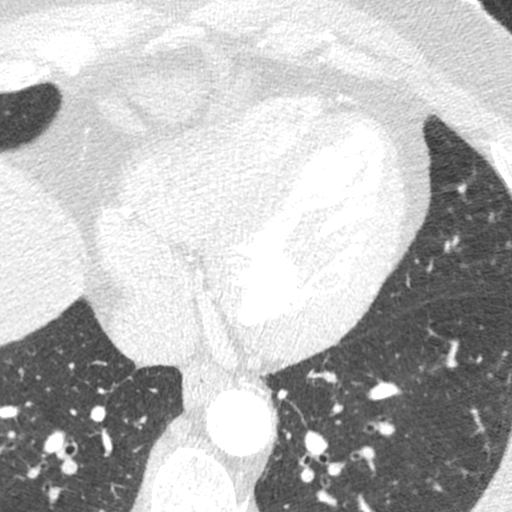
[im 214/321  lung]
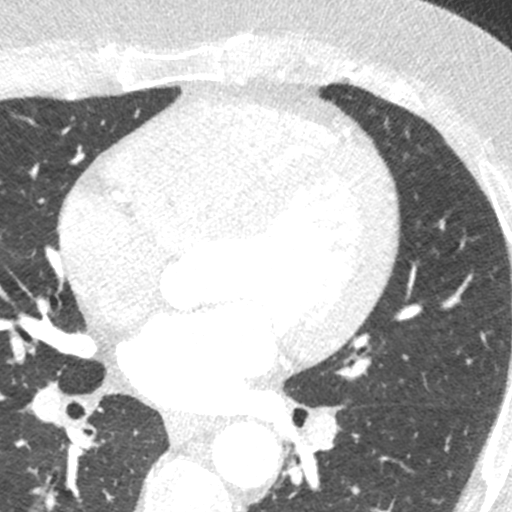

[Series 11: ts syst sharp · axial · 0.39mm/px · z∈[+1205,+1248]mm · 2 of 321 slices shown]
[im 107/321  lung]
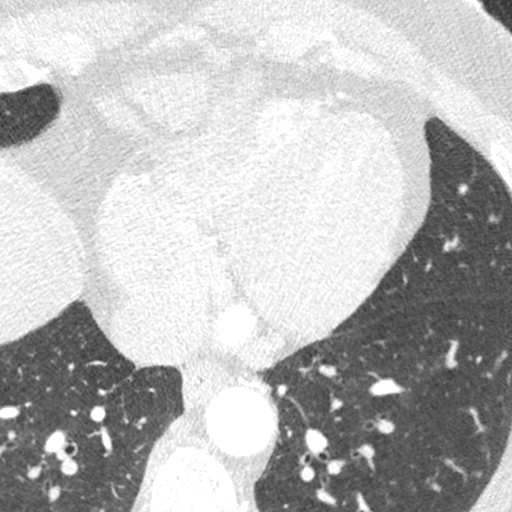
[im 214/321  lung]
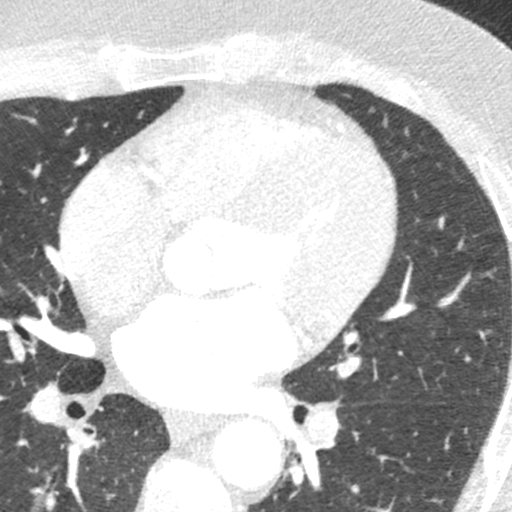

[8 of 20 positions shown; findings below may reference images not displayed]

FINDINGS: A 100 kV prospective scan was triggered in the descending thoracic
aorta at 111 HU's. Axial non-contrast 3 mm slices were carried out
through the heart. The data set was analyzed on a dedicated work
station and scored using the Agatson method. Gantry rotation speed
was 250 msecs and collimation was .6 mm. 100 mg of PO Metoprolol and
0.8 mg of sl NTG was given. The 3D data set was reconstructed in 5%
intervals of the 67-82 % of the R-R cycle. Diastolic phases were
analyzed on a dedicated work station using MPR, MIP and VRT modes.
The patient received 80 cc of contrast.

Aorta:  Normal size.  No calcifications.  No dissection.

Aortic Valve:  Trileaflet.  No calcifications.

Coronary Arteries:  Normal coronary origin.  Right dominance.

RCA is a large dominant artery that gives rise to PDA and PLA. There
is mild non-calcified plaque in the proximal to mid RCA with
stenosis 25-49%. Mid and distal RCA/PDA and PLA have only minimal
irregularities.

Left main is a large caliber artery that gives rise to LAD and LCX
arteries. Left main has no plaque.

LAD is a large caliber vessel that wraps around the apex and gives
rise to a diagonal artery. Mid LAD has mild mixed predominantly
calcified plaque with stenosis 25-49%.

D1 is a medium size artery with only minimal luminal irregularities.

LCX is a small non-dominant artery that gives rise to two large OM
branches. There minimal plaque.

OM1, OM2 are medium sized branches with only minimal irregularities.

Other findings:

Normal pulmonary vein drainage into the left atrium.

Normal left atrial appendage without a thrombus.

Normal size of the pulmonary artery.
IMPRESSION: 1. Coronary calcium score of 26. This was 85 percentile for age and
sex matched control.

2. Normal coronary origin with right dominance.

3. CAD-RADS 2. Mild non-obstructive CAD (25-49%). Consider
non-atherosclerotic causes of chest pain. Consider preventive
therapy and risk factor modification.

EXAM:
OVER-READ INTERPRETATION  CT CHEST

The following report is an over-read performed by radiologist Dr.
over-read does not include interpretation of cardiac or coronary
anatomy or pathology. The coronary CTA interpretation by the
cardiologist is attached.
FINDINGS: No incidental vascular findings. Visualized mediastinum and hilar
regions demonstrate no lymphadenopathy or masses. Visualized lungs
show evidence of pulmonary edema, consolidation, pneumothorax,
nodule or pleural fluid. Visualized upper abdomen and bony
structures are unremarkable.
IMPRESSION: No significant incidental findings.

*** End of Addendum ***
FINDINGS: A 100 kV prospective scan was triggered in the descending thoracic
aorta at 111 HU's. Axial non-contrast 3 mm slices were carried out
through the heart. The data set was analyzed on a dedicated work
station and scored using the Agatson method. Gantry rotation speed
was 250 msecs and collimation was .6 mm. 100 mg of PO Metoprolol and
0.8 mg of sl NTG was given. The 3D data set was reconstructed in 5%
intervals of the 67-82 % of the R-R cycle. Diastolic phases were
analyzed on a dedicated work station using MPR, MIP and VRT modes.
The patient received 80 cc of contrast.

Aorta:  Normal size.  No calcifications.  No dissection.

Aortic Valve:  Trileaflet.  No calcifications.

Coronary Arteries:  Normal coronary origin.  Right dominance.

RCA is a large dominant artery that gives rise to PDA and PLA. There
is mild non-calcified plaque in the proximal to mid RCA with
stenosis 25-49%. Mid and distal RCA/PDA and PLA have only minimal
irregularities.

Left main is a large caliber artery that gives rise to LAD and LCX
arteries. Left main has no plaque.

LAD is a large caliber vessel that wraps around the apex and gives
rise to a diagonal artery. Mid LAD has mild mixed predominantly
calcified plaque with stenosis 25-49%.

D1 is a medium size artery with only minimal luminal irregularities.

LCX is a small non-dominant artery that gives rise to two large OM
branches. There minimal plaque.

OM1, OM2 are medium sized branches with only minimal irregularities.

Other findings:

Normal pulmonary vein drainage into the left atrium.

Normal left atrial appendage without a thrombus.

Normal size of the pulmonary artery.
IMPRESSION: 1. Coronary calcium score of 26. This was 85 percentile for age and
sex matched control.

2. Normal coronary origin with right dominance.

3. CAD-RADS 2. Mild non-obstructive CAD (25-49%). Consider
non-atherosclerotic causes of chest pain. Consider preventive
therapy and risk factor modification.

## 2022-06-23 IMAGING — CR DG CHEST 2V
2 series · 2 of 2 positions shown · non-contrast
Comparison: Chest radiographs 09/28/2019 and earlier.

CLINICAL DATA: 49-year-old male with left side chest pain radiating
to the arm and neck. Shortness of breath. Former smoker.

EXAM:
CHEST - 2 VIEW

[chest pa]
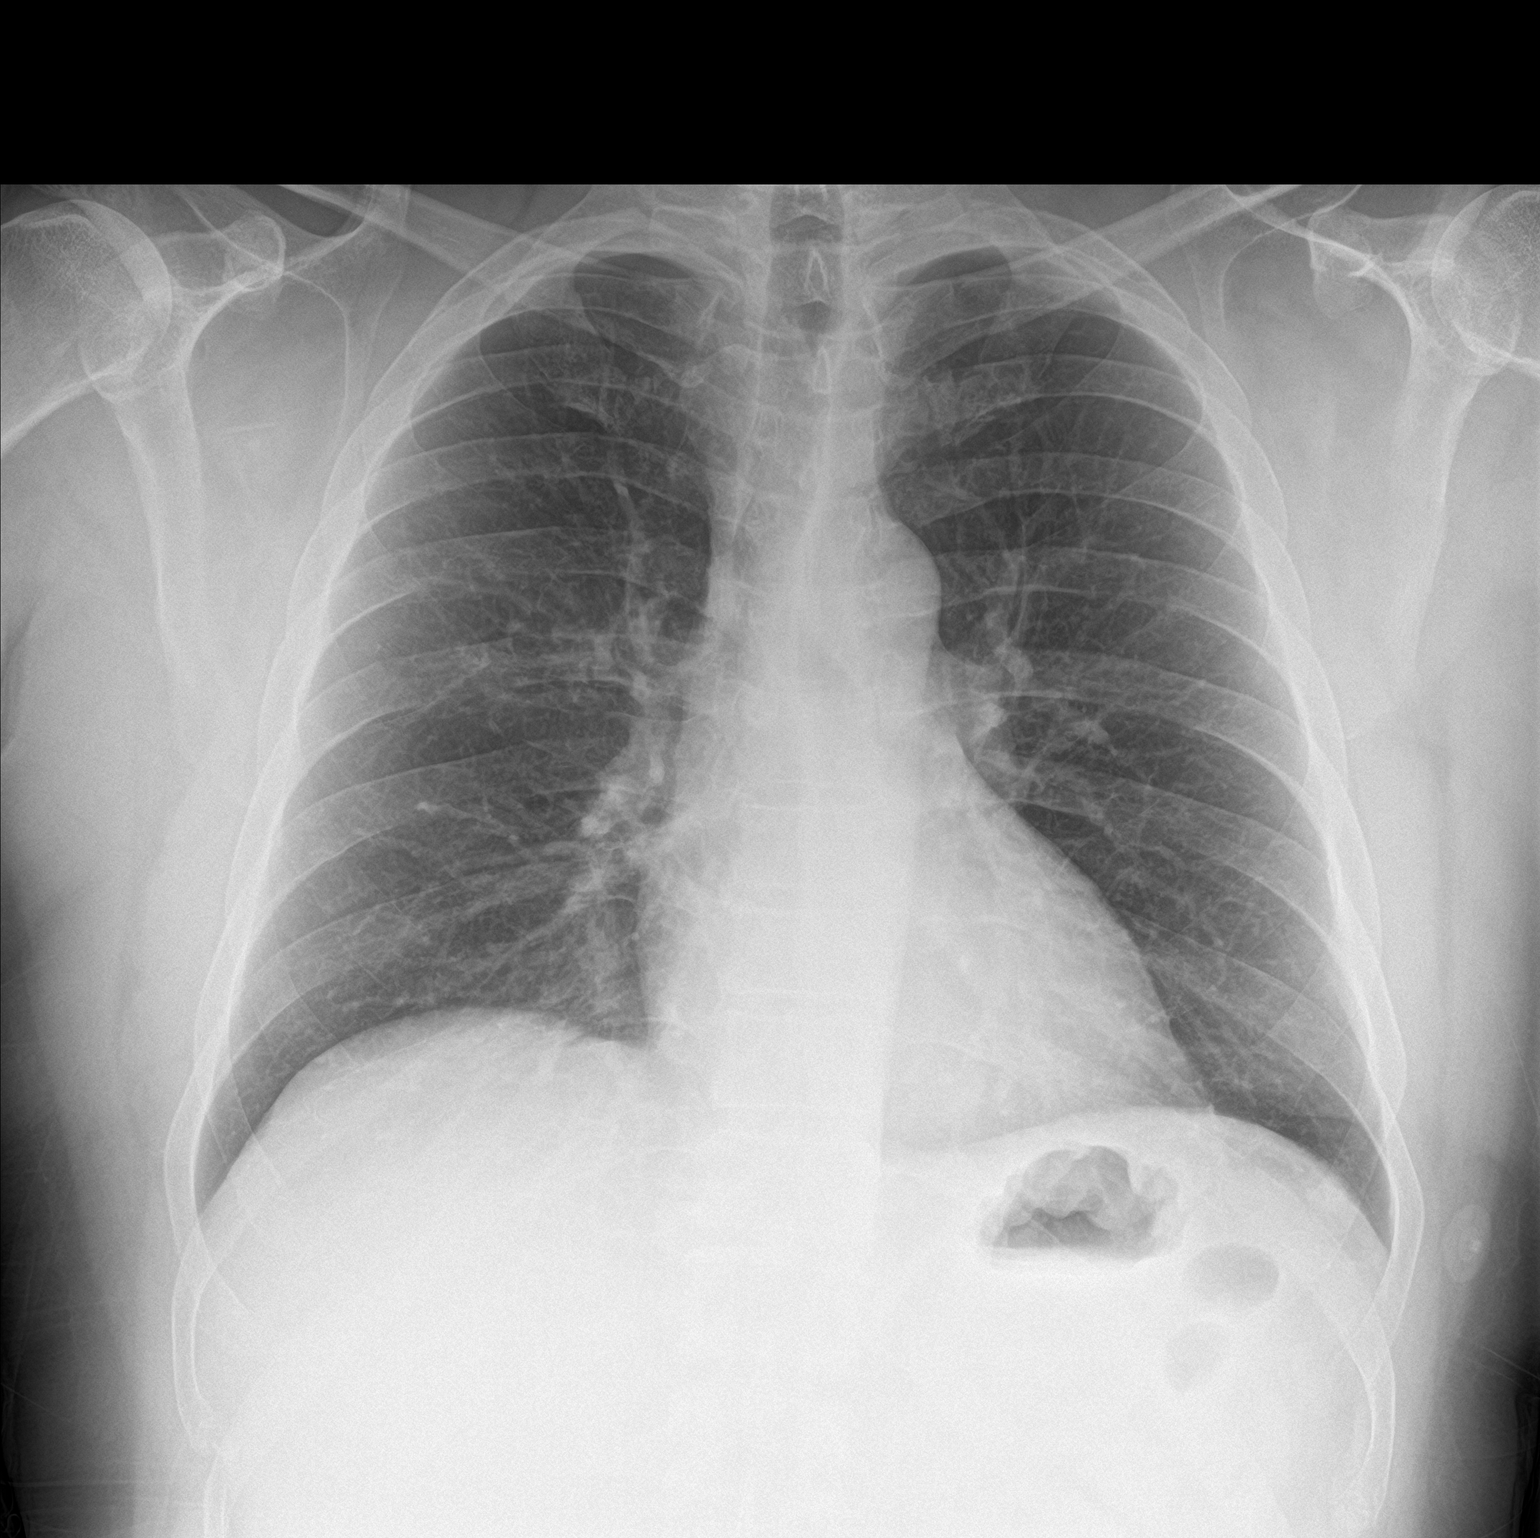

[chest lat]
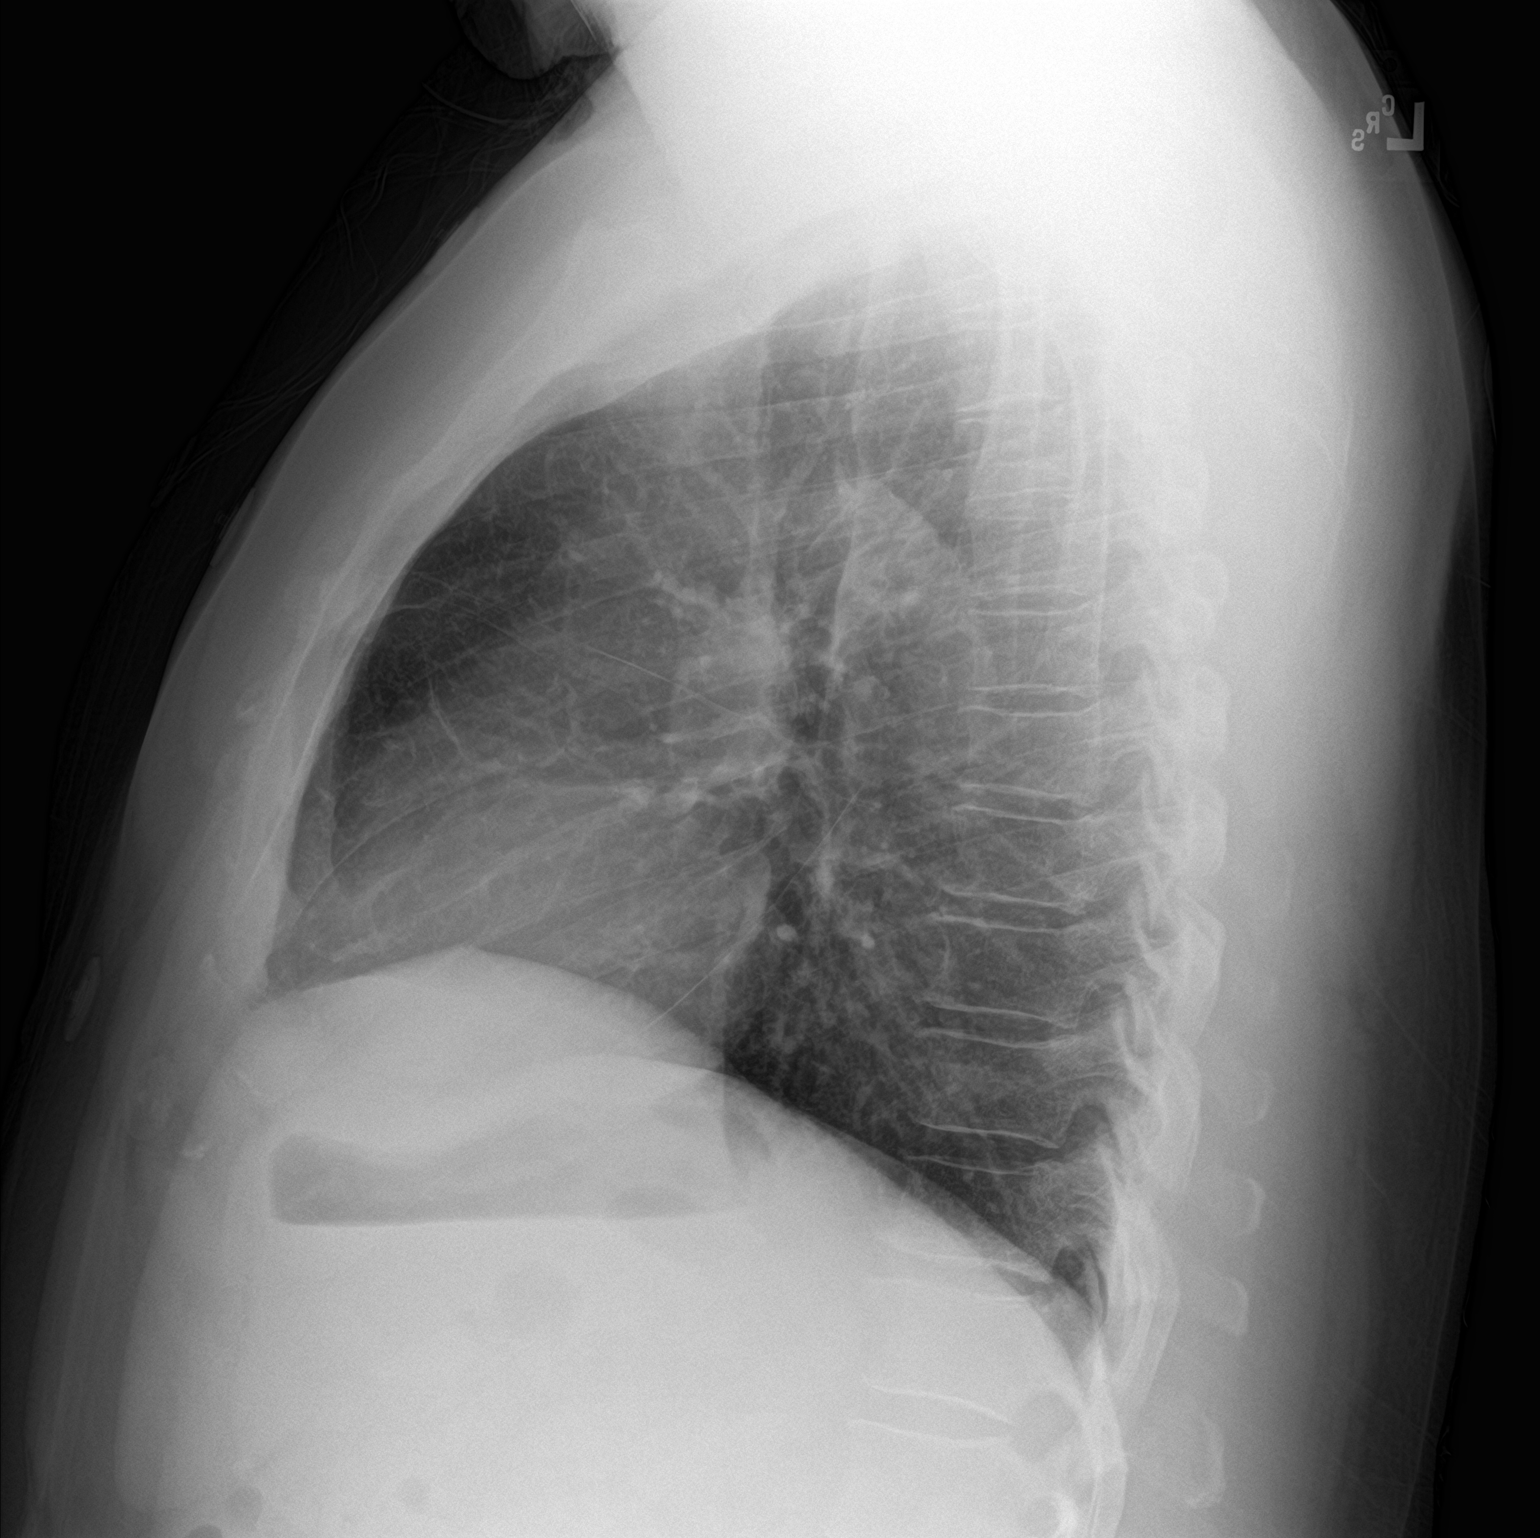

[2 of 2 positions shown; findings below may reference images not displayed]

FINDINGS: PA and lateral views. Lung volumes and mediastinal contours remain
normal. Visualized tracheal air column is within normal limits.
Subtle chronic increased interstitial markings about the right hilum
appear stable since 0398. No pneumothorax, pulmonary edema, pleural
effusion or acute pulmonary opacity.

No acute osseous abnormality identified. Negative visible bowel gas
pattern.
IMPRESSION: No acute cardiopulmonary abnormality.

## 2022-07-01 ENCOUNTER — Ambulatory Visit (INDEPENDENT_AMBULATORY_CARE_PROVIDER_SITE_OTHER): Payer: 59 | Admitting: Cardiology

## 2022-07-01 ENCOUNTER — Encounter: Payer: Self-pay | Admitting: Cardiology

## 2022-07-01 VITALS — BP 150/100 | HR 85 | Ht 74.0 in | Wt 273.2 lb

## 2022-07-01 DIAGNOSIS — I10 Essential (primary) hypertension: Secondary | ICD-10-CM

## 2022-07-01 DIAGNOSIS — E785 Hyperlipidemia, unspecified: Secondary | ICD-10-CM

## 2022-07-01 DIAGNOSIS — M79605 Pain in left leg: Secondary | ICD-10-CM

## 2022-07-01 DIAGNOSIS — I2510 Atherosclerotic heart disease of native coronary artery without angina pectoris: Secondary | ICD-10-CM | POA: Diagnosis not present

## 2022-07-01 NOTE — Progress Notes (Signed)
Cardiology Office Note:    Date:  07/01/2022   ID:  Ernest Williams, DOB 1971/06/30, MRN [id number]  PCP:  de [place], Raymond J, MD  Cardiologist:  None  Electrophysiologist:  None   Referring MD: de [place], Raymond J, MD   Chief Complaint  Patient presents with   Coronary Artery Disease    History of Present Illness:    Ernest Williams is a 51 y.o. male with a hx of hypertension, peripheral neuropathy of unknown etiology, significant family history of early CAD who presents for follow-up.  He was admitted from 12/17/2020 through 12/18/2020 to [facility] with chest pain.  EKG unremarkable, negative troponins.  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  Echocardiogram showed normal biventricular function, no significant valvular disease.  Since last clinic visit, he reports that he has been doing well.  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  Has lost 25 pounds.  Denies any exertional symptoms.  Does report intermittent chest pain, but does not occur with exertion.  Denies any dyspnea, headedness, syncope, lower extreme edema, or palpitations.  Did not take blood pressure medications today.  Past Medical History:  Diagnosis Date   Degeneration of spine    Family history of adverse reaction to anesthesia    mother--- ponv   History of concussion    09/ 2013  no loc,  no residual   Hypertension    goes to urgent care , no regular pcp,  (stress echo 12-21-2009 in epic, normal)   Peripheral neuropathy    per pt states told due to degenerative spine   Wears glasses    Wrist fracture, closed, left, initial encounter 03/25/2020    Past Surgical History:  Procedure Laterality Date   NO PAST SURGERIES     ORIF WRIST FRACTURE Left 03/31/2020   Procedure: OPEN REDUCTION INTERNAL FIXATION (ORIF) WRIST FRACTURE;  Surgeon: Sheral Apley, MD;  Location: [facility];  Service: Orthopedics;  Laterality: Left;     Current Medications: Current Meds  Medication Sig   amLODipine (NORVASC) 10 MG tablet Take 1 tablet (10 mg total) by mouth daily.   Ascorbic Acid (VITAMIN C PO) Take by mouth daily.   Capsaicin 0.1 % CREA Apply 1 application topically 4 (four) times daily as needed.   Cholecalciferol (VITAMIN D3 PO) Take 1 tablet by mouth daily.   Cyanocobalamin (VITAMIN B-12 PO) Take 1 tablet by mouth daily.   cyclobenzaprine (FLEXERIL) 10 MG tablet Take 1 tablet (10 mg total) by mouth 2 (two) times daily as needed for muscle spasms.   diphenhydrAMINE (BENADRYL) 25 MG tablet Take 25 mg by mouth daily as needed.   gabapentin (NEURONTIN) 800 MG tablet Take 1 tablet (800 mg total) by mouth 3 (three) times daily.   L-ARGININE PO Take 1 tablet by mouth daily.   lisinopril (ZESTRIL) 20 MG tablet Take 1 tablet (20 mg total) by mouth daily.   rosuvastatin (CRESTOR) 10 MG tablet Take 1 tablet (10 mg total) by mouth daily.   tamsulosin (FLOMAX) 0.4 MG CAPS capsule Take 1 capsule (0.4 mg total) by mouth daily after supper.   [DISCONTINUED] nitroGLYCERIN (NITROSTAT) 0.4 MG SL tablet Place 1 tablet (0.4 mg total) under the tongue every 5 (five) minutes x 3 doses as needed for chest pain.     Allergies:   Fish allergy, Tomato, Pear, and Strawberry (diagnostic)   Social History   Socioeconomic  History   Marital status: Single    Spouse name: Not on file   Number of children: Not on file   Years of education: Not on file   Highest education level: Not on file  Occupational History   Not on file  Tobacco Use   Smoking status: Former    Years: 10.00    Types: Cigarettes    Quit date: 2017    Years since quitting: 6.6   Smokeless tobacco: Never  Vaping Use   Vaping Use: Former   Substances: CBD   Devices: vapeppresso  Substance and Sexual Activity   Alcohol use: Not Currently    Comment: occasional   Drug use: Yes    Types: Marijuana    Comment: 03-30-2020 per pt smokes average 2-3g per week for  neuropathy   Sexual activity: Not on file  Other Topics Concern   Not on file  Social History Narrative   Not on file   Social Determinants of Health   Financial Resource Strain: Not on file  Food Insecurity: Not on file  Transportation Needs: Not on file  Physical Activity: Not on file  Stress: Not on file  Social Connections: Not on file     Family History: The patient's family history includes Diabetes in his mother; Healthy in his father.  ROS:   Please see the history of present illness.     All other systems reviewed and are negative.  EKGs/Labs/Other Studies Reviewed:    The following studies were reviewed today:   EKG:   07/01/2022: Normal sinus rhythm, rate 85, nonspecific T wave flattening  Recent Labs: 08/24/2021: ALT 32; BUN 13; Creatinine, Ser 1.18; Potassium 5.2; Sodium 140  Recent Lipid Panel    Component Value Date/Time   CHOL 235 (H) 12/18/2020 0444   TRIG 99 12/18/2020 0444   HDL 50 12/18/2020 0444   CHOLHDL 4.7 12/18/2020 0444   VLDL 20 12/18/2020 0444   LDLCALC 165 (H) 12/18/2020 0444    Physical Exam:    VS:  BP (!) 150/100   Pulse 85   Ht 6\' 2"  (1.88 m)   Wt 273 lb 3.2 oz (123.9 kg)   SpO2 94%   BMI 35.08 kg/m     Wt Readings from Last 3 Encounters:  07/01/22 273 lb 3.2 oz (123.9 kg)  12/04/21 274 lb (124.3 kg)  10/22/21 284 lb (128.8 kg)     GEN:  Well nourished, well developed in no acute distress HEENT: Normal NECK: No JVD; No carotid bruits LYMPHATICS: No lymphadenopathy CARDIAC: RRR, no murmurs, rubs, gallops RESPIRATORY:  Clear to auscultation without rales, wheezing or rhonchi  ABDOMEN: Soft, non-tender, non-distended MUSCULOSKELETAL:  No edema; No deformity  SKIN: Warm and dry NEUROLOGIC:  Alert and oriented x 3 PSYCHIATRIC:  Normal affect   ASSESSMENT:    1. CAD in native artery   2. Essential hypertension   3. Hyperlipidemia, unspecified hyperlipidemia type   4. Pain of left lower extremity      PLAN:      CAD: He was admitted from 12/17/2020 through 12/18/2020 to [facility] with chest pain.  EKG unremarkable, negative troponins.  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  Echocardiogram showed normal biventricular function, no significant valvular disease. -Continue rosuvastatin 10 mg daily.  Check lipid panel  HLD: LDL 165 on 12/18/2020 with nonobstructive CAD on coronary CTA.  Started rosuvastatin 10 mg daily.  Check lipid panel.  Goal LDL less than 70.  HTN: On the full 20 mg daily and amlodipine 10 mg daily.  BP elevated in clinic today but reports he did not take his meds today.  Asked him to check BP twice daily for next week and call with results  Leg pain: had planned for ABIs, but he did not have done.  Has been walking a lot with new job and reports pain resolved.  No further testing recommended at this time.  RTC in 6 months   Medication Adjustments/Labs and Tests Ordered: Current medicines are reviewed at length with the patient today.  Concerns regarding medicines are outlined above.  Orders Placed This Encounter  Procedures   Basic metabolic panel   Lipid panel   EKG 12-Lead    No orders of the defined types were placed in this encounter.    Patient Instructions  Medication Instructions:  Your physician recommends that you continue on your current medications as directed. Please refer to the Current Medication list given to you today.  *If you need a refill on your cardiac medications before your next appointment, please call your pharmacy*   Lab Work: Your physician recommends that you return for FASTING lab work for Lipid Panel and BMET.  If you have labs (blood work) drawn today and your tests are completely normal, you will receive your results only by: MyChart Message (if you have MyChart) OR A paper copy in the mail If you have any lab test that is abnormal or we need to change your treatment, we will call you to  review the results.  Follow-Up: At [facility], you and your health needs are our priority.  As part of our continuing mission to provide you with exceptional heart care, we have created designated Provider Care Teams.  These Care Teams include your primary Cardiologist (physician) and Advanced Practice Providers (APPs -  Physician Assistants and Nurse Practitioners) who all work together to provide you with the care you need, when you need it.  We recommend signing up for the patient portal called "MyChart".  Sign up information is provided on this After Visit Summary.  MyChart is used to connect with patients for Virtual Visits (Telemedicine).  Patients are able to view lab/test results, encounter notes, upcoming appointments, etc.  Non-urgent messages can be sent to your provider as well.   To learn more about what you can do with MyChart, go to [email or web address].    Your next appointment:   6 month(s)  The format for your next appointment:   In Person  Provider:   Dr. Bjorn Pippin  Other Instructions: Your physician has requested that you regularly monitor and record your blood pressure readings at home for 1 week then sent to MyChart for review.     Signed, [name], MD  07/01/2022 4:53 PM    [facility]

## 2022-07-01 NOTE — Patient Instructions (Addendum)
Medication Instructions:  Your physician recommends that you continue on your current medications as directed. Please refer to the Current Medication list given to you today.  *If you need a refill on your cardiac medications before your next appointment, please call your pharmacy*   Lab Work: Your physician recommends that you return for FASTING lab work for Lipid Panel and BMET.  If you have labs (blood work) drawn today and your tests are completely normal, you will receive your results only by: MyChart Message (if you have MyChart) OR A paper copy in the mail If you have any lab test that is abnormal or we need to change your treatment, we will call you to review the results.  Follow-Up: At [facility], you and your health needs are our priority.  As part of our continuing mission to provide you with exceptional heart care, we have created designated Provider Care Teams.  These Care Teams include your primary Cardiologist (physician) and Advanced Practice Providers (APPs -  Physician Assistants and Nurse Practitioners) who all work together to provide you with the care you need, when you need it.  We recommend signing up for the patient portal called "MyChart".  Sign up information is provided on this After Visit Summary.  MyChart is used to connect with patients for Virtual Visits (Telemedicine).  Patients are able to view lab/test results, encounter notes, upcoming appointments, etc.  Non-urgent messages can be sent to your provider as well.   To learn more about what you can do with MyChart, go to [email or web address].    Your next appointment:   6 month(s)  The format for your next appointment:   In Person  Provider:   Dr. Bjorn Pippin  Other Instructions: Your physician has requested that you regularly monitor and record your blood pressure readings at home for 1 week then sent to MyChart for review.

## 2022-09-02 ENCOUNTER — Encounter (HOSPITAL_COMMUNITY): Payer: Self-pay | Admitting: Emergency Medicine

## 2022-09-02 ENCOUNTER — Ambulatory Visit (HOSPITAL_COMMUNITY)
Admission: EM | Admit: 2022-09-02 | Discharge: 2022-09-02 | Disposition: A | Discharge status: Home / Self Care | Payer: 59 | Attending: Internal Medicine | Admitting: Internal Medicine

## 2022-09-02 DIAGNOSIS — J069 Acute upper respiratory infection, unspecified: Secondary | ICD-10-CM | POA: Diagnosis present

## 2022-09-02 DIAGNOSIS — U071 COVID-19: Secondary | ICD-10-CM | POA: Diagnosis not present

## 2022-09-02 LAB — RESP PANEL BY RT-PCR (FLU A&B, COVID) ARPGX2
Influenza A by PCR: NEGATIVE
Influenza B by PCR: NEGATIVE
SARS Coronavirus 2 by RT PCR: POSITIVE — AB

## 2022-09-02 LAB — BASIC METABOLIC PANEL
Anion gap: 14 (ref 5–15)
BUN: 10 mg/dL (ref 6–20)
CO2: 24 mmol/L (ref 22–32)
Calcium: 9.4 mg/dL (ref 8.9–10.3)
Chloride: 96 mmol/L — ABNORMAL LOW (ref 98–111)
Creatinine, Ser: 1.18 mg/dL (ref 0.61–1.24)
GFR, Estimated: >60 mL/min (ref >60)
Glucose, Bld: 102 mg/dL — ABNORMAL HIGH (ref 70–99)
Potassium: 3.7 mmol/L (ref 3.5–5.1)
Sodium: 134 mmol/L — ABNORMAL LOW (ref 135–145)

## 2022-09-02 MED ORDER — KETOROLAC TROMETHAMINE 30 MG/ML IJ SOLN
30.0000 mg | Freq: Once | INTRAMUSCULAR | Status: AC
  Administered 2022-09-02: 30 mg via INTRAMUSCULAR

## 2022-09-02 MED ORDER — GUAIFENESIN ER 1200 MG PO TB12: 1200.0000 mg | ORAL_TABLET | Freq: Two times a day (BID) | ORAL | 0 refills | Status: DC

## 2022-09-02 MED ORDER — KETOROLAC TROMETHAMINE 30 MG/ML IJ SOLN
INTRAMUSCULAR | Status: AC
  Filled 2022-09-02: qty 1

## 2022-09-02 MED ORDER — BENZONATATE 100 MG PO CAPS: 100.0000 mg | ORAL_CAPSULE | Freq: Three times a day (TID) | ORAL | 0 refills | Status: DC

## 2022-09-02 MED ORDER — PROMETHAZINE-DM 6.25-15 MG/5ML PO SYRP: 5.0000 mL | ORAL_SOLUTION | Freq: Every evening | ORAL | 0 refills | Status: DC | PRN

## 2022-09-02 MED ORDER — ACETAMINOPHEN 325 MG PO TABS
ORAL_TABLET | ORAL | Status: AC
  Filled 2022-09-02: qty 3

## 2022-09-02 MED ORDER — ACETAMINOPHEN 325 MG PO TABS
975.0000 mg | ORAL_TABLET | Freq: Once | ORAL | Status: AC
  Administered 2022-09-02: 975 mg via ORAL

## 2022-09-02 NOTE — Discharge Instructions (Signed)
You have a viral upper respiratory infection.  COVID-19 and flu testing is pending.  We will call you if either these are positive.  If your COVID-19 test is positive, you must stay home until day 6 of symptoms.  After day 6, you may go back out into the public but you must wear a mask.  Work note is at the end your packet.  Take guaifenesin 1200mg   2 times daily to thin your mucous so that you can cough it up and blow it out of your nose easier.   Tessalon pearles every 8 hours as needed for cough.  Take Promethazine DM cough medication to help with your cough at nighttime so that you are able to sleep. Do not drive, drink alcohol, or go to work while taking this medication since it can make you sleepy. Only take this at nighttime.   You may take tylenol 1,000mg  and ibuprofen 600mg  every 6 hours with food as needed for fever/chills, sore throat, aches/pains, and inflammation associated with viral illness. Take this with food to avoid stomach upset.   Do not take any ibuprofen for 24 hours due to ketorolac injection in clinic.  If you develop any new or worsening symptoms, please return.  If your symptoms are severe, please go to the emergency room.  Follow-up with your primary care provider for further evaluation and management of your symptoms as well as ongoing wellness visits.  I hope you feel better!

## 2022-09-02 NOTE — ED Triage Notes (Signed)
Pt having productive cough, body aches, congestion, headache that started yesterday. Pain in ribs to back.

## 2022-09-02 NOTE — ED Provider Notes (Signed)
[facility]    CSN: [id number] Arrival date & time: 09/02/22  8182      History   Chief Complaint Chief Complaint  Patient presents with   Cough   Generalized Body Aches    HPI Ernest Williams is a 51 y.o. male.   Patient presents to urgent care for evaluation of sore throat, nasal congestion, body aches, fever/chills, headache, cough, fatigue that started yesterday. Cough is productive with clear/white sputum and worse at nighttime. Nasal congestion is thick.  Sore throat is worsened with swallowing. Headache is generalized and is currently a 3 on a scale of 0-10.  Denies vision changes and dizziness. Reports chills but unknown highest temp at home. Denies shortness of breath, chest pain, nausea, vomiting, abdominal pain, diarrhea, and eye drainage.  No known sick contacts.  Denies history of asthma or chronic respiratory problems.  Patient is not a smoker and denies drug use.They are vaccinated against COVID-19 and they have not received their seasonal flu vaccine this year.  Has not attempted use of any over-the-counter medications prior to arrival urgent care for symptoms.     Cough   Past Medical History:  Diagnosis Date   Degeneration of spine    Family history of adverse reaction to anesthesia    mother--- ponv   History of concussion    09/ 2013  no loc,  no residual   Hypertension    goes to urgent care , no regular pcp,  (stress echo 12-21-2009 in epic, normal)   Peripheral neuropathy    per pt states told due to degenerative spine   Wears glasses    Wrist fracture, closed, left, initial encounter 03/25/2020    Patient Active Problem List   Diagnosis Date Noted   Diabetes mellitus (HCC) 12/04/2021   Decreased libido 12/04/2021   Low back pain 12/04/2021   HLD (hyperlipidemia) 12/18/2020   CAD (coronary artery disease) 12/18/2020   HTN (hypertension) 12/18/2020   Chest pain 12/17/2020   Sciatica 12/25/2015    Past Surgical History:  Procedure  Laterality Date   NO PAST SURGERIES     ORIF WRIST FRACTURE Left 03/31/2020   Procedure: OPEN REDUCTION INTERNAL FIXATION (ORIF) WRIST FRACTURE;  Surgeon: Sheral Apley, MD;  Location: [facility];  Service: Orthopedics;  Laterality: Left;       Home Medications    Prior to Admission medications   Medication Sig Start Date End Date Taking? Authorizing Provider  benzonatate (TESSALON) 100 MG capsule Take 1 capsule (100 mg total) by mouth every 8 (eight) hours. 09/02/22  Yes Carlisle Beers, FNP  Guaifenesin 1200 MG TB12 Take 1 tablet (1,200 mg total) by mouth in the morning and at bedtime. 09/02/22  Yes Carlisle Beers, FNP  promethazine-dextromethorphan (PROMETHAZINE-DM) 6.25-15 MG/5ML syrup Take 5 mLs by mouth at bedtime as needed for cough. 09/02/22  Yes Carlisle Beers, FNP  amLODipine (NORVASC) 10 MG tablet Take 1 tablet (10 mg total) by mouth daily. 07/20/21   [name], MD  Ascorbic Acid (VITAMIN C PO) Take by mouth daily.    [provider]  Capsaicin 0.1 % CREA Apply 1 application topically 4 (four) times daily as needed. 03/08/21   Domenick Gong, MD  Cholecalciferol (VITAMIN D3 PO) Take 1 tablet by mouth daily.    [provider]  Cyanocobalamin (VITAMIN B-12 PO) Take 1 tablet by mouth daily.    [provider]  cyclobenzaprine (FLEXERIL) 10 MG tablet Take 1 tablet (  10 mg total) by mouth 2 (two) times daily as needed for muscle spasms. 10/22/21   Bayard Hugger, NP  diphenhydrAMINE (BENADRYL) 25 MG tablet Take 25 mg by mouth daily as needed.    [provider]  gabapentin (NEURONTIN) 800 MG tablet Take 1 tablet (800 mg total) by mouth 3 (three) times daily. 10/22/21   Bayard Hugger, NP  L-ARGININE PO Take 1 tablet by mouth daily.    [provider]  lisinopril (ZESTRIL) 20 MG tablet Take 1 tablet (20 mg total) by mouth daily. 07/20/21   Donato Heinz, MD  rosuvastatin  (CRESTOR) 10 MG tablet Take 1 tablet (10 mg total) by mouth daily. 07/20/21   Donato Heinz, MD  tamsulosin (FLOMAX) 0.4 MG CAPS capsule Take 1 capsule (0.4 mg total) by mouth daily after supper. 08/20/21   Pearson Forster, NP    Family History Family History  Problem Relation Age of Onset   Diabetes Mother    Healthy Father     Social History Social History   Tobacco Use   Smoking status: Former    Years: 10.00    Types: Cigarettes    Quit date: 2017    Years since quitting: 6.8   Smokeless tobacco: Never  Vaping Use   Vaping Use: Former   Substances: CBD   Devices: vapeppresso  Substance Use Topics   Alcohol use: Not Currently    Comment: occasional   Drug use: Yes    Types: Marijuana    Comment: 03-30-2020 per pt smokes average 2-3g per week for neuropathy     Allergies   Fish allergy, Tomato, Pear, and Strawberry (diagnostic)   Review of Systems Review of Systems  Respiratory:  Positive for cough.   Per HPI   Physical Exam Triage Vital Signs ED Triage Vitals [09/02/22 0854]  Enc Vitals Group     BP (!) 145/100     Pulse Rate (!) 103     Resp 20     Temp 99.6 F (37.6 C)     Temp Source Oral     SpO2 98 %     Weight      Height      Head Circumference      Peak Flow      Pain Score 8     Pain Loc      Pain Edu?      Excl. in [place]?    No data found.  Updated Vital Signs BP (!) 145/100 (BP Location: Right Arm)   Pulse (!) 103   Temp 99.6 F (37.6 C) (Oral)   Resp 20   SpO2 98%   Visual Acuity Right Eye Distance:   Left Eye Distance:   Bilateral Distance:    Right Eye Near:   Left Eye Near:    Bilateral Near:     Physical Exam Vitals and nursing note reviewed.  Constitutional:      Appearance: He is obese. He is ill-appearing. He is not toxic-appearing.  HENT:     Head: Normocephalic and atraumatic.     Right Ear: Hearing, tympanic membrane, ear canal and external ear normal.     Left Ear: Hearing, tympanic membrane, ear  canal and external ear normal.     Nose: Congestion and rhinorrhea present.     Mouth/Throat:     Lips: Pink.     Mouth: Mucous membranes are moist.     Pharynx: Posterior oropharyngeal erythema present.  Comments: Small amount of clear postnasal drainage visualized to the posterior oropharynx.  Eyes:     General: Lids are normal. Vision grossly intact. Gaze aligned appropriately.        Right eye: No discharge.        Left eye: No discharge.     Extraocular Movements: Extraocular movements intact.     Conjunctiva/sclera: Conjunctivae normal.     Pupils: Pupils are equal, round, and reactive to light.  Cardiovascular:     Rate and Rhythm: Normal rate and regular rhythm.     Heart sounds: Normal heart sounds, S1 normal and S2 normal.  Pulmonary:     Effort: Pulmonary effort is normal. No respiratory distress.     Breath sounds: Normal breath sounds and air entry.     Comments: Cough elicited with deep inspiration during lung exam.  Lungs are clear to auscultation without respiratory distress. Musculoskeletal:     Cervical back: Neck supple.     Right lower leg: No edema.     Left lower leg: No edema.  Lymphadenopathy:     Cervical: Cervical adenopathy present.  Skin:    General: Skin is warm and dry.     Capillary Refill: Capillary refill takes less than 2 seconds.     Findings: No rash.  Neurological:     General: No focal deficit present.     Mental Status: He is alert and oriented to person, place, and time. Mental status is at baseline.     Cranial Nerves: No dysarthria or facial asymmetry.     Motor: No weakness.     Gait: Gait normal.  Psychiatric:        Mood and Affect: Mood normal.        Speech: Speech normal.        Behavior: Behavior normal.        Thought Content: Thought content normal.        Judgment: Judgment normal.      UC Treatments / Results  Labs (all labs ordered are listed, but only abnormal results are displayed) Labs Reviewed  RESP PANEL  BY RT-PCR (FLU A&B, COVID) [id number]  BASIC METABOLIC PANEL    EKG   Radiology No results found.  Procedures Procedures (including critical care time)  Medications Ordered in UC Medications  ketorolac (TORADOL) 30 MG/ML injection 30 mg (30 mg Intramuscular Given 09/02/22 0942)  acetaminophen (TYLENOL) tablet 975 mg (975 mg Oral Given 09/02/22 0941)    Initial Impression / Assessment and Plan / UC Course  I have reviewed the triage vital signs and the nursing notes.  Pertinent labs & imaging results that were available during my care of the patient were reviewed by me and considered in my medical decision making (see chart for details).  Viral URI with cough Symptoms and physical exam consistent with a viral upper respiratory tract infection that will likely resolve with rest, fluids, and prescriptions for symptomatic relief. No indication for imaging today based on stable cardiopulmonary exam and hemodynamically stable vital signs.  COVID-19 and flu testing is pending.  We will call patient if this is positive.  Quarantine guidelines discussed. Currently on day 2 of symptoms and does qualify for antiviral therapy.  BMP drawn to assess for kidney function as patient does not have a recent BUN/creatinine on file in case of need for Paxlovid prescription if COVID-positive.  Patient given ketorolac 30 mg injection IM and Tylenol in clinic today for body aches, fever/chills, and headache.  Guaifenesin, Tessalon Perles, and Promethazine DM sent to pharmacy for symptomatic relief to be taken as prescribed.   Promethazine DM cough medication may be used as needed only at bedtime due to possible drowsiness side effect (no alcohol, working, or driving while taking this advised).   May use ibuprofen/tylenol over the counter for body aches, fever/chills, and overall discomfort associated with viral illness.  No ibuprofen until tomorrow due to ketorolac injection in clinic today.  Nonpharmacologic  interventions for symptom relief provided and after visit summary below.   Strict ED/urgent care return precautions given.  Patient verbalizes understanding and agreement with plan.  Counseled patient regarding possible side effects and uses of all medications prescribed at today's visit.  Patient verbalizes understanding and agreement with plan.  All questions answered.  Patient discharged from urgent care in stable condition.         Final Clinical Impressions(s) / UC Diagnoses   Final diagnoses:  Viral URI with cough     Discharge Instructions      You have a viral upper respiratory infection.  COVID-19 and flu testing is pending.  We will call you if either these are positive.  If your COVID-19 test is positive, you must stay home until day 6 of symptoms.  After day 6, you may go back out into the public but you must wear a mask.  Work note is at the end your packet.  Take guaifenesin 1200mg   2 times daily to thin your mucous so that you can cough it up and blow it out of your nose easier.   Tessalon pearles every 8 hours as needed for cough.  Take Promethazine DM cough medication to help with your cough at nighttime so that you are able to sleep. Do not drive, drink alcohol, or go to work while taking this medication since it can make you sleepy. Only take this at nighttime.   You may take tylenol 1,000mg  and ibuprofen 600mg  every 6 hours with food as needed for fever/chills, sore throat, aches/pains, and inflammation associated with viral illness. Take this with food to avoid stomach upset.   Do not take any ibuprofen for 24 hours due to ketorolac injection in clinic.  If you develop any new or worsening symptoms, please return.  If your symptoms are severe, please go to the emergency room.  Follow-up with your primary care provider for further evaluation and management of your symptoms as well as ongoing wellness visits.  I hope you feel better!      ED Prescriptions      Medication Sig Dispense Auth. Provider   Guaifenesin 1200 MG TB12 Take 1 tablet (1,200 mg total) by mouth in the morning and at bedtime. 14 tablet M, FNP   benzonatate (TESSALON) 100 MG capsule Take 1 capsule (100 mg total) by mouth every 8 (eight) hours. 21 capsule Reita May, FNP   promethazine-dextromethorphan (PROMETHAZINE-DM) 6.25-15 MG/5ML syrup Take 5 mLs by mouth at bedtime as needed for cough. 118 mL Carlisle Beers, FNP      PDMP not reviewed this encounter.   01-13-1993 Tuckerman, Reita May 09/02/22 [phone]

## 2022-09-03 ENCOUNTER — Telehealth (HOSPITAL_COMMUNITY): Payer: Self-pay | Admitting: Emergency Medicine

## 2022-09-03 MED ORDER — NIRMATRELVIR/RITONAVIR (PAXLOVID)TABLET: 3.0000 | ORAL_TABLET | Freq: Two times a day (BID) | ORAL | 0 refills | Status: AC

## 2022-10-10 ENCOUNTER — Encounter: Payer: Self-pay | Admitting: Emergency Medicine

## 2022-10-10 ENCOUNTER — Ambulatory Visit: Payer: 59 | Admitting: Emergency Medicine

## 2022-10-10 ENCOUNTER — Other Ambulatory Visit: Payer: Self-pay | Admitting: Registered Nurse

## 2022-10-10 VITALS — BP 158/96 | HR 93 | Temp 98.9°F | Ht 74.0 in | Wt 273.4 lb

## 2022-10-10 DIAGNOSIS — I10 Essential (primary) hypertension: Secondary | ICD-10-CM | POA: Diagnosis not present

## 2022-10-10 DIAGNOSIS — Z7689 Persons encountering health services in other specified circumstances: Secondary | ICD-10-CM | POA: Diagnosis not present

## 2022-10-10 DIAGNOSIS — E785 Hyperlipidemia, unspecified: Secondary | ICD-10-CM

## 2022-10-10 DIAGNOSIS — I2510 Atherosclerotic heart disease of native coronary artery without angina pectoris: Secondary | ICD-10-CM | POA: Diagnosis not present

## 2022-10-10 DIAGNOSIS — E119 Type 2 diabetes mellitus without complications: Secondary | ICD-10-CM | POA: Diagnosis not present

## 2022-10-10 LAB — CBC WITH DIFFERENTIAL/PLATELET
Basophils Absolute: 0 10*3/uL (ref 0.0–0.1)
Basophils Relative: 0.4 % (ref 0.0–3.0)
Eosinophils Absolute: 0.1 10*3/uL (ref 0.0–0.7)
Eosinophils Relative: 1.3 % (ref 0.0–5.0)
HCT: 43.7 % (ref 39.0–52.0)
Hemoglobin: 14.5 g/dL (ref 13.0–17.0)
Lymphocytes Relative: 23.8 % (ref 12.0–46.0)
Lymphs Abs: 1.9 10*3/uL (ref 0.7–4.0)
MCHC: 33.1 g/dL (ref 30.0–36.0)
MCV: 93.4 fl (ref 78.0–100.0)
Monocytes Absolute: 0.7 10*3/uL (ref 0.1–1.0)
Monocytes Relative: 8.7 % (ref 3.0–12.0)
Neutro Abs: 5.3 10*3/uL (ref 1.4–7.7)
Neutrophils Relative %: 65.8 % (ref 43.0–77.0)
Platelets: 271 10*3/uL (ref 150.0–400.0)
RBC: 4.68 Mil/uL (ref 4.22–5.81)
RDW: 14.4 % (ref 11.5–15.5)
WBC: 8.1 10*3/uL (ref 4.0–10.5)

## 2022-10-10 LAB — COMPREHENSIVE METABOLIC PANEL
ALT: 33 U/L (ref 0–53)
AST: 28 U/L (ref 0–37)
Albumin: 4.8 g/dL (ref 3.5–5.2)
Alkaline Phosphatase: 56 U/L (ref 39–117)
BUN: 20 mg/dL (ref 6–23)
CO2: 31 mEq/L (ref 19–32)
Calcium: 9.9 mg/dL (ref 8.4–10.5)
Chloride: 101 mEq/L (ref 96–112)
Creatinine, Ser: 1.21 mg/dL (ref 0.40–1.50)
GFR: 68.29 mL/min (ref >60.00)
Glucose, Bld: 109 mg/dL — ABNORMAL HIGH (ref 70–99)
Potassium: 4.8 mEq/L (ref 3.5–5.1)
Sodium: 139 mEq/L (ref 135–145)
Total Bilirubin: 0.5 mg/dL (ref 0.2–1.2)
Total Protein: 8.1 g/dL (ref 6.0–8.3)

## 2022-10-10 LAB — MICROALBUMIN / CREATININE URINE RATIO
Creatinine,U: 150.3 mg/dL
Microalb Creat Ratio: 0.5 mg/g (ref 0.0–30.0)
Microalb, Ur: <0.7 mg/dL (ref 0.0–1.9)

## 2022-10-10 LAB — LIPID PANEL
Cholesterol: 253 mg/dL — ABNORMAL HIGH (ref 0–200)
HDL: 59.6 mg/dL (ref >39.00)
LDL Cholesterol: 168 mg/dL — ABNORMAL HIGH (ref 0–99)
NonHDL: 193.06
Total CHOL/HDL Ratio: 4
Triglycerides: 125 mg/dL (ref 0.0–149.0)
VLDL: 25 mg/dL (ref 0.0–40.0)

## 2022-10-10 LAB — HEMOGLOBIN A1C: Hgb A1c MFr Bld: 6.8 % — ABNORMAL HIGH (ref 4.6–6.5)

## 2022-10-10 NOTE — Assessment & Plan Note (Signed)
Elevated blood pressure readings in the office.  States readings are normal at home.  Has missed some of his blood pressure doses recently. Continue lisinopril 20 mg daily and amlodipine 10 mg daily Advised to monitor blood pressure readings at home daily for the next several weeks and keep a log.  Advised to contact the office if persistently abnormal numbers.  May need to increase dose of lisinopril Cardiovascular risks associated with uncontrolled hypertension discussed. Dietary approaches to stop hypertension discussed Follow-up in 3 to 6 months.

## 2022-10-10 NOTE — Assessment & Plan Note (Signed)
Stable.  Lipid profile done today. Diet and nutrition discussed. Continue rosuvastatin 10 mg daily. The 10-year ASCVD risk score (Arnett DK, et al., 2019) is: 17.6%   Values used to calculate the score:     Age: 52 years     Sex: Male     Is Non-Hispanic African American: No     Diabetic: Yes     Tobacco smoker: No     Systolic Blood Pressure: 158 mmHg     Is BP treated: Yes     HDL Cholesterol: 50 mg/dL     Total Cholesterol: 235 mg/dL

## 2022-10-10 NOTE — Progress Notes (Signed)
Ernest Williams 51 y.o.   Chief Complaint  Patient presents with   New Patient (Initial Visit)    HISTORY OF PRESENT ILLNESS: This is a 51 y.o. male first visit to this office, here to establish care with me. Has the following chronic medical problems: 1.  Hypertension presently on amlodipine and lisinopril 2.  History of coronary artery disease.  Sees cardiologist on a regular basis 3.  Dyslipidemia on rosuvastatin 4.  History of diabetes, presently on no medications.  Diet controlled. Has no complaints today or any other medical concerns.  HPI   Prior to Admission medications   Medication Sig Start Date End Date Taking? Authorizing Provider  amLODipine (NORVASC) 10 MG tablet Take 1 tablet (10 mg total) by mouth daily. 07/20/21  Yes [name], MD  Ascorbic Acid (VITAMIN C PO) Take by mouth daily.   Yes [provider]  benzonatate (TESSALON) 100 MG capsule Take 1 capsule (100 mg total) by mouth every 8 (eight) hours. 09/02/22  Yes Carlisle Beers, FNP  Capsaicin 0.1 % CREA Apply 1 application topically 4 (four) times daily as needed. 03/08/21  Yes Domenick Gong, MD  Cholecalciferol (VITAMIN D3 PO) Take 1 tablet by mouth daily.   Yes [provider]  Cyanocobalamin (VITAMIN B-12 PO) Take 1 tablet by mouth daily.   Yes [provider]  cyclobenzaprine (FLEXERIL) 10 MG tablet Take 1 tablet (10 mg total) by mouth 2 (two) times daily as needed for muscle spasms. 10/22/21  Yes Jones Bales, NP  diphenhydrAMINE (BENADRYL) 25 MG tablet Take 25 mg by mouth daily as needed.   Yes [provider]  gabapentin (NEURONTIN) 800 MG tablet Take 1 tablet (800 mg total) by mouth 3 (three) times daily. 10/22/21  Yes Jones Bales, NP  Guaifenesin 1200 MG TB12 Take 1 tablet (1,200 mg total) by mouth in the morning and at bedtime. 09/02/22  Yes Carlisle Beers, FNP  L-ARGININE PO Take 1 tablet by mouth daily.   Yes [provider]  lisinopril (ZESTRIL) 20 MG tablet Take 1 tablet (20 mg total) by mouth daily. 07/20/21  Yes [name], MD  promethazine-dextromethorphan (PROMETHAZINE-DM) 6.25-15 MG/5ML syrup Take 5 mLs by mouth at bedtime as needed for cough. 09/02/22  Yes Carlisle Beers, FNP  rosuvastatin (CRESTOR) 10 MG tablet Take 1 tablet (10 mg total) by mouth daily. 07/20/21  Yes [name], MD  tamsulosin (FLOMAX) 0.4 MG CAPS capsule Take 1 capsule (0.4 mg total) by mouth daily after supper. 08/20/21  Yes Ivette Loyal, NP    Allergies  Allergen Reactions   Fish Allergy Anaphylaxis   Tomato Anaphylaxis   Pear     Upset stomach   Strawberry (Diagnostic) Swelling    Swelling in mouth Upset stomach    Patient Active Problem List   Diagnosis Date Noted   Diabetes mellitus (HCC) 12/04/2021   Dyslipidemia 12/18/2020   CAD (coronary artery disease) 12/18/2020   HTN (hypertension) 12/18/2020   Sciatica 12/25/2015    Past Medical History:  Diagnosis Date   Degeneration of spine    Family history of adverse reaction to anesthesia    mother--- ponv   History of concussion    09/ 2013  no loc,  no residual   Hypertension    goes to urgent care , no regular pcp,  (stress echo 12-21-2009 in epic, normal)   Peripheral neuropathy    per pt states told due to degenerative spine  Wears glasses    Wrist fracture, closed, left, initial encounter 03/25/2020    Past Surgical History:  Procedure Laterality Date   NO PAST SURGERIES     ORIF WRIST FRACTURE Left 03/31/2020   Procedure: OPEN REDUCTION INTERNAL FIXATION (ORIF) WRIST FRACTURE;  Surgeon: Sheral ApleyMurphy, Timothy D, MD;  [facility];  Service: Orthopedics;  Laterality: Left;    Social History   Socioeconomic History   Marital status: Single    Spouse name: Not on file   Number of children: Not on file   Years of education: Not on file   Highest education level: Not on file  Occupational History    Not on file  Tobacco Use   Smoking status: Former    Years: 10.00    Types: Cigarettes    Quit date: 2017    Years since quitting: 6.9   Smokeless tobacco: Never  Vaping Use   Vaping Use: Former   Substances: CBD   Devices: vapeppresso  Substance and Sexual Activity   Alcohol use: Not Currently    Comment: occasional   Drug use: Yes    Types: Marijuana    Comment: 03-30-2020 per pt smokes average 2-3g per week for neuropathy   Sexual activity: Not on file  Other Topics Concern   Not on file  Social History Narrative   Not on file   Social Determinants of Health   Financial Resource Strain: Not on file  Food Insecurity: Not on file  Transportation Needs: Not on file  Physical Activity: Not on file  Stress: Not on file  Social Connections: Not on file  Intimate Partner Violence: Not on file    Family History  Problem Relation Age of Onset   Diabetes Mother    Healthy Father      Review of Systems  Constitutional: Negative.  Negative for chills and fever.  HENT: Negative.  Negative for congestion and sore throat.   Respiratory: Negative.  Negative for cough and shortness of breath.   Cardiovascular: Negative.  Negative for chest pain and palpitations.  Gastrointestinal:  Negative for abdominal pain, nausea and vomiting.  Genitourinary: Negative.   Skin: Negative.  Negative for rash.  Neurological: Negative.  Negative for dizziness and headaches.  All other systems reviewed and are negative.  Today's Vitals   10/10/22 1007  BP: (!) 158/96  Pulse: 93  Temp: 98.9 F (37.2 C)  TempSrc: Oral  SpO2: 93%  Weight: 273 lb 6 oz (124 kg)  Height: 6\' 2"  (1.88 m)   Body mass index is 35.1 kg/m.   Physical Exam Vitals reviewed.  Constitutional:      Appearance: Normal appearance.  HENT:     Head: Normocephalic.     Mouth/Throat:     Mouth: Mucous membranes are moist.     Pharynx: Oropharynx is clear.  Eyes:     Extraocular Movements: Extraocular movements  intact.     Pupils: Pupils are equal, round, and reactive to light.  Cardiovascular:     Rate and Rhythm: Normal rate and regular rhythm.     Pulses: Normal pulses.     Heart sounds: Normal heart sounds.  Pulmonary:     Effort: Pulmonary effort is normal.     Breath sounds: Normal breath sounds.  Abdominal:     Palpations: Abdomen is soft.     Tenderness: There is no abdominal tenderness.  Musculoskeletal:        General: Normal range of motion.  Cervical back: No tenderness.  Lymphadenopathy:     Cervical: No cervical adenopathy.  Skin:    General: Skin is warm and dry.  Neurological:     General: No focal deficit present.     Mental Status: He is alert and oriented to person, place, and time.  Psychiatric:        Mood and Affect: Mood normal.        Behavior: Behavior normal.      ASSESSMENT & PLAN: A total of 62 minutes was spent with the patient and counseling/coordination of care regarding preparing for this visit, review of available medical records, establishing care with me, comprehensive history and physical examination, review of multiple chronic medical problems and their management, review of all medications, diagnosis of hypertension and cardiovascular risks associated with this condition, education on nutrition, review of most recent blood work results and need for blood work today, prognosis, review of health maintenance items, documentation and need for follow-up in 3 to 6 months.  Problem List Items Addressed This Visit       Cardiovascular and Mediastinum   CAD (coronary artery disease)    Stable.  No recent anginal episodes. Sees cardiologist on a regular basis.      HTN (hypertension) - Primary    Elevated blood pressure readings in the office.  States readings are normal at home.  Has missed some of his blood pressure doses recently. Continue lisinopril 20 mg daily and amlodipine 10 mg daily Advised to monitor blood pressure readings at home daily  for the next several weeks and keep a log.  Advised to contact the office if persistently abnormal numbers.  May need to increase dose of lisinopril Cardiovascular risks associated with uncontrolled hypertension discussed. Dietary approaches to stop hypertension discussed Follow-up in 3 to 6 months.      Relevant Orders   CBC with Differential/Platelet   Comprehensive metabolic panel     Endocrine   Diabetes mellitus (HCC)    Diet controlled.  Presently on no medications. Last hemoglobin A1c at 6.6.  Repeated today.      Relevant Orders   Hemoglobin A1c   Urine Microalbumin w/creat. ratio     Other   Dyslipidemia    Stable.  Lipid profile done today. Diet and nutrition discussed. Continue rosuvastatin 10 mg daily. The 10-year ASCVD risk score (Arnett DK, et al., 2019) is: 17.6%   Values used to calculate the score:     Age: 62 years     Sex: Male     Is Non-Hispanic African American: No     Diabetic: Yes     Tobacco smoker: No     Systolic Blood Pressure: 158 mmHg     Is BP treated: Yes     HDL Cholesterol: 50 mg/dL     Total Cholesterol: 235 mg/dL       Relevant Orders   Lipid panel   Other Visit Diagnoses     Encounter to establish care          Patient Instructions  Health Maintenance, Male Adopting a healthy lifestyle and getting preventive care are important in promoting health and wellness. Ask your health care provider about: The right schedule for you to have regular tests and exams. Things you can do on your own to prevent diseases and keep yourself healthy. What should I know about diet, weight, and exercise? Eat a healthy diet  Eat a diet that includes plenty of vegetables, fruits, low-fat dairy products, and  lean protein. Do not eat a lot of foods that are high in solid fats, added sugars, or sodium. Maintain a healthy weight Body mass index (BMI) is a measurement that can be used to identify possible weight problems. It estimates body fat based  on height and weight. Your health care provider can help determine your BMI and help you achieve or maintain a healthy weight. Get regular exercise Get regular exercise. This is one of the most important things you can do for your health. Most adults should: Exercise for at least 150 minutes each week. The exercise should increase your heart rate and make you sweat (moderate-intensity exercise). Do strengthening exercises at least twice a week. This is in addition to the moderate-intensity exercise. Spend less time sitting. Even light physical activity can be beneficial. Watch cholesterol and blood lipids Have your blood tested for lipids and cholesterol at 51 years of age, then have this test every 5 years. You may need to have your cholesterol levels checked more often if: Your lipid or cholesterol levels are high. You are older than 53 years of age. You are at high risk for heart disease. What should I know about cancer screening? Many types of cancers can be detected early and may often be prevented. Depending on your health history and family history, you may need to have cancer screening at various ages. This may include screening for: Colorectal cancer. Prostate cancer. Skin cancer. Lung cancer. What should I know about heart disease, diabetes, and high blood pressure? Blood pressure and heart disease High blood pressure causes heart disease and increases the risk of stroke. This is more likely to develop in people who have high blood pressure readings or are overweight. Talk with your health care provider about your target blood pressure readings. Have your blood pressure checked: Every 3-5 years if you are 13-21 years of age. Every year if you are 72 years old or older. If you are between the ages of 40 and 31 and are a current or former smoker, ask your health care provider if you should have a one-time screening for abdominal aortic aneurysm (AAA). Diabetes Have regular diabetes  screenings. This checks your fasting blood sugar level. Have the screening done: Once every three years after age 34 if you are at a normal weight and have a low risk for diabetes. More often and at a younger age if you are overweight or have a high risk for diabetes. What should I know about preventing infection? Hepatitis B If you have a higher risk for hepatitis B, you should be screened for this virus. Talk with your health care provider to find out if you are at risk for hepatitis B infection. Hepatitis C Blood testing is recommended for: Everyone born from [birth year] through 1967. Anyone with known risk factors for hepatitis C. Sexually transmitted infections (STIs) You should be screened each year for STIs, including gonorrhea and chlamydia, if: You are sexually active and are younger than 53 years of age. You are older than 53 years of age and your health care provider tells you that you are at risk for this type of infection. Your sexual activity has changed since you were last screened, and you are at increased risk for chlamydia or gonorrhea. Ask your health care provider if you are at risk. Ask your health care provider about whether you are at high risk for HIV. Your health care provider may recommend a prescription medicine to help prevent HIV infection. If you  choose to take medicine to prevent HIV, you should first get tested for HIV. You should then be tested every 3 months for as long as you are taking the medicine. Follow these instructions at home: Alcohol use Do not drink alcohol if your health care provider tells you not to drink. If you drink alcohol: Limit how much you have to 0-2 drinks a day. Know how much alcohol is in your drink. In the [place] one drink equals one 12 oz bottle of beer (355 mL), one 5 oz glass of wine (148 mL), or one 1 oz glass of hard liquor (44 mL). Lifestyle Do not use any products that contain nicotine or tobacco. These products include cigarettes,  chewing tobacco, and vaping devices, such as e-cigarettes. If you need help quitting, ask your health care provider. Do not use street drugs. Do not share needles. Ask your health care provider for help if you need support or information about quitting drugs. General instructions Schedule regular health, dental, and eye exams. Stay current with your vaccines. Tell your health care provider if: You often feel depressed. You have ever been abused or do not feel safe at home. Summary Adopting a healthy lifestyle and getting preventive care are important in promoting health and wellness. Follow your health care provider's instructions about healthy diet, exercising, and getting tested or screened for diseases. Follow your health care provider's instructions on monitoring your cholesterol and blood pressure. This information is not intended to replace advice given to you by your health care provider. Make sure you discuss any questions you have with your health care provider. Document Revised: 03/19/2021 Document Reviewed: 03/19/2021 Elsevier Patient Education  2023 Elsevier Inc.    Edwina Barth, MD [facility]

## 2022-10-10 NOTE — Assessment & Plan Note (Signed)
Stable.  No recent anginal episodes. Sees cardiologist on a regular basis.

## 2022-10-10 NOTE — Patient Instructions (Signed)
Health Maintenance, Male Adopting a healthy lifestyle and getting preventive care are important in promoting health and wellness. Ask your health care provider about: The right schedule for you to have regular tests and exams. Things you can do on your own to prevent diseases and keep yourself healthy. What should I know about diet, weight, and exercise? Eat a healthy diet  Eat a diet that includes plenty of vegetables, fruits, low-fat dairy products, and lean protein. Do not eat a lot of foods that are high in solid fats, added sugars, or sodium. Maintain a healthy weight Body mass index (BMI) is a measurement that can be used to identify possible weight problems. It estimates body fat based on height and weight. Your health care provider can help determine your BMI and help you achieve or maintain a healthy weight. Get regular exercise Get regular exercise. This is one of the most important things you can do for your health. Most adults should: Exercise for at least 150 minutes each week. The exercise should increase your heart rate and make you sweat (moderate-intensity exercise). Do strengthening exercises at least twice a week. This is in addition to the moderate-intensity exercise. Spend less time sitting. Even light physical activity can be beneficial. Watch cholesterol and blood lipids Have your blood tested for lipids and cholesterol at 51 years of age, then have this test every 5 years. You may need to have your cholesterol levels checked more often if: Your lipid or cholesterol levels are high. You are older than 53 years of age. You are at high risk for heart disease. What should I know about cancer screening? Many types of cancers can be detected early and may often be prevented. Depending on your health history and family history, you may need to have cancer screening at various ages. This may include screening for: Colorectal cancer. Prostate cancer. Skin cancer. Lung  cancer. What should I know about heart disease, diabetes, and high blood pressure? Blood pressure and heart disease High blood pressure causes heart disease and increases the risk of stroke. This is more likely to develop in people who have high blood pressure readings or are overweight. Talk with your health care provider about your target blood pressure readings. Have your blood pressure checked: Every 3-5 years if you are 18-39 years of age. Every year if you are 40 years old or older. If you are between the ages of 65 and 75 and are a current or former smoker, ask your health care provider if you should have a one-time screening for abdominal aortic aneurysm (AAA). Diabetes Have regular diabetes screenings. This checks your fasting blood sugar level. Have the screening done: Once every three years after age 45 if you are at a normal weight and have a low risk for diabetes. More often and at a younger age if you are overweight or have a high risk for diabetes. What should I know about preventing infection? Hepatitis B If you have a higher risk for hepatitis B, you should be screened for this virus. Talk with your health care provider to find out if you are at risk for hepatitis B infection. Hepatitis C Blood testing is recommended for: Everyone born from 1945 through 1965. Anyone with known risk factors for hepatitis C. Sexually transmitted infections (STIs) You should be screened each year for STIs, including gonorrhea and chlamydia, if: You are sexually active and are younger than 53 years of age. You are older than 53 years of age and your   health care provider tells you that you are at risk for this type of infection. Your sexual activity has changed since you were last screened, and you are at increased risk for chlamydia or gonorrhea. Ask your health care provider if you are at risk. Ask your health care provider about whether you are at high risk for HIV. Your health care provider  may recommend a prescription medicine to help prevent HIV infection. If you choose to take medicine to prevent HIV, you should first get tested for HIV. You should then be tested every 3 months for as long as you are taking the medicine. Follow these instructions at home: Alcohol use Do not drink alcohol if your health care provider tells you not to drink. If you drink alcohol: Limit how much you have to 0-2 drinks a day. Know how much alcohol is in your drink. In the [place] one drink equals one 12 oz bottle of beer (355 mL), one 5 oz glass of wine (148 mL), or one 1 oz glass of hard liquor (44 mL). Lifestyle Do not use any products that contain nicotine or tobacco. These products include cigarettes, chewing tobacco, and vaping devices, such as e-cigarettes. If you need help quitting, ask your health care provider. Do not use street drugs. Do not share needles. Ask your health care provider for help if you need support or information about quitting drugs. General instructions Schedule regular health, dental, and eye exams. Stay current with your vaccines. Tell your health care provider if: You often feel depressed. You have ever been abused or do not feel safe at home. Summary Adopting a healthy lifestyle and getting preventive care are important in promoting health and wellness. Follow your health care provider's instructions about healthy diet, exercising, and getting tested or screened for diseases. Follow your health care provider's instructions on monitoring your cholesterol and blood pressure. This information is not intended to replace advice given to you by your health care provider. Make sure you discuss any questions you have with your health care provider. Document Revised: 03/19/2021 Document Reviewed: 03/19/2021 Elsevier Patient Education  2023 Elsevier Inc.  

## 2022-10-10 NOTE — Assessment & Plan Note (Signed)
Diet controlled.  Presently on no medications. Last hemoglobin A1c at 6.6.  Repeated today.

## 2022-10-11 NOTE — Telephone Encounter (Signed)
Okay to refill? 

## 2023-03-05 ENCOUNTER — Ambulatory Visit (INDEPENDENT_AMBULATORY_CARE_PROVIDER_SITE_OTHER): Payer: 59

## 2023-03-05 ENCOUNTER — Ambulatory Visit (HOSPITAL_COMMUNITY)
Admission: EM | Admit: 2023-03-05 | Discharge: 2023-03-05 | Disposition: A | Discharge status: Home / Self Care | Payer: 59 | Attending: Family Medicine | Admitting: Family Medicine

## 2023-03-05 ENCOUNTER — Encounter (HOSPITAL_COMMUNITY): Payer: Self-pay

## 2023-03-05 DIAGNOSIS — M542 Cervicalgia: Secondary | ICD-10-CM | POA: Diagnosis present

## 2023-03-05 DIAGNOSIS — J069 Acute upper respiratory infection, unspecified: Secondary | ICD-10-CM

## 2023-03-05 DIAGNOSIS — M62830 Muscle spasm of back: Secondary | ICD-10-CM

## 2023-03-05 DIAGNOSIS — Z1152 Encounter for screening for COVID-19: Secondary | ICD-10-CM | POA: Diagnosis not present

## 2023-03-05 LAB — POCT RAPID STREP A (OFFICE): Rapid Strep A Screen: NEGATIVE

## 2023-03-05 MED ORDER — BENZONATATE 200 MG PO CAPS: 200.0000 mg | ORAL_CAPSULE | Freq: Three times a day (TID) | ORAL | 0 refills | Status: DC | PRN

## 2023-03-05 MED ORDER — CYCLOBENZAPRINE HCL 10 MG PO TABS: 10.0000 mg | ORAL_TABLET | Freq: Two times a day (BID) | ORAL | 0 refills | Status: DC | PRN

## 2023-03-05 NOTE — Discharge Instructions (Signed)
You were seen today for upper respiratory symptoms.  Your strep test was negative.  Your chest xray was normal.  I have ordered a covid test which will be resulted tomorrow.  If positive you will be notified.  In the mean time this appears viral in nature.  I have sent out a medication to help with the cough.  I have also sent out a medication to help with muscle spasms.  I recommend you take motrin/tyelnol for any pain as well.  Please return if not improving as expected.

## 2023-03-05 NOTE — ED Triage Notes (Signed)
Patient having fever, sob, congestion, sore throat onset last night. States throat was closing. Started with headache and neck pain. No known sick exposure, Patient works for the city around a lot of people.

## 2023-03-05 NOTE — ED Provider Notes (Signed)
[facility]    CSN: [id number] Arrival date & time: 03/05/23  0802      History   Chief Complaint Chief Complaint  Patient presents with   Sore Throat    HPI Ernest Williams is a 52 y.o. male.   Patient is here for URI symptoms that started last night.  Cough, congestion, sore throat.  Also with headache and neck pain.  Some spasms in his back.  He felt feverish, sweating off/on last night.  Then chills.  No otc meds taken.  Unsure of any sick contacts.        Past Medical History:  Diagnosis Date   Degeneration of spine    Family history of adverse reaction to anesthesia    mother--- ponv   History of concussion    09/ 2013  no loc,  no residual   Hypertension    goes to urgent care , no regular pcp,  (stress echo 12-21-2009 in epic, normal)   Peripheral neuropathy    per pt states told due to degenerative spine   Wears glasses    Wrist fracture, closed, left, initial encounter 03/25/2020    Patient Active Problem List   Diagnosis Date Noted   Diabetes mellitus 12/04/2021   Dyslipidemia 12/18/2020   CAD (coronary artery disease) 12/18/2020   HTN (hypertension) 12/18/2020   Sciatica 12/25/2015    Past Surgical History:  Procedure Laterality Date   NO PAST SURGERIES     ORIF WRIST FRACTURE Left 03/31/2020   Procedure: OPEN REDUCTION INTERNAL FIXATION (ORIF) WRIST FRACTURE;  Surgeon: Sheral Apley, MD;  Location: [facility];  Service: Orthopedics;  Laterality: Left;       Home Medications    Prior to Admission medications   Medication Sig Start Date End Date Taking? Authorizing Provider  amLODipine (NORVASC) 10 MG tablet Take 1 tablet (10 mg total) by mouth daily. 07/20/21  Yes [name], MD  Ascorbic Acid (VITAMIN C PO) Take by mouth daily.   Yes [provider]  aspirin EC 81 MG tablet Take 81 mg by mouth daily. 12/19/20  Yes [provider]  Cholecalciferol (VITAMIN D3 PO) Take 1 tablet by  mouth daily.   Yes [provider]  Cyanocobalamin (VITAMIN B-12 PO) Take 1 tablet by mouth daily.   Yes [provider]  diphenhydrAMINE (BENADRYL) 25 MG tablet Take 25 mg by mouth daily as needed.   Yes [provider]  rosuvastatin (CRESTOR) 10 MG tablet Take 1 tablet (10 mg total) by mouth daily. 07/20/21  Yes [name], MD  Testosterone 20.25 MG/ACT (1.62%) GEL SMARTSIG:2 Pump Topical Every Morning 02/13/23  Yes [provider]  benzonatate (TESSALON) 100 MG capsule Take 1 capsule (100 mg total) by mouth every 8 (eight) hours. 09/02/22   Carlisle Beers, FNP  Capsaicin 0.1 % CREA Apply 1 application topically 4 (four) times daily as needed. 03/08/21   Domenick Gong, MD  cyclobenzaprine (FLEXERIL) 10 MG tablet Take 1 tablet (10 mg total) by mouth 2 (two) times daily as needed for muscle spasms. 10/22/21   Jones Bales, NP  gabapentin (NEURONTIN) 800 MG tablet Take 1 tablet (800 mg total) by mouth 3 (three) times daily. 10/22/21   Jones Bales, NP  Guaifenesin 1200 MG TB12 Take 1 tablet (1,200 mg total) by mouth in the morning and at bedtime. 09/02/22   Carlisle Beers, FNP  L-ARGININE PO Take 1 tablet by mouth daily.  [provider]  lisinopril (ZESTRIL) 20 MG tablet Take 1 tablet (20 mg total) by mouth daily. 07/20/21   [name], MD  promethazine-dextromethorphan (PROMETHAZINE-DM) 6.25-15 MG/5ML syrup Take 5 mLs by mouth at bedtime as needed for cough. 09/02/22   Carlisle Beers, FNP  tamsulosin (FLOMAX) 0.4 MG CAPS capsule Take 1 capsule (0.4 mg total) by mouth daily after supper. 08/20/21   Ivette Loyal, NP    Family History Family History  Problem Relation Age of Onset   Diabetes Mother    Healthy Father     Social History Social History   Tobacco Use   Smoking status: Former    Years: 10    Types: Cigarettes    Quit date: 2017    Years since quitting: 7.3   Smokeless  tobacco: Never  Vaping Use   Vaping Use: Former   Substances: CBD   Devices: vapeppresso  Substance Use Topics   Alcohol use: Not Currently    Comment: occasional   Drug use: Yes    Types: Marijuana    Comment: 03-30-2020 per pt smokes average 2-3g per week for neuropathy     Allergies   Fish allergy, Tomato, Pear, and Strawberry (diagnostic)   Review of Systems Review of Systems  Constitutional:  Positive for chills, fatigue and fever.  HENT:  Positive for congestion, rhinorrhea and sore throat.   Respiratory:  Positive for cough and shortness of breath.   Gastrointestinal: Negative.   Musculoskeletal:  Positive for myalgias.  Psychiatric/Behavioral: Negative.       Physical Exam Triage Vital Signs ED Triage Vitals  Enc Vitals Group     BP 03/05/23 0829 131/87     Pulse Rate 03/05/23 0829 91     Resp 03/05/23 0829 18     Temp 03/05/23 0829 98.9 F (37.2 C)     Temp Source 03/05/23 0829 Oral     SpO2 03/05/23 0829 96 %     Weight --      Height 03/05/23 0829  (1.88 m)     Head Circumference --      Peak Flow --      Pain Score 03/05/23 0826 9     Pain Loc --      Pain Edu? --      Excl. in GC? --    No data found.  Updated Vital Signs BP 131/87 (BP Location: Left Arm)   Pulse 91   Temp 98.9 F (37.2 C) (Oral)   Resp 18   Ht  (1.88 m)   SpO2 96%   BMI 35.10 kg/m   Visual Acuity Right Eye Distance:   Left Eye Distance:   Bilateral Distance:    Right Eye Near:   Left Eye Near:    Bilateral Near:     Physical Exam Constitutional:      General: He is not in acute distress.    Appearance: He is well-developed.  HENT:     Nose: Congestion and rhinorrhea present.     Mouth/Throat:     Pharynx: Posterior oropharyngeal erythema present. No pharyngeal swelling or oropharyngeal exudate.  Cardiovascular:     Rate and Rhythm: Normal rate and regular rhythm.     Heart sounds: Normal heart sounds.     Comments: Goes into coughing fits with  deep breaths Pulmonary:     Effort: Pulmonary effort is normal.  Musculoskeletal:     Cervical back: Normal range of motion and neck supple.  Lymphadenopathy:     Cervical: No cervical adenopathy.  Skin:    General: Skin is warm.  Neurological:     General: No focal deficit present.     Mental Status: He is alert.  Psychiatric:        Mood and Affect: Mood normal.      UC Treatments / Results  Labs (all labs ordered are listed, but only abnormal results are displayed) Labs Reviewed  CULTURE, GROUP A STREP [facility])  SARS CORONAVIRUS 2 (TAT 6-24 HRS)  POCT RAPID STREP A (OFFICE)    EKG   Radiology DG Chest 2 View  Result Date: 03/05/2023 CLINICAL DATA:  cough EXAM: CHEST - 2 VIEW COMPARISON:  Chest x-ray 12/17/2020 FINDINGS: The heart and mediastinal contours are within normal limits. No focal consolidation. No pulmonary edema. No pleural effusion. No pneumothorax. No acute osseous abnormality. IMPRESSION: No active cardiopulmonary disease. Electronically Signed   By: Tish Frederickson M.D.   On: 03/05/2023 09:00    Procedures Procedures (including critical care time)  Medications Ordered in UC Medications - No data to display  Initial Impression / Assessment and Plan / UC Course  I have reviewed the triage vital signs and the nursing notes.  Pertinent labs & imaging results that were available during my care of the patient were reviewed by me and considered in my medical decision making (see chart for details).  Final Clinical Impressions(s) / UC Diagnoses   Final diagnoses:  Upper respiratory tract infection, unspecified type  Back muscle spasm     Discharge Instructions      You were seen today for upper respiratory symptoms.  Your strep test was negative.  Your chest xray was normal.  I have ordered a covid test which will be resulted tomorrow.  If positive you will be notified.  In the mean time this appears viral in nature.  I have sent out a medication to  help with the cough.  I have also sent out a medication to help with muscle spasms.  I recommend you take motrin/tyelnol for any pain as well.  Please return if not improving as expected.     ED Prescriptions     Medication Sig Dispense Auth. Provider   benzonatate (TESSALON) 200 MG capsule Take 1 capsule (200 mg total) by mouth 3 (three) times daily as needed for cough. 21 capsule , , MD   cyclobenzaprine (FLEXERIL) 10 MG tablet Take 1 tablet (10 mg total) by mouth 2 (two) times daily as needed for muscle spasms. 20 tablet Jannifer Franklin, MD      PDMP not reviewed this encounter.   Jannifer Franklin, MD 03/05/23 [phone]

## 2023-03-06 LAB — SARS CORONAVIRUS 2 (TAT 6-24 HRS): SARS Coronavirus 2: NEGATIVE

## 2023-03-07 LAB — CULTURE, GROUP A STREP (THRC)

## 2023-03-08 LAB — CULTURE, GROUP A STREP (THRC)

## 2023-03-12 ENCOUNTER — Encounter (HOSPITAL_COMMUNITY): Payer: Self-pay

## 2023-03-12 ENCOUNTER — Ambulatory Visit (HOSPITAL_COMMUNITY)
Admission: EM | Admit: 2023-03-12 | Discharge: 2023-03-12 | Disposition: A | Discharge status: Home / Self Care | Payer: 59 | Attending: Internal Medicine | Admitting: Internal Medicine

## 2023-03-12 DIAGNOSIS — H60312 Diffuse otitis externa, left ear: Secondary | ICD-10-CM

## 2023-03-12 MED ORDER — CIPRO HC 0.2-1 % OT SUSP: 3.0000 [drp] | Freq: Two times a day (BID) | OTIC | 0 refills | Status: DC

## 2023-03-12 NOTE — ED Triage Notes (Signed)
Patient here today with c/o left ear pain after having sinus symptoms last week. His ear pain started on Friday. He has been having some dizziness. On Monday he used some drops to soften wax to see if that would help but did not help.

## 2023-03-12 NOTE — Discharge Instructions (Addendum)
Use ear drops 2x per day for 7 days It was nice to meet you today!

## 2023-03-12 NOTE — ED Provider Notes (Signed)
[facility]   [id number] 03/12/23 Arrival Time: 0801  ASSESSMENT & PLAN:  1. Acute diffuse otitis externa of left ear    -History and exam consistent with otitis externa.  I had the nurse perform an irrigation of the ear canal to increase the efficacy of eardrop treatment, which will treat with Cipro HC otic suspension 3 drops 2x times daily for 7 days.  Encouraged follow-up with PCP.  All questions answered and he agrees to plan.  Meds ordered this encounter  Medications   ciprofloxacin-hydrocortisone (CIPRO HC) OTIC suspension    Sig: Place 3 drops into the left ear 2 (two) times daily for 7 days.    Dispense:  10 mL    Refill:  0     Discharge Instructions      Use ear drops 2x per day for 7 days It was nice to meet you today!      Follow-up Information     Sagardia, Eilleen Kempf, MD.   Specialty: Internal Medicine Contact information: [address] [place] [address] [phone]                  Reviewed expectations re: course of current medical issues. Questions answered. Outlined signs and symptoms indicating need for more acute intervention. Patient verbalized understanding. After Visit Summary given.   SUBJECTIVE: Pleasant 51 year old male comes urgent care to be evaluated for left ear pain.  Started having upper respiratory/sinus symptoms last week.  Ear pain started on Friday.  Has been having some constant dull ache in the left ear.  Has noticed some clear fluid draining from it.  He has tried flushing it but has not been successful.  He has had some equilibrium and balance due to this.  Denies any fevers.  Still having occasional cough and sore throat.  No LMP for male patient. Past Surgical History:  Procedure Laterality Date   NO PAST SURGERIES     ORIF WRIST FRACTURE Left 03/31/2020   Procedure: OPEN REDUCTION INTERNAL FIXATION (ORIF) WRIST FRACTURE;  Surgeon: Sheral Apley, MD;  Location: [facility];   Service: Orthopedics;  Laterality: Left;     OBJECTIVE:  Vitals:   03/12/23 0829 03/12/23 0830  BP:  (!) 157/102  Pulse:  86  Resp:  16  Temp:  98.4 F (36.9 C)  TempSrc:  Oral  SpO2:  94%  Weight: 108.9 kg   Height: 6\' 2"  (1.88 m)      Physical Exam Vitals and nursing note reviewed.  Constitutional:      General: He is not in acute distress. HENT:     Head: Normocephalic.     Right Ear: Tympanic membrane, ear canal and external ear normal.     Left Ear: Drainage, swelling and tenderness present. There is impacted cerumen (white debris in canal).  Cardiovascular:     Rate and Rhythm: Normal rate.  Pulmonary:     Effort: Pulmonary effort is normal.  Musculoskeletal:        General: Normal range of motion.  Lymphadenopathy:     Cervical: Cervical adenopathy present.  Neurological:     General: No focal deficit present.  Psychiatric:        Mood and Affect: Mood normal.      Labs: Results for orders placed or performed during the hospital encounter of 03/05/23  Culture, group A strep (throat)   Specimen: Throat  Result Value Ref Range   Specimen Description THROAT    Special  Requests NONE    Culture      NO GROUP A STREP (S.PYOGENES) ISOLATED Performed at [facility], [address] [place], [place] [address]    Report Status 03/08/2023 FINAL   SARS CORONAVIRUS 2 (TAT 6-24 HRS) Anterior Nasal Swab   Specimen: Anterior Nasal Swab  Result Value Ref Range   SARS Coronavirus 2 NEGATIVE NEGATIVE  POC rapid strep A  Result Value Ref Range   Rapid Strep A Screen Negative Negative   Labs Reviewed - No data to display  Imaging: No results found.   Allergies  Allergen Reactions   Fish Allergy Anaphylaxis   Tomato Anaphylaxis   Pear     Upset stomach   Strawberry (Diagnostic) Swelling    Swelling in mouth Upset stomach                                               Past Medical History:  Diagnosis Date   Degeneration of spine    Family  history of adverse reaction to anesthesia    mother--- ponv   History of concussion    09/ 2013  no loc,  no residual   Hypertension    goes to urgent care , no regular pcp,  (stress echo 12-21-2009 in epic, normal)   Peripheral neuropathy    per pt states told due to degenerative spine   Wears glasses    Wrist fracture, closed, left, initial encounter 03/25/2020    Social History   Socioeconomic History   Marital status: Single    Spouse name: Not on file   Number of children: Not on file   Years of education: Not on file   Highest education level: Not on file  Occupational History   Not on file  Tobacco Use   Smoking status: Former    Years: 10    Types: Cigarettes    Quit date: 2017    Years since quitting: 7.3   Smokeless tobacco: Never  Vaping Use   Vaping Use: Former   Substances: CBD   Devices: vapeppresso  Substance and Sexual Activity   Alcohol use: Not Currently    Comment: occasional   Drug use: Yes    Types: Marijuana    Comment: 03-30-2020 per pt smokes average 2-3g per week for neuropathy   Sexual activity: Not on file  Other Topics Concern   Not on file  Social History Narrative   Not on file   Social Determinants of Health   Financial Resource Strain: Not on file  Food Insecurity: Not on file  Transportation Needs: Not on file  Physical Activity: Not on file  Stress: Not on file  Social Connections: Not on file  Intimate Partner Violence: Not on file    Family History  Problem Relation Age of Onset   Diabetes Mother    Healthy Father       , Baldemar Friday, MD 03/12/23 [phone]

## 2023-03-14 ENCOUNTER — Telehealth (HOSPITAL_COMMUNITY): Payer: Self-pay | Admitting: Urgent Care

## 2023-03-14 MED ORDER — CIPROFLOXACIN-DEXAMETHASONE 0.3-0.1 % OT SUSP: 4.0000 [drp] | Freq: Two times a day (BID) | OTIC | 0 refills | Status: DC

## 2023-03-14 NOTE — Telephone Encounter (Signed)
Patient states that no pharmacy carries Cipro HC.  Will switch to Ciprodex.  Maintain all other instructions.

## 2023-04-09 ENCOUNTER — Ambulatory Visit: Payer: 59 | Admitting: Emergency Medicine

## 2023-04-09 VITALS — BP 148/92 | HR 90 | Temp 98.7°F | Ht 74.0 in | Wt 262.5 lb

## 2023-04-09 DIAGNOSIS — I2510 Atherosclerotic heart disease of native coronary artery without angina pectoris: Secondary | ICD-10-CM | POA: Diagnosis not present

## 2023-04-09 DIAGNOSIS — E1169 Type 2 diabetes mellitus with other specified complication: Secondary | ICD-10-CM | POA: Diagnosis not present

## 2023-04-09 DIAGNOSIS — I152 Hypertension secondary to endocrine disorders: Secondary | ICD-10-CM

## 2023-04-09 DIAGNOSIS — E1159 Type 2 diabetes mellitus with other circulatory complications: Secondary | ICD-10-CM | POA: Diagnosis not present

## 2023-04-09 DIAGNOSIS — E785 Hyperlipidemia, unspecified: Secondary | ICD-10-CM

## 2023-04-09 LAB — POCT GLYCOSYLATED HEMOGLOBIN (HGB A1C): Hemoglobin A1C: 6.2 % — AB (ref 4.0–5.6)

## 2023-04-09 MED ORDER — VALSARTAN 80 MG PO TABS
80.0000 mg | ORAL_TABLET | Freq: Every day | ORAL | 3 refills | Status: DC
Start: 2023-04-09 — End: 2024-05-02

## 2023-04-09 MED ORDER — CYCLOBENZAPRINE HCL 10 MG PO TABS
10.0000 mg | ORAL_TABLET | Freq: Two times a day (BID) | ORAL | 0 refills | Status: DC | PRN
Start: 2023-04-09 — End: 2023-06-16

## 2023-04-09 NOTE — Progress Notes (Signed)
Ernest Williams 52 y.o.   Chief Complaint  Patient presents with   Medical Management of Chronic Issues    f/u appt, patient states that he has left ear issues,  sounds like "water" moving around in his ear.     HISTORY OF PRESENT ILLNESS: This is a 52 y.o. male here for 66-month follow-up of diabetes hypertension and dyslipidemia Eating better and exercising more Has history of hypogonadism.  Getting testosterone treatment from endocrinologist Overall feeling better. Blood pressure readings still elevated at home Colonoscopy done recently.  Report not available.  HPI   Prior to Admission medications   Medication Sig Start Date End Date Taking? Authorizing Provider  amLODipine (NORVASC) 10 MG tablet Take 1 tablet (10 mg total) by mouth daily. 07/20/21  Yes [name], MD  Ascorbic Acid (VITAMIN C PO) Take by mouth daily.   Yes [provider]  aspirin EC 81 MG tablet Take 81 mg by mouth daily. 12/19/20  Yes [provider]  benzonatate (TESSALON) 200 MG capsule Take 1 capsule (200 mg total) by mouth 3 (three) times daily as needed for cough. 03/05/23  Yes Piontek, Erin, MD  Cholecalciferol (VITAMIN D3 PO) Take 1 tablet by mouth daily.   Yes [provider]  ciprofloxacin-dexamethasone (CIPRODEX) OTIC suspension Place 4 drops into the left ear 2 (two) times daily. 03/14/23  Yes Wallis Bamberg, PA-C  Cyanocobalamin (VITAMIN B-12 PO) Take 1 tablet by mouth daily.   Yes [provider]  cyclobenzaprine (FLEXERIL) 10 MG tablet Take 1 tablet (10 mg total) by mouth 2 (two) times daily as needed for muscle spasms. 03/05/23  Yes Piontek, Erin, MD  diphenhydrAMINE (BENADRYL) 25 MG tablet Take 25 mg by mouth daily as needed.   Yes [provider]  rosuvastatin (CRESTOR) 10 MG tablet Take 1 tablet (10 mg total) by mouth daily. 07/20/21  Yes [name], MD  Testosterone 20.25 MG/ACT (1.62%) GEL SMARTSIG:2 Pump Topical Every Morning  02/13/23  Yes [provider]    Allergies  Allergen Reactions   Fish Allergy Anaphylaxis   Tomato Anaphylaxis   Pear     Upset stomach   Strawberry (Diagnostic) Swelling    Swelling in mouth Upset stomach    Patient Active Problem List   Diagnosis Date Noted   Dyslipidemia associated with type 2 diabetes mellitus (HCC) 12/04/2021   Dyslipidemia 12/18/2020   CAD (coronary artery disease) 12/18/2020   Hypertension associated with diabetes (HCC) 12/18/2020   Sciatica 12/25/2015    Past Medical History:  Diagnosis Date   Degeneration of spine    Family history of adverse reaction to anesthesia    mother--- ponv   History of concussion    09/ 2013  no loc,  no residual   Hypertension    goes to urgent care , no regular pcp,  (stress echo 12-21-2009 in epic, normal)   Peripheral neuropathy    per pt states told due to degenerative spine   Wears glasses    Wrist fracture, closed, left, initial encounter 03/25/2020    Past Surgical History:  Procedure Laterality Date   NO PAST SURGERIES     ORIF WRIST FRACTURE Left 03/31/2020   Procedure: OPEN REDUCTION INTERNAL FIXATION (ORIF) WRIST FRACTURE;  Surgeon: Sheral Apley, MD;  Location: [facility] [place];  Service: Orthopedics;  Laterality: Left;    Social History   Socioeconomic History   Marital status: Single    Spouse name: Not on file  Number of children: Not on file   Years of education: Not on file   Highest education level: Associate degree: occupational, technical, or vocational program  Occupational History   Not on file  Tobacco Use   Smoking status: Former    Years: 10    Types: Cigarettes    Quit date: 2017    Years since quitting: 7.4   Smokeless tobacco: Never  Vaping Use   Vaping Use: Former   Substances: CBD   Devices: vapeppresso  Substance and Sexual Activity   Alcohol use: Not Currently    Comment: occasional   Drug use: Yes    Types: Marijuana    Comment: 03-30-2020  per pt smokes average 2-3g per week for neuropathy   Sexual activity: Not on file  Other Topics Concern   Not on file  Social History Narrative   Not on file   Social Determinants of Health   Financial Resource Strain: Medium Risk (04/09/2023)   Overall Financial Resource Strain (CARDIA)    Difficulty of Paying Living Expenses: Somewhat hard  Food Insecurity: Unknown (04/09/2023)   Hunger Vital Sign    Worried About Running Out of Food in the Last Year: Patient declined    Ran Out of Food in the Last Year: Never true  Transportation Needs: No Transportation Needs (04/09/2023)   PRAPARE - Administrator, Civil Service (Medical): No    Lack of Transportation (Non-Medical): No  Physical Activity: Insufficiently Active (04/09/2023)   Exercise Vital Sign    Days of Exercise per Week: 3 days    Minutes of Exercise per Session: 30 min  Stress: Not on file  Social Connections: Unknown (04/09/2023)   Social Connection and Isolation Panel [NHANES]    Frequency of Communication with Friends and Family: Once a week    Frequency of Social Gatherings with Friends and Family: Once a week    Attends Religious Services: Patient declined    Database administrator or Organizations: No    Attends Engineer, structural: Not on file    Marital Status: Never married  Intimate Partner Violence: Not on file    Family History  Problem Relation Age of Onset   Diabetes Mother    Healthy Father      Review of Systems  Constitutional: Negative.  Negative for chills and fever.  HENT: Negative.  Negative for congestion and sore throat.   Respiratory: Negative.  Negative for cough and shortness of breath.   Cardiovascular: Negative.  Negative for chest pain and palpitations.  Gastrointestinal:  Negative for abdominal pain, diarrhea, nausea and vomiting.  Genitourinary: Negative.  Negative for dysuria and hematuria.  Skin: Negative.  Negative for rash.  Neurological: Negative.   Negative for dizziness and headaches.  All other systems reviewed and are negative.   Vitals:   04/09/23 1309  BP: (!) 148/92  Pulse: 90  Temp: 98.7 F (37.1 C)  SpO2: 97%    Physical Exam Vitals reviewed.  Constitutional:      Appearance: Normal appearance.  HENT:     Head: Normocephalic.     Right Ear: Tympanic membrane, ear canal and external ear normal.     Left Ear: Tympanic membrane, ear canal and external ear normal.     Mouth/Throat:     Mouth: Mucous membranes are moist.     Pharynx: Oropharynx is clear.  Eyes:     Extraocular Movements: Extraocular movements intact.     Conjunctiva/sclera: Conjunctivae normal.  Pupils: Pupils are equal, round, and reactive to light.  Cardiovascular:     Rate and Rhythm: Normal rate and regular rhythm.     Pulses: Normal pulses.     Heart sounds: Normal heart sounds.  Pulmonary:     Effort: Pulmonary effort is normal.     Breath sounds: Normal breath sounds.  Abdominal:     Palpations: Abdomen is soft.     Tenderness: There is no abdominal tenderness.  Musculoskeletal:     Cervical back: No tenderness.  Lymphadenopathy:     Cervical: No cervical adenopathy.  Skin:    General: Skin is warm and dry.     Capillary Refill: Capillary refill takes less than 2 seconds.  Neurological:     General: No focal deficit present.     Mental Status: He is alert and oriented to person, place, and time.  Psychiatric:        Mood and Affect: Mood normal.        Behavior: Behavior normal.    Results for orders placed or performed in visit on 04/09/23 (from the past 24 hour(s))  POCT glycosylated hemoglobin (Hb A1C)     Status: Abnormal   Collection Time: 04/09/23  1:22 PM  Result Value Ref Range   Hemoglobin A1C 6.2 (A) 4.0 - 5.6 %   HbA1c POC (<> result, manual entry)     HbA1c, POC (prediabetic range)     HbA1c, POC (controlled diabetic range)       ASSESSMENT & PLAN: A total of 45 minutes was spent with the patient and  counseling/coordination of care regarding preparing for this visit, review of most recent office visit notes, review of most recent blood work results, review of multiple chronic medical conditions under management, review of all medications and changes made, cardiovascular risks associated with uncontrolled hypertension, education on nutrition, review of health maintenance items, prognosis, documentation, and need for follow-up.  Problem List Items Addressed This Visit       Cardiovascular and Mediastinum   CAD (coronary artery disease)    Stable.  No recent anginal episodes Continues daily baby aspirin      Relevant Medications   valsartan (DIOVAN) 80 MG tablet   Hypertension associated with diabetes (HCC) - Primary    Elevated blood pressure reading in the office and at home Recommend to continue amlodipine 10 mg and start valsartan 80 mg daily Well-controlled diabetes off medications.  Hemoglobin A1c is 6.2 Cardiovascular risks associated with hypertension and diabetes discussed Diet and nutrition discussed.      Relevant Medications   cyclobenzaprine (FLEXERIL) 10 MG tablet   valsartan (DIOVAN) 80 MG tablet   Other Relevant Orders   POCT glycosylated hemoglobin (Hb A1C) (Completed)     Endocrine   Dyslipidemia associated with type 2 diabetes mellitus (HCC)    Exercising more and eating better Continue rosuvastatin 10 mg daily      Relevant Medications   valsartan (DIOVAN) 80 MG tablet   Other Relevant Orders   POCT glycosylated hemoglobin (Hb A1C) (Completed)   Patient Instructions  Hypertension, Adult High blood pressure (hypertension) is when the force of blood pumping through the arteries is too strong. The arteries are the blood vessels that carry blood from the heart throughout the body. Hypertension forces the heart to work harder to pump blood and may cause arteries to become narrow or stiff. Untreated or uncontrolled hypertension can lead to a heart attack, heart  failure, a stroke, kidney disease, and other  problems. A blood pressure reading consists of a higher number over a lower number. Ideally, your blood pressure should be below 120/80. The first ("top") number is called the systolic pressure. It is a measure of the pressure in your arteries as your heart beats. The second ("bottom") number is called the diastolic pressure. It is a measure of the pressure in your arteries as the heart relaxes. What are the causes? The exact cause of this condition is not known. There are some conditions that result in high blood pressure. What increases the risk? Certain factors may make you more likely to develop high blood pressure. Some of these risk factors are under your control, including: Smoking. Not getting enough exercise or physical activity. Being overweight. Having too much fat, sugar, calories, or salt (sodium) in your diet. Drinking too much alcohol. Other risk factors include: Having a personal history of heart disease, diabetes, high cholesterol, or kidney disease. Stress. Having a family history of high blood pressure and high cholesterol. Having obstructive sleep apnea. Age. The risk increases with age. What are the signs or symptoms? High blood pressure may not cause symptoms. Very high blood pressure (hypertensive crisis) may cause: Headache. Fast or irregular heartbeats (palpitations). Shortness of breath. Nosebleed. Nausea and vomiting. Vision changes. Severe chest pain, dizziness, and seizures. How is this diagnosed? This condition is diagnosed by measuring your blood pressure while you are seated, with your arm resting on a flat surface, your legs uncrossed, and your feet flat on the floor. The cuff of the blood pressure monitor will be placed directly against the skin of your upper arm at the level of your heart. Blood pressure should be measured at least twice using the same arm. Certain conditions can cause a difference in blood  pressure between your right and left arms. If you have a high blood pressure reading during one visit or you have normal blood pressure with other risk factors, you may be asked to: Return on a different day to have your blood pressure checked again. Monitor your blood pressure at home for 1 week or longer. If you are diagnosed with hypertension, you may have other blood or imaging tests to help your health care provider understand your overall risk for other conditions. How is this treated? This condition is treated by making healthy lifestyle changes, such as eating healthy foods, exercising more, and reducing your alcohol intake. You may be referred for counseling on a healthy diet and physical activity. Your health care provider may prescribe medicine if lifestyle changes are not enough to get your blood pressure under control and if: Your systolic blood pressure is above 130. Your diastolic blood pressure is above 80. Your personal target blood pressure may vary depending on your medical conditions, your age, and other factors. Follow these instructions at home: Eating and drinking  Eat a diet that is high in fiber and potassium, and low in sodium, added sugar, and fat. An example of this eating plan is called the DASH diet. DASH stands for Dietary Approaches to Stop Hypertension. To eat this way: Eat plenty of fresh fruits and vegetables. Try to fill one half of your plate at each meal with fruits and vegetables. Eat whole grains, such as whole-wheat pasta, brown rice, or whole-grain bread. Fill about one fourth of your plate with whole grains. Eat or drink low-fat dairy products, such as skim milk or low-fat yogurt. Avoid fatty cuts of meat, processed or cured meats, and poultry with skin. Fill about  one fourth of your plate with lean proteins, such as fish, chicken without skin, beans, eggs, or tofu. Avoid pre-made and processed foods. These tend to be higher in sodium, added sugar, and  fat. Reduce your daily sodium intake. Many people with hypertension should eat less than 1,500 mg of sodium a day. Do not drink alcohol if: Your health care provider tells you not to drink. You are pregnant, may be pregnant, or are planning to become pregnant. If you drink alcohol: Limit how much you have to: 0-1 drink a day for women. 0-2 drinks a day for men. Know how much alcohol is in your drink. In the [place] one drink equals one 12 oz bottle of beer (355 mL), one 5 oz glass of wine (148 mL), or one 1 oz glass of hard liquor (44 mL). Lifestyle  Work with your health care provider to maintain a healthy body weight or to lose weight. Ask what an ideal weight is for you. Get at least 30 minutes of exercise that causes your heart to beat faster (aerobic exercise) most days of the week. Activities may include walking, swimming, or biking. Include exercise to strengthen your muscles (resistance exercise), such as Pilates or lifting weights, as part of your weekly exercise routine. Try to do these types of exercises for 30 minutes at least 3 days a week. Do not use any products that contain nicotine or tobacco. These products include cigarettes, chewing tobacco, and vaping devices, such as e-cigarettes. If you need help quitting, ask your health care provider. Monitor your blood pressure at home as told by your health care provider. Keep all follow-up visits. This is important. Medicines Take over-the-counter and prescription medicines only as told by your health care provider. Follow directions carefully. Blood pressure medicines must be taken as prescribed. Do not skip doses of blood pressure medicine. Doing this puts you at risk for problems and can make the medicine less effective. Ask your health care provider about side effects or reactions to medicines that you should watch for. Contact a health care provider if you: Think you are having a reaction to a medicine you are taking. Have  headaches that keep coming back (recurring). Feel dizzy. Have swelling in your ankles. Have trouble with your vision. Get help right away if you: Develop a severe headache or confusion. Have unusual weakness or numbness. Feel faint. Have severe pain in your chest or abdomen. Vomit repeatedly. Have trouble breathing. These symptoms may be an emergency. Get help right away. Call 911. Do not wait to see if the symptoms will go away. Do not drive yourself to the hospital. Summary Hypertension is when the force of blood pumping through your arteries is too strong. If this condition is not controlled, it may put you at risk for serious complications. Your personal target blood pressure may vary depending on your medical conditions, your age, and other factors. For most people, a normal blood pressure is less than 120/80. Hypertension is treated with lifestyle changes, medicines, or a combination of both. Lifestyle changes include losing weight, eating a healthy, low-sodium diet, exercising more, and limiting alcohol. This information is not intended to replace advice given to you by your health care provider. Make sure you discuss any questions you have with your health care provider. Document Revised: 09/04/2021 Document Reviewed: 09/04/2021 Elsevier Patient Education  2024 [employer]     Edwina Barth, MD  Primary [facility] at [facility]

## 2023-04-09 NOTE — Assessment & Plan Note (Signed)
Stable.  No recent anginal episodes.  Continues daily baby aspirin 

## 2023-04-09 NOTE — Patient Instructions (Signed)
Hypertension, Adult High blood pressure (hypertension) is when the force of blood pumping through the arteries is too strong. The arteries are the blood vessels that carry blood from the heart throughout the body. Hypertension forces the heart to work harder to pump blood and may cause arteries to become narrow or stiff. Untreated or uncontrolled hypertension can lead to a heart attack, heart failure, a stroke, kidney disease, and other problems. A blood pressure reading consists of a higher number over a lower number. Ideally, your blood pressure should be below 120/80. The first ("top") number is called the systolic pressure. It is a measure of the pressure in your arteries as your heart beats. The second ("bottom") number is called the diastolic pressure. It is a measure of the pressure in your arteries as the heart relaxes. What are the causes? The exact cause of this condition is not known. There are some conditions that result in high blood pressure. What increases the risk? Certain factors may make you more likely to develop high blood pressure. Some of these risk factors are under your control, including: Smoking. Not getting enough exercise or physical activity. Being overweight. Having too much fat, sugar, calories, or salt (sodium) in your diet. Drinking too much alcohol. Other risk factors include: Having a personal history of heart disease, diabetes, high cholesterol, or kidney disease. Stress. Having a family history of high blood pressure and high cholesterol. Having obstructive sleep apnea. Age. The risk increases with age. What are the signs or symptoms? High blood pressure may not cause symptoms. Very high blood pressure (hypertensive crisis) may cause: Headache. Fast or irregular heartbeats (palpitations). Shortness of breath. Nosebleed. Nausea and vomiting. Vision changes. Severe chest pain, dizziness, and seizures. How is this diagnosed? This condition is diagnosed by  measuring your blood pressure while you are seated, with your arm resting on a flat surface, your legs uncrossed, and your feet flat on the floor. The cuff of the blood pressure monitor will be placed directly against the skin of your upper arm at the level of your heart. Blood pressure should be measured at least twice using the same arm. Certain conditions can cause a difference in blood pressure between your right and left arms. If you have a high blood pressure reading during one visit or you have normal blood pressure with other risk factors, you may be asked to: Return on a different day to have your blood pressure checked again. Monitor your blood pressure at home for 1 week or longer. If you are diagnosed with hypertension, you may have other blood or imaging tests to help your health care provider understand your overall risk for other conditions. How is this treated? This condition is treated by making healthy lifestyle changes, such as eating healthy foods, exercising more, and reducing your alcohol intake. You may be referred for counseling on a healthy diet and physical activity. Your health care provider may prescribe medicine if lifestyle changes are not enough to get your blood pressure under control and if: Your systolic blood pressure is above 130. Your diastolic blood pressure is above 80. Your personal target blood pressure may vary depending on your medical conditions, your age, and other factors. Follow these instructions at home: Eating and drinking  Eat a diet that is high in fiber and potassium, and low in sodium, added sugar, and fat. An example of this eating plan is called the DASH diet. DASH stands for Dietary Approaches to Stop Hypertension. To eat this way: Eat   plenty of fresh fruits and vegetables. Try to fill one half of your plate at each meal with fruits and vegetables. Eat whole grains, such as whole-wheat pasta, brown rice, or whole-grain bread. Fill about one  fourth of your plate with whole grains. Eat or drink low-fat dairy products, such as skim milk or low-fat yogurt. Avoid fatty cuts of meat, processed or cured meats, and poultry with skin. Fill about one fourth of your plate with lean proteins, such as fish, chicken without skin, beans, eggs, or tofu. Avoid pre-made and processed foods. These tend to be higher in sodium, added sugar, and fat. Reduce your daily sodium intake. Many people with hypertension should eat less than 1,500 mg of sodium a day. Do not drink alcohol if: Your health care provider tells you not to drink. You are pregnant, may be pregnant, or are planning to become pregnant. If you drink alcohol: Limit how much you have to: 0-1 drink a day for women. 0-2 drinks a day for men. Know how much alcohol is in your drink. In the [place] one drink equals one 12 oz bottle of beer (355 mL), one 5 oz glass of wine (148 mL), or one 1 oz glass of hard liquor (44 mL). Lifestyle  Work with your health care provider to maintain a healthy body weight or to lose weight. Ask what an ideal weight is for you. Get at least 30 minutes of exercise that causes your heart to beat faster (aerobic exercise) most days of the week. Activities may include walking, swimming, or biking. Include exercise to strengthen your muscles (resistance exercise), such as Pilates or lifting weights, as part of your weekly exercise routine. Try to do these types of exercises for 30 minutes at least 3 days a week. Do not use any products that contain nicotine or tobacco. These products include cigarettes, chewing tobacco, and vaping devices, such as e-cigarettes. If you need help quitting, ask your health care provider. Monitor your blood pressure at home as told by your health care provider. Keep all follow-up visits. This is important. Medicines Take over-the-counter and prescription medicines only as told by your health care provider. Follow directions carefully. Blood  pressure medicines must be taken as prescribed. Do not skip doses of blood pressure medicine. Doing this puts you at risk for problems and can make the medicine less effective. Ask your health care provider about side effects or reactions to medicines that you should watch for. Contact a health care provider if you: Think you are having a reaction to a medicine you are taking. Have headaches that keep coming back (recurring). Feel dizzy. Have swelling in your ankles. Have trouble with your vision. Get help right away if you: Develop a severe headache or confusion. Have unusual weakness or numbness. Feel faint. Have severe pain in your chest or abdomen. Vomit repeatedly. Have trouble breathing. These symptoms may be an emergency. Get help right away. Call 911. Do not wait to see if the symptoms will go away. Do not drive yourself to the hospital. Summary Hypertension is when the force of blood pumping through your arteries is too strong. If this condition is not controlled, it may put you at risk for serious complications. Your personal target blood pressure may vary depending on your medical conditions, your age, and other factors. For most people, a normal blood pressure is less than 120/80. Hypertension is treated with lifestyle changes, medicines, or a combination of both. Lifestyle changes include losing weight, eating a healthy,   low-sodium diet, exercising more, and limiting alcohol. This information is not intended to replace advice given to you by your health care provider. Make sure you discuss any questions you have with your health care provider. Document Revised: 09/04/2021 Document Reviewed: 09/04/2021 Elsevier Patient Education  2024 Elsevier Inc.  

## 2023-04-09 NOTE — Assessment & Plan Note (Signed)
Elevated blood pressure reading in the office and at home Recommend to continue amlodipine 10 mg and start valsartan 80 mg daily Well-controlled diabetes off medications.  Hemoglobin A1c is 6.2 Cardiovascular risks associated with hypertension and diabetes discussed Diet and nutrition discussed.

## 2023-04-09 NOTE — Assessment & Plan Note (Signed)
Exercising more and eating better Continue rosuvastatin 10 mg daily

## 2023-06-16 ENCOUNTER — Encounter (HOSPITAL_COMMUNITY): Payer: Self-pay | Admitting: Emergency Medicine

## 2023-06-16 ENCOUNTER — Other Ambulatory Visit: Payer: Self-pay

## 2023-06-16 ENCOUNTER — Ambulatory Visit (HOSPITAL_COMMUNITY)
Admission: EM | Admit: 2023-06-16 | Discharge: 2023-06-16 | Disposition: A | Discharge status: Home / Self Care | Payer: 59 | Attending: Family Medicine | Admitting: Family Medicine

## 2023-06-16 DIAGNOSIS — R1110 Vomiting, unspecified: Secondary | ICD-10-CM | POA: Diagnosis not present

## 2023-06-16 DIAGNOSIS — R197 Diarrhea, unspecified: Secondary | ICD-10-CM | POA: Diagnosis not present

## 2023-06-16 DIAGNOSIS — M62838 Other muscle spasm: Secondary | ICD-10-CM | POA: Insufficient documentation

## 2023-06-16 LAB — BASIC METABOLIC PANEL
Anion gap: 12 (ref 5–15)
BUN: 9 mg/dL (ref 6–20)
CO2: 24 mmol/L (ref 22–32)
Calcium: 9.4 mg/dL (ref 8.9–10.3)
Chloride: 103 mmol/L (ref 98–111)
Creatinine, Ser: 1.06 mg/dL (ref 0.61–1.24)
GFR, Estimated: >60 mL/min (ref >60)
Glucose, Bld: 118 mg/dL — ABNORMAL HIGH (ref 70–99)
Potassium: 4.6 mmol/L (ref 3.5–5.1)
Sodium: 139 mmol/L (ref 135–145)

## 2023-06-16 LAB — CBC
HCT: 45.4 % (ref 39.0–52.0)
Hemoglobin: 15.1 g/dL (ref 13.0–17.0)
MCH: 29.9 pg (ref 26.0–34.0)
MCHC: 33.3 g/dL (ref 30.0–36.0)
MCV: 89.9 fL (ref 80.0–100.0)
Platelets: 272 10*3/uL (ref 150–400)
RBC: 5.05 MIL/uL (ref 4.22–5.81)
RDW: 14.7 % (ref 11.5–15.5)
WBC: 7.2 10*3/uL (ref 4.0–10.5)
nRBC: 0 % (ref 0.0–0.2)

## 2023-06-16 MED ORDER — IBUPROFEN 800 MG PO TABS: 800.0000 mg | ORAL_TABLET | Freq: Three times a day (TID) | ORAL | 0 refills | Status: DC | PRN

## 2023-06-16 MED ORDER — KETOROLAC TROMETHAMINE 30 MG/ML IJ SOLN
INTRAMUSCULAR | Status: AC
  Filled 2023-06-16: qty 1

## 2023-06-16 MED ORDER — TIZANIDINE HCL 4 MG PO TABS: 4.0000 mg | ORAL_TABLET | Freq: Three times a day (TID) | ORAL | 0 refills | Status: DC | PRN

## 2023-06-16 MED ORDER — KETOROLAC TROMETHAMINE 30 MG/ML IJ SOLN
30.0000 mg | Freq: Once | INTRAMUSCULAR | Status: AC
  Administered 2023-06-16: 30 mg via INTRAMUSCULAR

## 2023-06-16 NOTE — ED Provider Notes (Signed)
[facility]    CSN: [id number] Arrival date & time: 06/16/23  0813      History   Chief Complaint Chief Complaint  Patient presents with   Pain    HPI Ernest Williams is a 52 y.o. male.   HPI Here for muscle spasms and back pain.  This began 2 days ago on August 3.  On August 1 maybe had a little congestion and some nausea.  Then on August 2 he had a few episodes of emesis in which she saw bright red blood.  He also had several episodes of stooling that had some blood when he wiped.  No fever or chills.  The cough has not been persistent or has the congestion.  No dysuria, but he feels that his "kidneys are on fire" He has muscle pain and spasm that started in bilateral flanks and wraps around to the abdomen.  He had a normal bowel movement this morning.         Past Medical History:  Diagnosis Date   Degeneration of spine    Family history of adverse reaction to anesthesia    mother--- ponv   History of concussion    09/ 2013  no loc,  no residual   Hypertension    goes to urgent care , no regular pcp,  (stress echo 12-21-2009 in epic, normal)   Peripheral neuropathy    per pt states told due to degenerative spine   Wears glasses    Wrist fracture, closed, left, initial encounter 03/25/2020    Patient Active Problem List   Diagnosis Date Noted   Dyslipidemia associated with type 2 diabetes mellitus (HCC) 12/04/2021   Dyslipidemia 12/18/2020   CAD (coronary artery disease) 12/18/2020   Hypertension associated with diabetes (HCC) 12/18/2020   Sciatica 12/25/2015    Past Surgical History:  Procedure Laterality Date   NO PAST SURGERIES     ORIF WRIST FRACTURE Left 03/31/2020   Procedure: OPEN REDUCTION INTERNAL FIXATION (ORIF) WRIST FRACTURE;  Surgeon: Sheral Apley, MD;  Location: [facility];  Service: Orthopedics;  Laterality: Left;       Home Medications    Prior to Admission medications   Medication Sig Start Date  End Date Taking? Authorizing Provider  ibuprofen (ADVIL) 800 MG tablet Take 1 tablet (800 mg total) by mouth every 8 (eight) hours as needed (pain). 06/16/23  Yes Zenia Resides, MD  tiZANidine (ZANAFLEX) 4 MG tablet Take 1 tablet (4 mg total) by mouth every 8 (eight) hours as needed for muscle spasms. 06/16/23  Yes Zenia Resides, MD  amLODipine (NORVASC) 10 MG tablet Take 1 tablet (10 mg total) by mouth daily. 07/20/21   [name], MD  Ascorbic Acid (VITAMIN C PO) Take by mouth daily.    [provider]  aspirin EC 81 MG tablet Take 81 mg by mouth daily. 12/19/20   [provider]  Cholecalciferol (VITAMIN D3 PO) Take 1 tablet by mouth daily.    [provider]  diphenhydrAMINE (BENADRYL) 25 MG tablet Take 25 mg by mouth daily as needed.    [provider]  rosuvastatin (CRESTOR) 10 MG tablet Take 1 tablet (10 mg total) by mouth daily. 07/20/21   [name], MD  Testosterone 20.25 MG/ACT (1.62%) GEL SMARTSIG:2 Pump Topical Every Morning 02/13/23   [provider]  valsartan (DIOVAN) 80 MG tablet Take 1 tablet (80 mg total) by mouth daily. 04/09/23   Georgina Quint,  MD    Family History Family History  Problem Relation Age of Onset   Diabetes Mother    Healthy Father     Social History Social History   Tobacco Use   Smoking status: Former    Current packs/day: 0.00    Types: Cigarettes    Start date: 2007    Quit date: 2017    Years since quitting: 7.5   Smokeless tobacco: Never  Vaping Use   Vaping status: Former   Substances: CBD   Devices: vapeppresso  Substance Use Topics   Alcohol use: Not Currently    Comment: occasional   Drug use: Yes    Types: Marijuana    Comment: 03-30-2020 per pt smokes average 2-3g per week for neuropathy     Allergies   Fish allergy, Tomato, Pear, and Strawberry (diagnostic)   Review of Systems Review of Systems   Physical Exam Triage Vital Signs ED Triage  Vitals  Encounter Vitals Group     BP 06/16/23 0846 (!) 163/119     Systolic BP Percentile --      Diastolic BP Percentile --      Pulse Rate 06/16/23 0846 (!) 112     Resp 06/16/23 0846 20     Temp 06/16/23 0846 98.6 F (37 C)     Temp Source 06/16/23 0846 Oral     SpO2 06/16/23 0846 96 %     Weight --      Height --      Head Circumference --      Peak Flow --      Pain Score 06/16/23 0844 10     Pain Loc --      Pain Education --      Exclude from Growth Chart --    No data found.  Updated Vital Signs BP (!) 163/119 (BP Location: Right Arm)   Pulse (!) 112   Temp 98.6 F (37 C) (Oral)   Resp 20   SpO2 96%   Visual Acuity Right Eye Distance:   Left Eye Distance:   Bilateral Distance:    Right Eye Near:   Left Eye Near:    Bilateral Near:     Physical Exam Vitals reviewed.  Constitutional:      Appearance: He is not ill-appearing, toxic-appearing or diaphoretic.     Comments: He has no acute respiratory distress, but he continually grimaces in the exam room, due to the back spasms occurring frequently.  HENT:     Right Ear: Tympanic membrane and ear canal normal.     Left Ear: Tympanic membrane and ear canal normal.     Nose: Nose normal.     Mouth/Throat:     Mouth: Mucous membranes are moist.     Pharynx: No oropharyngeal exudate or posterior oropharyngeal erythema.  Eyes:     Extraocular Movements: Extraocular movements intact.     Conjunctiva/sclera: Conjunctivae normal.     Pupils: Pupils are equal, round, and reactive to light.  Cardiovascular:     Rate and Rhythm: Normal rate and regular rhythm.     Heart sounds: No murmur heard. Pulmonary:     Effort: Pulmonary effort is normal. No respiratory distress.     Breath sounds: Normal breath sounds. No stridor. No wheezing, rhonchi or rales.  Abdominal:     General: Bowel sounds are normal. There is no distension.     Palpations: Abdomen is soft. There is no mass.     Tenderness: There is  no  abdominal tenderness. There is no guarding.  Musculoskeletal:     Cervical back: Neck supple.  Lymphadenopathy:     Cervical: No cervical adenopathy.  Skin:    Capillary Refill: Capillary refill takes less than 2 seconds.     Coloration: Skin is not jaundiced or pale.  Neurological:     General: No focal deficit present.     Mental Status: He is alert and oriented to person, place, and time.  Psychiatric:        Behavior: Behavior normal.      UC Treatments / Results  Labs (all labs ordered are listed, but only abnormal results are displayed) Labs Reviewed  CBC  BASIC METABOLIC PANEL    EKG   Radiology No results found.  Procedures Procedures (including critical care time)  Medications Ordered in UC Medications  ketorolac (TORADOL) 30 MG/ML injection 30 mg (has no administration in time range)    Initial Impression / Assessment and Plan / UC Course  I have reviewed the triage vital signs and the nursing notes.  Pertinent labs & imaging results that were available during my care of the patient were reviewed by me and considered in my medical decision making (see chart for details).       I suspect some sort of viral illness, and he may have an electrolyte abnormality.    Discussed with him considering going to the emergency room but he is resistant to the idea.  BMP and CBC are drawn today.  We will notify him of anything significantly abnormal.  Toradol was given for pain here in the office.  Tizanidine is sent in as a muscle relaxer and some ibuprofen is sent in as needed for pain at home. Final Clinical Impressions(s) / UC Diagnoses   Final diagnoses:  Muscle spasm  Vomiting and diarrhea     Discharge Instructions      You have been given a shot of Toradol 30 mg today.   Take tizanidine 4 mg--1 every 8 hours as needed for muscle spasms; this medication can cause dizziness and sleepiness  Take ibuprofen 800 mg--1 tab every 8 hours as needed for  pain.  Make sure you are getting plenty of fluids in and try to eat some bland foods.  Consider drinking some Gatorade to try gets more sodium and potassium in.  If you worsen anyway or if you not improving with my treatments, please present to to the emergency room     ED Prescriptions     Medication Sig Dispense Auth. Provider   ibuprofen (ADVIL) 800 MG tablet Take 1 tablet (800 mg total) by mouth every 8 (eight) hours as needed (pain). 21 tablet , Janace Aris, MD   tiZANidine (ZANAFLEX) 4 MG tablet Take 1 tablet (4 mg total) by mouth every 8 (eight) hours as needed for muscle spasms. 15 tablet , Janace Aris, MD      I have reviewed the PDMP during this encounter.   Zenia Resides, MD 06/16/23 [phone]

## 2023-06-16 NOTE — Discharge Instructions (Signed)
You have been given a shot of Toradol 30 mg today.   Take tizanidine 4 mg--1 every 8 hours as needed for muscle spasms; this medication can cause dizziness and sleepiness  Take ibuprofen 800 mg--1 tab every 8 hours as needed for pain.  Make sure you are getting plenty of fluids in and try to eat some bland foods.  Consider drinking some Gatorade to try gets more sodium and potassium in.  If you worsen anyway or if you not improving with my treatments, please present to to the emergency room

## 2023-06-16 NOTE — ED Triage Notes (Signed)
Patient presents to [facility] for evaluation of three days of bilateral rib pain, "my kidneys are on file", blood in stool on Friday, vomiting blood on Friday, left sided headache, cough

## 2023-06-19 DIAGNOSIS — R0982 Postnasal drip: Secondary | ICD-10-CM | POA: Insufficient documentation

## 2023-06-19 DIAGNOSIS — H93A2 Pulsatile tinnitus, left ear: Secondary | ICD-10-CM | POA: Insufficient documentation

## 2023-06-19 DIAGNOSIS — R0981 Nasal congestion: Secondary | ICD-10-CM | POA: Insufficient documentation

## 2023-06-19 DIAGNOSIS — H93292 Other abnormal auditory perceptions, left ear: Secondary | ICD-10-CM | POA: Insufficient documentation

## 2023-06-24 ENCOUNTER — Encounter: Payer: 59 | Attending: Physical Medicine & Rehabilitation | Admitting: Physical Medicine & Rehabilitation

## 2023-06-24 ENCOUNTER — Encounter: Payer: Self-pay | Admitting: Physical Medicine & Rehabilitation

## 2023-06-24 VITALS — BP 151/95 | HR 102 | Ht 74.0 in | Wt 268.2 lb

## 2023-06-24 DIAGNOSIS — M79672 Pain in left foot: Secondary | ICD-10-CM | POA: Diagnosis present

## 2023-06-24 DIAGNOSIS — M79602 Pain in left arm: Secondary | ICD-10-CM | POA: Diagnosis not present

## 2023-06-24 MED ORDER — GABAPENTIN 400 MG PO CAPS: 800.0000 mg | ORAL_CAPSULE | Freq: Every day | ORAL | 5 refills | Status: DC

## 2023-06-24 NOTE — Progress Notes (Signed)
Subjective:    Patient ID: Ernest Williams, male    DOB: 10/12/1971, 52 y.o.   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   Gabapentin 800mg  has been out for 6 month, was taking only at hs  Pain Inventory Average Pain 5 Pain Right Now 5 My pain is burning and aching  In the last 24 hours, has pain interfered with the following? General activity 6 Relation with others 3 Enjoyment of life 6 What TIME of day is your pain at its worst? morning  and night Sleep (in general) NA  Pain is worse with: walking, standing, and some activites Pain improves with: medication Relief from Meds:  not specified  Family History  Problem Relation Age of Onset   Diabetes Mother    Healthy Father    Social History   Socioeconomic History   Marital status: Single    Spouse name: Not on file   Number of children: Not on file   Years of education: Not on file   Highest education level: Associate degree: occupational, Scientist, product/process development, or vocational program  Occupational History   Not on file  Tobacco Use   Smoking status: Former    Current packs/day: 0.00    Types: Cigarettes    Start date: 2007    Quit date: 2017    Years since quitting: 7.6   Smokeless tobacco: Never  Vaping Use   Vaping status: Former   Substances: CBD   Devices: vapeppresso  Substance and Sexual Activity   Alcohol use: Not Currently    Comment: occasional   Drug use: Yes    Types: Marijuana    Comment: 03-30-2020 per pt smokes average 2-3g per week for neuropathy   Sexual activity: Not on file  Other Topics Concern   Not on file  Social History Narrative   Not on file   Social Determinants of Health   Financial Resource Strain: Medium Risk (04/09/2023)   Overall Financial Resource Strain (CARDIA)    Difficulty of Paying Living Expenses: Somewhat hard   Food Insecurity: Unknown (04/09/2023)   Hunger Vital Sign    Worried About Running Out of Food in the Last Year: Patient declined    Ran Out of Food in the Last Year: Never true  Transportation Needs: No Transportation Needs (04/09/2023)   PRAPARE - Administrator, Civil Service (Medical): No    Lack of Transportation (Non-Medical): No  Physical Activity: Insufficiently Active (04/09/2023)   Exercise Vital Sign    Days of Exercise per Week: 3 days    Minutes of Exercise per Session: 30 min  Stress: Not on file  Social Connections: Unknown (04/09/2023)   Social Connection and Isolation Panel [NHANES]    Frequency of Communication with Friends and Family: Once a week    Frequency of Social Gatherings with Friends and Family: Once a week    Attends Religious Services: Patient declined    Database administrator or Organizations: No    Attends Engineer, structural: Not on file    Marital Status: Never married   Past Surgical History:  Procedure Laterality Date   NO PAST SURGERIES     ORIF WRIST FRACTURE Left 03/31/2020   Procedure: OPEN REDUCTION INTERNAL FIXATION (ORIF) WRIST FRACTURE;  Surgeon:  Sheral Apley, MD;  Location: [facility];  Service: Orthopedics;  Laterality: Left;   Past Surgical History:  Procedure Laterality Date   NO PAST SURGERIES     ORIF WRIST FRACTURE Left 03/31/2020   Procedure: OPEN REDUCTION INTERNAL FIXATION (ORIF) WRIST FRACTURE;  Surgeon: Sheral Apley, MD;  Location: [facility];  Service: Orthopedics;  Laterality: Left;   Past Medical History:  Diagnosis Date   Degeneration of spine    Family history of adverse reaction to anesthesia    mother--- ponv   History of concussion    09/ 2013  no loc,  no residual   Hypertension    goes to urgent care , no regular pcp,  (stress echo 12-21-2009 in epic, normal)   Peripheral neuropathy    per pt states told due to degenerative spine   Wears glasses     Wrist fracture, closed, left, initial encounter 03/25/2020   BP (!) 141/98   Pulse (!) 102   Ht 6\' 2"  (1.88 m)   Wt 268 lb 3.2 oz (121.7 kg)   SpO2 97%   BMI 34.43 kg/m   Opioid Risk Score:   Fall Risk Score:  `1  Depression screen PHQ 2/9     06/24/2023    2:45 PM 04/09/2023    1:10 PM 10/10/2022   10:09 AM 10/22/2021    9:39 AM 06/21/2021    9:55 AM 05/11/2021    2:30 PM 04/24/2021    2:45 PM  Depression screen PHQ 2/9  Decreased Interest 0 0 0 0 0 0 0  Down, Depressed, Hopeless 0 0 0 0 0 0 0  PHQ - 2 Score 0 0 0 0 0 0 0  Altered sleeping      0   Tired, decreased energy      0   Change in appetite      0   Feeling bad or failure about yourself       0   Trouble concentrating      0   Moving slowly or fidgety/restless      0   Suicidal thoughts      0   PHQ-9 Score      0      Review of Systems  Constitutional: Negative.   HENT: Negative.    Eyes: Negative.   Respiratory: Negative.    Cardiovascular: Negative.   Gastrointestinal: Negative.   Endocrine: Negative.   Genitourinary: Negative.   Musculoskeletal:  Positive for back pain.       4th toe on left foot is numb and burns if he bumps into anything  Skin: Negative.   Allergic/Immunologic: Negative.   Neurological:  Positive for numbness.  Hematological: Negative.   Psychiatric/Behavioral: Negative.    All other systems reviewed and are negative.      Objective:   Physical Exam  General No acute distress Mood and affect appropriate Upper limbs no evidence of swelling or erythema.  No tenderness with light palpation or with range of motion. There is reduced sensation in the left hand to light touch.  Also decreased sensation to pinprick compared to the right side although he is able to identify which fingers touch. Motor strength is 5/5 bilateral deltoid bicep tricep grip He ambulates without assistive device Left foot has some tenderness between the second and third digits at the metatarsal region.   No swelling no erythema no joint deformities.  There is no decreased sensation in the  left foot. His pedal pulses and posterior tibial pulse are normal.      Assessment & Plan:   #1.  Complex regional pain syndrome left upper extremity with history of nerve injury after radial fracture requiring ORIF in 2021.  Will resume gabapentin 800 mg but will give 400 mg 2 tablets nightly.  Will reevaluate in 6 months with nurse practitioner visit 2.  Foot pain between second and third metatarsal suspect Morton's neuroma will refer to podiatry

## 2023-06-30 ENCOUNTER — Other Ambulatory Visit (HOSPITAL_COMMUNITY): Payer: Self-pay | Admitting: Physical Medicine and Rehabilitation

## 2023-06-30 DIAGNOSIS — H93A2 Pulsatile tinnitus, left ear: Secondary | ICD-10-CM

## 2023-07-04 ENCOUNTER — Ambulatory Visit (HOSPITAL_COMMUNITY)
Admission: RE | Admit: 2023-07-04 | Discharge: 2023-07-04 | Disposition: A | Discharge status: Home / Self Care | Payer: 59 | Source: Ambulatory Visit | Attending: Physical Medicine and Rehabilitation | Admitting: Physical Medicine and Rehabilitation

## 2023-07-04 DIAGNOSIS — H93A2 Pulsatile tinnitus, left ear: Secondary | ICD-10-CM | POA: Insufficient documentation

## 2023-07-08 ENCOUNTER — Ambulatory Visit: Payer: 59 | Admitting: Podiatry

## 2023-07-08 ENCOUNTER — Encounter: Payer: Self-pay | Admitting: Podiatry

## 2023-07-08 ENCOUNTER — Other Ambulatory Visit: Payer: Self-pay | Admitting: Podiatry

## 2023-07-08 ENCOUNTER — Other Ambulatory Visit: Payer: Self-pay | Admitting: Cardiology

## 2023-07-08 ENCOUNTER — Ambulatory Visit (INDEPENDENT_AMBULATORY_CARE_PROVIDER_SITE_OTHER): Payer: 59

## 2023-07-08 ENCOUNTER — Other Ambulatory Visit: Payer: Self-pay | Admitting: Registered Nurse

## 2023-07-08 DIAGNOSIS — G5782 Other specified mononeuropathies of left lower limb: Secondary | ICD-10-CM | POA: Diagnosis not present

## 2023-07-08 DIAGNOSIS — M778 Other enthesopathies, not elsewhere classified: Secondary | ICD-10-CM

## 2023-07-08 MED ORDER — TRIAMCINOLONE ACETONIDE 40 MG/ML IJ SUSP
20.0000 mg | Freq: Once | INTRAMUSCULAR | Status: AC
Start: 2023-07-08 — End: 2023-07-08
  Administered 2023-07-08: 20 mg

## 2023-07-08 NOTE — Progress Notes (Signed)
Subjective:  Patient ID: Ernest Williams, male    DOB: February 21, 1971,  MRN: [id number] HPI Chief Complaint  Patient presents with   Toe Pain    2nd toe left - burning, numb, extremely sensitive x 6-7 months, no injury, diabetic neuropathy, last A1c was 5.6   New Patient (Initial Visit)    52 y.o. male presents with the above complaint.   ROS: Denies fever chills nausea vomit muscle aches pains calf pain back pain chest pain shortness of breath.  Past Medical History:  Diagnosis Date   Degeneration of spine    Family history of adverse reaction to anesthesia    mother--- ponv   History of concussion    09/ 2013  no loc,  no residual   Hypertension    goes to urgent care , no regular pcp,  (stress echo 12-21-2009 in epic, normal)   Peripheral neuropathy    per pt states told due to degenerative spine   Wears glasses    Wrist fracture, closed, left, initial encounter 03/25/2020   Past Surgical History:  Procedure Laterality Date   NO PAST SURGERIES     ORIF WRIST FRACTURE Left 03/31/2020   Procedure: OPEN REDUCTION INTERNAL FIXATION (ORIF) WRIST FRACTURE;  Surgeon: Sheral Apley, MD;  Location: [facility];  Service: Orthopedics;  Laterality: Left;    Current Outpatient Medications:    tadalafil (CIALIS) 5 MG tablet, Take 1 tablet by mouth daily., Disp: , Rfl:    amLODipine (NORVASC) 10 MG tablet, Take 1 tablet (10 mg total) by mouth daily. Please call [phone] to schedule an appointment with Dr. Leatrice Jewels for futuer refills. Thank you.1st attempt., Disp: 30 tablet, Rfl: 0   Ascorbic Acid (VITAMIN C PO), Take by mouth daily., Disp: , Rfl:    aspirin EC 81 MG tablet, Take 81 mg by mouth daily., Disp: , Rfl:    Cholecalciferol (VITAMIN D3 PO), Take 1 tablet by mouth daily., Disp: , Rfl:    diphenhydrAMINE (BENADRYL) 25 MG tablet, Take 25 mg by mouth daily as needed., Disp: , Rfl:    gabapentin (NEURONTIN) 400 MG capsule, Take 2 capsules (800 mg total)  by mouth at bedtime., Disp: 60 capsule, Rfl: 5   ibuprofen (ADVIL) 800 MG tablet, Take 1 tablet (800 mg total) by mouth every 8 (eight) hours as needed (pain)., Disp: 21 tablet, Rfl: 0   lisinopril (ZESTRIL) 20 MG tablet, TAKE 1 TABLET BY MOUTH EVERY DAY, Disp: 30 tablet, Rfl: 2   rosuvastatin (CRESTOR) 10 MG tablet, Take 1 tablet (10 mg total) by mouth daily., Disp: 90 tablet, Rfl: 2   Testosterone 20.25 MG/ACT (1.62%) GEL, SMARTSIG:2 Pump Topical Every Morning, Disp: , Rfl:    tiZANidine (ZANAFLEX) 4 MG tablet, Take 1 tablet (4 mg total) by mouth every 8 (eight) hours as needed for muscle spasms., Disp: 15 tablet, Rfl: 0   valsartan (DIOVAN) 80 MG tablet, Take 1 tablet (80 mg total) by mouth daily., Disp: 90 tablet, Rfl: 3  Allergies  Allergen Reactions   Fish Allergy Anaphylaxis   Tomato Anaphylaxis   Pear     Upset stomach   Strawberry (Diagnostic) Swelling    Swelling in mouth Upset stomach   Review of Systems Objective:  There were no vitals filed for this visit.  General: Well developed, nourished, in no acute distress, alert and oriented x3   Dermatological: Skin is warm, dry and supple bilateral. Nails x 10 are well maintained; remaining integument appears unremarkable at this  time. There are no open sores, no preulcerative lesions, no rash or signs of infection present.  Vascular: Dorsalis Pedis artery and Posterior Tibial artery pedal pulses are 2/4 bilateral with immedate capillary fill time. Pedal hair growth present. No varicosities and no lower extremity edema present bilateral.   Neruologic: Grossly intact via light touch bilateral. Vibratory intact via tuning fork bilateral. Protective threshold with Semmes Wienstein monofilament intact to all pedal sites bilateral. Patellar and Achilles deep tendon reflexes 2+ bilateral. No Babinski or clonus noted bilateral.  He has pain on palpation to the second intermetatarsal space of the left foot with radiating pains and a  palpable Mulder's click consistent with neuroma.  I imagine his neuropathy is augmenting his symptomatology.  Musculoskeletal: No gross boney pedal deformities bilateral. No pain, crepitus, or limitation noted with foot and ankle range of motion bilateral. Muscular strength 5/5 in all groups tested bilateral.  Gait: Unassisted, Nonantalgic.    Radiographs:  Radiographs taken today demonstrate osseously mature individual no specific abnormalities in the area of question no acute findings.  Assessment & Plan:   Assessment: Neuroma second interdigital space left foot  Plan: Discussed appropriate shoe gear stretching exercise ice therapy sugar modifications.  I injected 10 mg of Kenalog to the second interspace of the left foot.  If this fails to alleviate his symptomatology may need to consider dehydrate alcohol injections.     Dustie Brittle T. [place], [place]

## 2023-07-08 NOTE — Telephone Encounter (Signed)
Spoke to patient appointment scheduled with Dr.Schumann 10/2 at 11:40 am.Lisinopril refill sent to pharmacy.

## 2023-07-30 ENCOUNTER — Emergency Department (HOSPITAL_COMMUNITY)
Admission: EM | Admit: 2023-07-30 | Discharge: 2023-07-30 | Discharge status: Against Medical Advice | Payer: 59 | Attending: Emergency Medicine | Admitting: Emergency Medicine

## 2023-07-30 ENCOUNTER — Other Ambulatory Visit: Payer: Self-pay

## 2023-07-30 ENCOUNTER — Emergency Department (HOSPITAL_COMMUNITY): Payer: 59

## 2023-07-30 ENCOUNTER — Encounter (HOSPITAL_COMMUNITY): Payer: Self-pay | Admitting: *Deleted

## 2023-07-30 ENCOUNTER — Ambulatory Visit (HOSPITAL_COMMUNITY)
Admission: EM | Admit: 2023-07-30 | Discharge: 2023-07-30 | Disposition: A | Discharge status: Home / Self Care | Payer: 59 | Source: Home / Self Care

## 2023-07-30 DIAGNOSIS — W1830XA Fall on same level, unspecified, initial encounter: Secondary | ICD-10-CM | POA: Diagnosis not present

## 2023-07-30 DIAGNOSIS — S20211A Contusion of right front wall of thorax, initial encounter: Secondary | ICD-10-CM

## 2023-07-30 DIAGNOSIS — W19XXXA Unspecified fall, initial encounter: Secondary | ICD-10-CM | POA: Insufficient documentation

## 2023-07-30 DIAGNOSIS — R051 Acute cough: Secondary | ICD-10-CM | POA: Insufficient documentation

## 2023-07-30 DIAGNOSIS — R0781 Pleurodynia: Secondary | ICD-10-CM | POA: Insufficient documentation

## 2023-07-30 DIAGNOSIS — Z1152 Encounter for screening for COVID-19: Secondary | ICD-10-CM | POA: Insufficient documentation

## 2023-07-30 DIAGNOSIS — Z5321 Procedure and treatment not carried out due to patient leaving prior to being seen by health care provider: Secondary | ICD-10-CM | POA: Insufficient documentation

## 2023-07-30 MED ORDER — GUAIFENESIN ER 600 MG PO TB12: 1200.0000 mg | ORAL_TABLET | Freq: Two times a day (BID) | ORAL | 0 refills | Status: AC

## 2023-07-30 MED ORDER — HYDROCODONE-ACETAMINOPHEN 5-325 MG PO TABS: 2.0000 | ORAL_TABLET | ORAL | 0 refills | Status: DC | PRN

## 2023-07-30 MED ORDER — PROMETHAZINE-DM 6.25-15 MG/5ML PO SYRP: 5.0000 mL | ORAL_SOLUTION | Freq: Four times a day (QID) | ORAL | 0 refills | Status: DC | PRN

## 2023-07-30 NOTE — ED Notes (Signed)
Pt stated that urgent care is opening in a few minutes and pt got results already before seeing the doctor. Per pt "this is ridiculous and [facility] needs to put a sign up".

## 2023-07-30 NOTE — ED Provider Notes (Addendum)
[facility]    CSN: [id number] Arrival date & time: 07/30/23  4098      History   Chief Complaint Chief Complaint  Patient presents with   Fall   Cough    HPI Ernest Williams is a 52 y.o. male.   Patient presents to clinic for complaints of right-sided rib cage pain after a fall on Saturday.  There is some minor healing bruising to the right sided rib cage.  He also started with cough and nasal congestion yesterday.  Coughing makes him breathless. Reports nasal congestion and scratchy throat also started yesterday. Feels hot, did not check temperature at home. Denies N/V/D or abdominal pain.   Had rib imaginign at [facility] ED at 0320 this AM, he eloped from ED prior to care completion.  Imagining showed 'no displaced rib fracture.  No acute cardiopulmonary process.'   The history is provided by the patient and medical records.    Past Medical History:  Diagnosis Date   Degeneration of spine    Family history of adverse reaction to anesthesia    mother--- ponv   History of concussion    09/ 2013  no loc,  no residual   Hypertension    goes to urgent care , no regular pcp,  (stress echo 12-21-2009 in epic, normal)   Peripheral neuropathy    per pt states told due to degenerative spine   Wears glasses    Wrist fracture, closed, left, initial encounter 03/25/2020    Patient Active Problem List   Diagnosis Date Noted   Abnormal auditory perception of left ear 06/19/2023   Nasal congestion 06/19/2023   Postnasal drip 06/19/2023   Pulsatile tinnitus of left ear 06/19/2023   Dyslipidemia associated with type 2 diabetes mellitus (HCC) 12/04/2021   Dyslipidemia 12/18/2020   CAD (coronary artery disease) 12/18/2020   Hypertension associated with diabetes (HCC) 12/18/2020   Sciatica 12/25/2015    Past Surgical History:  Procedure Laterality Date   NO PAST SURGERIES     ORIF WRIST FRACTURE Left 03/31/2020   Procedure: OPEN REDUCTION INTERNAL FIXATION (ORIF) WRIST  FRACTURE;  Surgeon: Sheral Apley, MD;  Location: [facility];  Service: Orthopedics;  Laterality: Left;       Home Medications    Prior to Admission medications   Medication Sig Start Date End Date Taking? Authorizing Provider  amLODipine (NORVASC) 10 MG tablet Take 1 tablet (10 mg total) by mouth daily. Please call [phone] to schedule an appointment with Dr. Leatrice Jewels for futuer refills. Thank you.1st attempt. 07/08/23  Yes [name], MD  anastrozole (ARIMIDEX) 1 MG tablet Take 1 mg by mouth 3 (three) times a week. 07/08/23  Yes [provider]  Ascorbic Acid (VITAMIN C PO) Take by mouth daily.   Yes [provider]  aspirin EC 81 MG tablet Take 81 mg by mouth daily. 12/19/20  Yes [provider]  Cholecalciferol (VITAMIN D3 PO) Take 1 tablet by mouth daily.   Yes [provider]  gabapentin (NEURONTIN) 400 MG capsule Take 2 capsules (800 mg total) by mouth at bedtime. 06/24/23  Yes Kirsteins, Victorino Sparrow, MD  guaiFENesin (MUCINEX) 600 MG 12 hr tablet Take 2 tablets (1,200 mg total) by mouth 2 (two) times daily for 5 days. 07/30/23 08/04/23 Yes Rinaldo Ratel, [place] N, FNP  HYDROcodone-acetaminophen (NORCO/VICODIN) 5-325 MG tablet Take 2 tablets by mouth every 4 (four) hours as needed. 07/30/23  Yes Rinaldo Ratel, [place] N, FNP  ibuprofen (ADVIL) 800  MG tablet Take 1 tablet (800 mg total) by mouth every 8 (eight) hours as needed (pain). 06/16/23  Yes Zenia Resides, MD  lisinopril (ZESTRIL) 20 MG tablet TAKE 1 TABLET BY MOUTH EVERY DAY 07/08/23  Yes [name], MD  promethazine-dextromethorphan (PROMETHAZINE-DM) 6.25-15 MG/5ML syrup Take 5 mLs by mouth 4 (four) times daily as needed for cough. 07/30/23  Yes Rinaldo Ratel, [place] N, FNP  rosuvastatin (CRESTOR) 10 MG tablet Take 1 tablet (10 mg total) by mouth daily. 07/20/21  Yes [name], MD  Testosterone 20.25 MG/ACT (1.62%) GEL SMARTSIG:2 Pump Topical  Every Morning 02/13/23  Yes [provider]  valsartan (DIOVAN) 80 MG tablet Take 1 tablet (80 mg total) by mouth daily. 04/09/23  Yes Sagardia, Eilleen Kempf, MD  diphenhydrAMINE (BENADRYL) 25 MG tablet Take 25 mg by mouth daily as needed.    [provider]  tadalafil (CIALIS) 5 MG tablet Take 1 tablet by mouth daily. 05/01/23   [provider]  tiZANidine (ZANAFLEX) 4 MG tablet Take 1 tablet (4 mg total) by mouth every 8 (eight) hours as needed for muscle spasms. 06/16/23   Zenia Resides, MD    Family History Family History  Problem Relation Age of Onset   Diabetes Mother    Healthy Father     Social History Social History   Tobacco Use   Smoking status: Former    Current packs/day: 0.00    Types: Cigarettes    Start date: 2007    Quit date: 2017    Years since quitting: 7.7   Smokeless tobacco: Never  Vaping Use   Vaping status: Former   Substances: CBD   Devices: vapeppresso  Substance Use Topics   Alcohol use: Not Currently    Comment: occasional   Drug use: Yes    Types: Marijuana    Comment: 03-30-2020 per pt smokes average 2-3g per week for neuropathy     Allergies   Fish allergy, Tomato, Pear, and Strawberry (diagnostic)   Review of Systems Review of Systems  Constitutional:  Positive for diaphoresis.  HENT:  Positive for congestion and sore throat.   Respiratory:  Positive for cough and shortness of breath. Negative for wheezing.   Cardiovascular:  Negative for chest pain.  Gastrointestinal:  Negative for abdominal pain, diarrhea, nausea and vomiting.     Physical Exam Triage Vital Signs ED Triage Vitals  Encounter Vitals Group     BP      Systolic BP Percentile      Diastolic BP Percentile      Pulse      Resp      Temp      Temp src      SpO2      Weight      Height      Head Circumference      Peak Flow      Pain Score      Pain Loc      Pain Education      Exclude from Growth Chart    No data  found.  Updated Vital Signs BP 135/86 (BP Location: Right Arm)   Pulse 95   Temp 98.7 F (37.1 C) (Oral)   Resp 18   SpO2 95%   Visual Acuity Right Eye Distance:   Left Eye Distance:   Bilateral Distance:    Right Eye Near:   Left Eye Near:    Bilateral Near:     Physical Exam Vitals and  nursing note reviewed.  Constitutional:      Appearance: Normal appearance.  HENT:     Head: Normocephalic and atraumatic.     Right Ear: External ear normal.     Left Ear: External ear normal.     Nose: Nose normal.     Mouth/Throat:     Mouth: Mucous membranes are moist.  Eyes:     Conjunctiva/sclera: Conjunctivae normal.  Cardiovascular:     Rate and Rhythm: Normal rate and regular rhythm.     Heart sounds: Normal heart sounds. No murmur heard. Pulmonary:     Effort: Pulmonary effort is normal. No respiratory distress.     Breath sounds: Normal breath sounds.  Chest:     Chest wall: Tenderness present.       Comments: Healing bruising to right anterior rib cage around seventh and eighth rib.  Diffuse right-sided rib cage tenderness. Musculoskeletal:        General: Tenderness and signs of injury present. Normal range of motion.  Skin:    General: Skin is warm and dry.  Neurological:     General: No focal deficit present.     Mental Status: He is alert and oriented to person, place, and time.  Psychiatric:        Mood and Affect: Mood normal.        Behavior: Behavior normal. Behavior is cooperative.      UC Treatments / Results  Labs (all labs ordered are listed, but only abnormal results are displayed) Labs Reviewed  SARS CORONAVIRUS 2 (TAT 6-24 HRS)    EKG   Radiology DG Ribs Unilateral W/Chest Right  Result Date: 07/30/2023 CLINICAL DATA:  Injury, right lateral rib pain EXAM: RIGHT RIBS AND CHEST - 3+ VIEW COMPARISON:  03/05/2023 FINDINGS: No displace fracture or other bone lesions are seen involving the ribs. There is no evidence of pneumothorax or pleural  effusion. Both lungs are clear. Heart size and mediastinal contours are within normal limits. IMPRESSION: No displaced rib fracture.  No acute cardiopulmonary process. Electronically Signed   By: Wiliam Ke M.D.   On: 07/30/2023 03:34    Procedures Procedures (including critical care time)  Medications Ordered in UC Medications - No data to display  Initial Impression / Assessment and Plan / UC Course  I have reviewed the triage vital signs and the nursing notes.  Pertinent labs & imaging results that were available during my care of the patient were reviewed by me and considered in my medical decision making (see chart for details).   Vitals and triage reviewed, patient is hemodynamically stable.  Heart with regular rate and rhythm, lungs are vesicular.  Chest wall tenderness around the seventh, eighth and ninth rib with healing bruising.  Imaging done earlier today in the emergency department showed no displaced fractures, questionable fracture on exam.  Provided with incentive spirometry.  COVID-19 testing done for suspected viral illness, symptomatic management discussed.  Short course of narcotics given for acute pain, side effects discussed.  Strict emergency precautions given.  Plan of care, follow-up care return precautions given, no questions at this time.    Final Clinical Impressions(s) / UC Diagnoses   Final diagnoses:  Acute cough  Rib contusion, right, initial encounter     Discharge Instructions      Please use your incentive spirometer multiple times per hour to encourage full lung expansion and to help prevent pneumonia.  You can take the Norco sparingly, do not drink or drive on this  medication as it may cause drowsiness.  For breakthrough pain you can use 100 mg of ibuprofen up to 3 times daily with food.  You can take the cough syrup as needed, this medication also may cause drowsiness.  We have swabbed you for COVID-19 and our staff will contact you if  positive.  Seek immediate care if you develop worsening shortness of breath, loss of consciousness, or any new concerning symptoms.      ED Prescriptions     Medication Sig Dispense Auth. Provider   promethazine-dextromethorphan (PROMETHAZINE-DM) 6.25-15 MG/5ML syrup Take 5 mLs by mouth 4 (four) times daily as needed for cough. 118 mL Rinaldo Ratel, [place], [place]   HYDROcodone-acetaminophen (NORCO/VICODIN) 5-325 MG tablet Take 2 tablets by mouth every 4 (four) hours as needed. 10 tablet Rinaldo Ratel, [place] N, FNP   guaiFENesin (MUCINEX) 600 MG 12 hr tablet Take 2 tablets (1,200 mg total) by mouth 2 (two) times daily for 5 days. 20 tablet Gladis Soley, [place] N, [place]      I have reviewed the PDMP during this encounter.     Rinaldo Ratel [place] N, [place] 07/30/23 [phone]

## 2023-07-30 NOTE — Discharge Instructions (Addendum)
Please use your incentive spirometer multiple times per hour to encourage full lung expansion and to help prevent pneumonia.  You can take the Norco sparingly, do not drink or drive on this medication as it may cause drowsiness.  For breakthrough pain you can use 100 mg of ibuprofen up to 3 times daily with food.  You can take the cough syrup as needed, this medication also may cause drowsiness.  We have swabbed you for COVID-19 and our staff will contact you if positive.  Seek immediate care if you develop worsening shortness of breath, loss of consciousness, or any new concerning symptoms.

## 2023-07-30 NOTE — ED Triage Notes (Signed)
Patient fell and landed on his boot last Saturday , no LOC/ambulatory , reports pain at right lateral ribcage ,respirations unlabored.

## 2023-07-30 NOTE — ED Triage Notes (Signed)
Pt states he fell on Saturday and now is having right sided pain since. He was in ED but left due to wait (xray completed).   He has also developed a cough since yesterday.

## 2023-07-31 LAB — SARS CORONAVIRUS 2 (TAT 6-24 HRS): SARS Coronavirus 2: NEGATIVE

## 2023-08-09 ENCOUNTER — Other Ambulatory Visit: Payer: Self-pay | Admitting: Cardiology

## 2023-08-12 NOTE — Progress Notes (Unsigned)
Cardiology Office Note:    Date:  08/13/2023   ID:  Ernest Williams, DOB [birth year], MRN [id number]  PCP:  Georgina Quint, MD  Cardiologist:  None  Electrophysiologist:  None   Referring MD: Georgina Quint, *   Chief Complaint  Patient presents with   Follow-up    Pt states has been light headed and shortness of breath    History of Present Illness:    Ernest Williams is a 52 y.o. male with a hx of hypertension, peripheral neuropathy of unknown etiology, significant family history of early CAD who presents for follow-up.  He was admitted from 12/17/2020 through 12/18/2020 to [facility] with chest pain.  EKG unremarkable, negative troponins.  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  Echocardiogram showed normal biventricular function, no significant valvular disease.  Since last clinic visit, he reports he is doing okay.  He denies any chest pain or dyspnea.  Reports he walks 5 miles or so at work each day, denies any exertional symptoms.  Reports some lightheadedness but denies any syncope.  Denies any lower extremity edema or palpitations.  Wt Readings from Last 3 Encounters:  08/13/23 264 lb (119.7 kg)  08/13/23 261 lb (118.4 kg)  06/24/23 268 lb 3.2 oz (121.7 kg)     Past Medical History:  Diagnosis Date   Degeneration of spine    Family history of adverse reaction to anesthesia    mother--- ponv   History of concussion    09/ 2013  no loc,  no residual   Hypertension    goes to urgent care , no regular pcp,  (stress echo 12-21-2009 in epic, normal)   Peripheral neuropathy    per pt states told due to degenerative spine   Wears glasses    Wrist fracture, closed, left, initial encounter 03/25/2020    Past Surgical History:  Procedure Laterality Date   NO PAST SURGERIES     ORIF WRIST FRACTURE Left 03/31/2020   Procedure: OPEN REDUCTION INTERNAL FIXATION (ORIF) WRIST FRACTURE;  Surgeon: Sheral Apley, MD;   Location: [facility];  Service: Orthopedics;  Laterality: Left;    Current Medications: Current Meds  Medication Sig   amLODipine (NORVASC) 10 MG tablet Take 1 tablet (10 mg total) by mouth daily. Please call [phone] to schedule an appointment with Dr. Leatrice Jewels for futuer refills. Thank you.1st attempt.   anastrozole (ARIMIDEX) 1 MG tablet Take 1 mg by mouth 3 (three) times a week.   Ascorbic Acid (VITAMIN C PO) Take by mouth daily.   aspirin EC 81 MG tablet Take 81 mg by mouth daily.   Cholecalciferol (VITAMIN D3 PO) Take 1 tablet by mouth daily.   diphenhydrAMINE (BENADRYL) 25 MG tablet Take 25 mg by mouth daily as needed. (Patient not taking: Reported on 08/13/2023)   gabapentin (NEURONTIN) 400 MG capsule Take 2 capsules (800 mg total) by mouth at bedtime.   HYDROcodone-acetaminophen (NORCO/VICODIN) 5-325 MG tablet Take 2 tablets by mouth every 4 (four) hours as needed.   ibuprofen (ADVIL) 800 MG tablet Take 1 tablet (800 mg total) by mouth every 8 (eight) hours as needed (pain).   promethazine-dextromethorphan (PROMETHAZINE-DM) 6.25-15 MG/5ML syrup Take 5 mLs by mouth 4 (four) times daily as needed for cough.   tadalafil (CIALIS) 5 MG tablet Take 1 tablet by mouth daily.   Testosterone 20.25 MG/ACT (1.62%) GEL SMARTSIG:2 Pump Topical Every Morning   tiZANidine (ZANAFLEX) 4 MG tablet Take 1 tablet (  4 mg total) by mouth every 8 (eight) hours as needed for muscle spasms.   valsartan (DIOVAN) 80 MG tablet Take 1 tablet (80 mg total) by mouth daily.   [DISCONTINUED] lisinopril (ZESTRIL) 20 MG tablet TAKE 1 TABLET BY MOUTH EVERY DAY   [DISCONTINUED] rosuvastatin (CRESTOR) 10 MG tablet Take 1 tablet (10 mg total) by mouth daily.     Allergies:   Fish allergy, Tomato, Pear, and Strawberry (diagnostic)   Social History   Socioeconomic History   Marital status: Single    Spouse name: Not on file   Number of children: Not on file   Years of education: Not on  file   Highest education level: Associate degree: occupational, Scientist, product/process development, or vocational program  Occupational History   Not on file  Tobacco Use   Smoking status: Former    Current packs/day: 0.00    Types: Cigarettes    Start date: 2007    Quit date: 2017    Years since quitting: 7.7   Smokeless tobacco: Never  Vaping Use   Vaping status: Former   Substances: CBD   Devices: vapeppresso  Substance and Sexual Activity   Alcohol use: Not Currently    Comment: occasional   Drug use: Yes    Types: Marijuana    Comment: 03-30-2020 per pt smokes average 2-3g per week for neuropathy   Sexual activity: Not Currently  Other Topics Concern   Not on file  Social History Narrative   Not on file   Social Determinants of Health   Financial Resource Strain: Medium Risk (04/09/2023)   Overall Financial Resource Strain (CARDIA)    Difficulty of Paying Living Expenses: Somewhat hard  Food Insecurity: Unknown (04/09/2023)   Hunger Vital Sign    Worried About Running Out of Food in the Last Year: Patient declined    Ran Out of Food in the Last Year: Never true  Transportation Needs: No Transportation Needs (04/09/2023)   PRAPARE - Administrator, Civil Service (Medical): No    Lack of Transportation (Non-Medical): No  Physical Activity: Insufficiently Active (04/09/2023)   Exercise Vital Sign    Days of Exercise per Week: 3 days    Minutes of Exercise per Session: 30 min  Stress: Not on file  Social Connections: Unknown (04/09/2023)   Social Connection and Isolation Panel [NHANES]    Frequency of Communication with Friends and Family: Once a week    Frequency of Social Gatherings with Friends and Family: Once a week    Attends Religious Services: Patient declined    Database administrator or Organizations: No    Attends Engineer, structural: Not on file    Marital Status: Never married     Family History: The patient's family history includes Diabetes in his  mother; Healthy in his father.  ROS:   Please see the history of present illness.     All other systems reviewed and are negative.  EKGs/Labs/Other Studies Reviewed:    The following studies were reviewed today:   EKG:   07/01/2022: Normal sinus rhythm, rate 85, nonspecific T wave flattening 10-24: Normal sinus rhythm, rate 96, nonspecific T wave flattening  Recent Labs: 10/10/2022: ALT 33 06/16/2023: BUN 9; Creatinine, Ser 1.06; Hemoglobin 15.1; Platelets 272; Potassium 4.6; Sodium 139  Recent Lipid Panel    Component Value Date/Time   CHOL 253 (H) 10/10/2022 1047   TRIG 125.0 10/10/2022 1047   HDL 59.60 10/10/2022 1047   CHOLHDL 4 10/10/2022  1047   VLDL 25.0 10/10/2022 1047   LDLCALC 168 (H) 10/10/2022 1047    Physical Exam:    VS:  BP 136/74 (BP Location: Left Arm, Patient Position: Sitting, Cuff Size: Normal)   Pulse 96   Ht 6\' 2"  (1.88 m)   Wt 261 lb (118.4 kg)   SpO2 92%   BMI 33.51 kg/m     Wt Readings from Last 3 Encounters:  08/13/23 264 lb (119.7 kg)  08/13/23 261 lb (118.4 kg)  06/24/23 268 lb 3.2 oz (121.7 kg)     GEN:  Well nourished, well developed in no acute distress HEENT: Normal NECK: No JVD; No carotid bruits LYMPHATICS: No lymphadenopathy CARDIAC: RRR, no murmurs, rubs, gallops RESPIRATORY:  Clear to auscultation without rales, wheezing or rhonchi  ABDOMEN: Soft, non-tender, non-distended MUSCULOSKELETAL:  No edema; No deformity  SKIN: Warm and dry NEUROLOGIC:  Alert and oriented x 3 PSYCHIATRIC:  Normal affect   ASSESSMENT:    1. Coronary artery disease involving native coronary artery of native heart without angina pectoris   2. Hypertension associated with diabetes (HCC)   3. Hyperlipidemia, unspecified hyperlipidemia type       PLAN:     CAD: He was admitted from 12/17/2020 through 12/18/2020 to [facility] with chest pain.  EKG unremarkable, negative troponins.  Underwent coronary CTA which showed nonobstructive CAD (calcium score 26,  85th percentile).  Echocardiogram showed normal biventricular function, no significant valvular disease. -He stopped taking rosuvastatin, will restart rosuvastatin 10 mg daily  HLD: LDL 165 on 12/18/2020 with nonobstructive CAD on coronary CTA.  Started rosuvastatin 10 mg daily.  LDL 168 on 10/10/2022, suspect not taking rosuvastatin.  He reports has been off rosuvastatin for 3 months.  Will restart rosuvastatin 10 mg daily, and check fasting lipid panel in 2 months  HTN: On valsartan 80 mg daily and amlodipine 10 mg daily.  Appears controlled  Leg pain: had planned for ABIs, but he did not have done.  Reported walking a lot with new job and reports pain resolved.  No further testing recommended at this time.  RTC in 6 months   Medication Adjustments/Labs and Tests Ordered: Current medicines are reviewed at length with the patient today.  Concerns regarding medicines are outlined above.  Orders Placed This Encounter  Procedures   Lipid panel   EKG 12-Lead    Meds ordered this encounter  Medications   rosuvastatin (CRESTOR) 10 MG tablet    Sig: Take 1 tablet (10 mg total) by mouth daily.    Dispense:  90 tablet    Refill:  3     Patient Instructions  Medication Instructions:  Restart Rosuvastatin 10mg   every day Refill sent to your Pharmacy Continue all other medications  *If you need a refill on your cardiac medications before your next appointment, please call your pharmacy*   Lab Work: Lipid Panel  have done in 2 months  If you have labs (blood work) drawn today and your tests are completely normal, you will receive your results only by: MyChart Message (if you have MyChart) OR A paper copy in the mail If you have any lab test that is abnormal or we need to change your treatment, we will call you to review the results.   Testing/Procedures: None  Follow-Up: At [facility], you and your health needs are our priority.  As part of our continuing mission to  provide you with exceptional heart care, we have created designated Provider Care Teams.  These Care Teams include your primary Cardiologist (physician) and Advanced Practice Providers (APPs -  Physician Assistants and Nurse Practitioners) who all work together to provide you with the care you need, when you need it.  We recommend signing up for the patient portal called "MyChart".  Sign up information is provided on this After Visit Summary.  MyChart is used to connect with patients for Virtual Visits (Telemedicine).  Patients are able to view lab/test results, encounter notes, upcoming appointments, etc.  Non-urgent messages can be sent to your provider as well.   To learn more about what you can do with MyChart, go to [email or web address].    Your next appointment:    6 months  call in Jan. 2025 to make April appt  Provider:    Dr. Bjorn Pippin     Signed, [name], MD  08/13/2023 1:40 PM    [facility]

## 2023-08-13 ENCOUNTER — Ambulatory Visit: Payer: 59 | Admitting: Emergency Medicine

## 2023-08-13 ENCOUNTER — Encounter: Payer: Self-pay | Admitting: Cardiology

## 2023-08-13 ENCOUNTER — Encounter: Payer: Self-pay | Admitting: Emergency Medicine

## 2023-08-13 ENCOUNTER — Ambulatory Visit: Payer: 59 | Attending: Cardiology | Admitting: Cardiology

## 2023-08-13 VITALS — BP 136/74 | HR 96 | Ht 74.0 in | Wt 261.0 lb

## 2023-08-13 VITALS — BP 144/82 | HR 104 | Temp 98.7°F | Ht 74.0 in | Wt 264.0 lb

## 2023-08-13 DIAGNOSIS — E1169 Type 2 diabetes mellitus with other specified complication: Secondary | ICD-10-CM

## 2023-08-13 DIAGNOSIS — I2510 Atherosclerotic heart disease of native coronary artery without angina pectoris: Secondary | ICD-10-CM

## 2023-08-13 DIAGNOSIS — E1159 Type 2 diabetes mellitus with other circulatory complications: Secondary | ICD-10-CM | POA: Diagnosis not present

## 2023-08-13 DIAGNOSIS — I152 Hypertension secondary to endocrine disorders: Secondary | ICD-10-CM | POA: Diagnosis not present

## 2023-08-13 DIAGNOSIS — E785 Hyperlipidemia, unspecified: Secondary | ICD-10-CM | POA: Diagnosis not present

## 2023-08-13 DIAGNOSIS — M79605 Pain in left leg: Secondary | ICD-10-CM

## 2023-08-13 LAB — CBC WITH DIFFERENTIAL/PLATELET
Basophils Absolute: 0.1 10*3/uL (ref 0.0–0.1)
Basophils Relative: 1 % (ref 0.0–3.0)
Eosinophils Absolute: 0 10*3/uL (ref 0.0–0.7)
Eosinophils Relative: 0.3 % (ref 0.0–5.0)
HCT: 43.9 % (ref 39.0–52.0)
Hemoglobin: 14.2 g/dL (ref 13.0–17.0)
Lymphocytes Relative: 22.5 % (ref 12.0–46.0)
Lymphs Abs: 2.2 10*3/uL (ref 0.7–4.0)
MCHC: 32.2 g/dL (ref 30.0–36.0)
MCV: 92.8 fL (ref 78.0–100.0)
Monocytes Absolute: 0.5 10*3/uL (ref 0.1–1.0)
Monocytes Relative: 5 % (ref 3.0–12.0)
Neutro Abs: 6.9 10*3/uL (ref 1.4–7.7)
Neutrophils Relative %: 71.2 % (ref 43.0–77.0)
Platelets: 287 10*3/uL (ref 150.0–400.0)
RBC: 4.73 Mil/uL (ref 4.22–5.81)
RDW: 14 % (ref 11.5–15.5)
WBC: 9.7 10*3/uL (ref 4.0–10.5)

## 2023-08-13 LAB — LIPID PANEL
Cholesterol: 228 mg/dL — ABNORMAL HIGH (ref 0–200)
HDL: 58.9 mg/dL (ref >39.00)
LDL Cholesterol: 145 mg/dL — ABNORMAL HIGH (ref 0–99)
NonHDL: 169.04
Total CHOL/HDL Ratio: 4
Triglycerides: 118 mg/dL (ref 0.0–149.0)
VLDL: 23.6 mg/dL (ref 0.0–40.0)

## 2023-08-13 LAB — COMPREHENSIVE METABOLIC PANEL
ALT: 30 U/L (ref 0–53)
AST: 24 U/L (ref 0–37)
Albumin: 4.6 g/dL (ref 3.5–5.2)
Alkaline Phosphatase: 69 U/L (ref 39–117)
BUN: 16 mg/dL (ref 6–23)
CO2: 29 meq/L (ref 19–32)
Calcium: 9.9 mg/dL (ref 8.4–10.5)
Chloride: 103 meq/L (ref 96–112)
Creatinine, Ser: 1.37 mg/dL (ref 0.40–1.50)
GFR: 58.49 mL/min — ABNORMAL LOW (ref >60.00)
Glucose, Bld: 201 mg/dL — ABNORMAL HIGH (ref 70–99)
Potassium: 4.4 meq/L (ref 3.5–5.1)
Sodium: 141 meq/L (ref 135–145)
Total Bilirubin: 0.4 mg/dL (ref 0.2–1.2)
Total Protein: 7.6 g/dL (ref 6.0–8.3)

## 2023-08-13 LAB — POCT GLYCOSYLATED HEMOGLOBIN (HGB A1C): Hemoglobin A1C: 6.1 % — AB (ref 4.0–5.6)

## 2023-08-13 MED ORDER — TIZANIDINE HCL 4 MG PO TABS: 4.0000 mg | ORAL_TABLET | Freq: Three times a day (TID) | ORAL | 1 refills | Status: DC | PRN

## 2023-08-13 MED ORDER — ROSUVASTATIN CALCIUM 10 MG PO TABS: 10.0000 mg | ORAL_TABLET | Freq: Every day | ORAL | 3 refills | Status: AC

## 2023-08-13 NOTE — Progress Notes (Signed)
Ernest Williams 52 y.o.   Chief Complaint  Patient presents with   Medical Management of Chronic Issues    Patient was seen in the ED for a cough, patient states he is coughing up clear mucus, rib fracture,   A1c check     HISTORY OF PRESENT ILLNESS: This is a 52 y.o. male here for follow-up of diabetes and hypertension Saw cardiologist earlier today.  No abnormal findings. Recent right-sided rib fracture about 2 weeks ago.  Occasional cough.  Still having some pain on the right side. No other complaints or medical concerns today. Had colonoscopy done last year at [facility].  Multiple noncancerous polyps were removed  HPI   Prior to Admission medications   Medication Sig Start Date End Date Taking? Authorizing Provider  amLODipine (NORVASC) 10 MG tablet Take 1 tablet (10 mg total) by mouth daily. Please call [phone] to schedule an appointment with Dr. Leatrice Jewels for futuer refills. Thank you.1st attempt. 07/08/23  Yes [name], MD  anastrozole (ARIMIDEX) 1 MG tablet Take 1 mg by mouth 3 (three) times a week. 07/08/23  Yes [provider]  Ascorbic Acid (VITAMIN C PO) Take by mouth daily.   Yes [provider]  aspirin EC 81 MG tablet Take 81 mg by mouth daily. 12/19/20  Yes [provider]  Cholecalciferol (VITAMIN D3 PO) Take 1 tablet by mouth daily.   Yes [provider]  gabapentin (NEURONTIN) 400 MG capsule Take 2 capsules (800 mg total) by mouth at bedtime. 06/24/23  Yes Kirsteins, Victorino Sparrow, MD  HYDROcodone-acetaminophen (NORCO/VICODIN) 5-325 MG tablet Take 2 tablets by mouth every 4 (four) hours as needed. 07/30/23  Yes Rinaldo Ratel, [place] N, FNP  ibuprofen (ADVIL) 800 MG tablet Take 1 tablet (800 mg total) by mouth every 8 (eight) hours as needed (pain). 06/16/23  Yes Zenia Resides, MD  promethazine-dextromethorphan (PROMETHAZINE-DM) 6.25-15 MG/5ML syrup Take 5 mLs by mouth 4 (four) times daily as needed for cough.  07/30/23  Yes Rinaldo Ratel, [place] N, FNP  rosuvastatin (CRESTOR) 10 MG tablet Take 1 tablet (10 mg total) by mouth daily. 08/13/23  Yes [name], MD  tadalafil (CIALIS) 5 MG tablet Take 1 tablet by mouth daily. 05/01/23  Yes [provider]  Testosterone 20.25 MG/ACT (1.62%) GEL SMARTSIG:2 Pump Topical Every Morning 02/13/23  Yes [provider]  tiZANidine (ZANAFLEX) 4 MG tablet Take 1 tablet (4 mg total) by mouth every 8 (eight) hours as needed for muscle spasms. 06/16/23  Yes Zenia Resides, MD  valsartan (DIOVAN) 80 MG tablet Take 1 tablet (80 mg total) by mouth daily. 04/09/23  Yes Caci Orren, Eilleen Kempf, MD  diphenhydrAMINE (BENADRYL) 25 MG tablet Take 25 mg by mouth daily as needed. Patient not taking: Reported on 08/13/2023    [provider]    Allergies  Allergen Reactions   Fish Allergy Anaphylaxis   Tomato Anaphylaxis   Pear     Upset stomach   Strawberry (Diagnostic) Swelling    Swelling in mouth Upset stomach    Patient Active Problem List   Diagnosis Date Noted   Dyslipidemia associated with type 2 diabetes mellitus (HCC) 12/04/2021   Dyslipidemia 12/18/2020   CAD (coronary artery disease) 12/18/2020   Hypertension associated with diabetes (HCC) 12/18/2020    Past Medical History:  Diagnosis Date   Degeneration of spine    Family history of adverse reaction to anesthesia    mother--- ponv   History of concussion  09/ 2013  no loc,  no residual   Hypertension    goes to urgent care , no regular pcp,  (stress echo 12-21-2009 in epic, normal)   Peripheral neuropathy    per pt states told due to degenerative spine   Wears glasses    Wrist fracture, closed, left, initial encounter 03/25/2020    Past Surgical History:  Procedure Laterality Date   NO PAST SURGERIES     ORIF WRIST FRACTURE Left 03/31/2020   Procedure: OPEN REDUCTION INTERNAL FIXATION (ORIF) WRIST FRACTURE;  Surgeon: Sheral Apley, MD;  Location: [facility];  Service: Orthopedics;  Laterality: Left;    Social History   Socioeconomic History   Marital status: Single    Spouse name: Not on file   Number of children: Not on file   Years of education: Not on file   Highest education level: Associate degree: occupational, Scientist, product/process development, or vocational program  Occupational History   Not on file  Tobacco Use   Smoking status: Former    Current packs/day: 0.00    Types: Cigarettes    Start date: 2007    Quit date: 2017    Years since quitting: 7.7   Smokeless tobacco: Never  Vaping Use   Vaping status: Former   Substances: CBD   Devices: vapeppresso  Substance and Sexual Activity   Alcohol use: Not Currently    Comment: occasional   Drug use: Yes    Types: Marijuana    Comment: 03-30-2020 per pt smokes average 2-3g per week for neuropathy   Sexual activity: Not Currently  Other Topics Concern   Not on file  Social History Narrative   Not on file   Social Determinants of Health   Financial Resource Strain: Medium Risk (04/09/2023)   Overall Financial Resource Strain (CARDIA)    Difficulty of Paying Living Expenses: Somewhat hard  Food Insecurity: Unknown (04/09/2023)   Hunger Vital Sign    Worried About Running Out of Food in the Last Year: Patient declined    Ran Out of Food in the Last Year: Never true  Transportation Needs: No Transportation Needs (04/09/2023)   PRAPARE - Administrator, Civil Service (Medical): No    Lack of Transportation (Non-Medical): No  Physical Activity: Insufficiently Active (04/09/2023)   Exercise Vital Sign    Days of Exercise per Week: 3 days    Minutes of Exercise per Session: 30 min  Stress: Not on file  Social Connections: Unknown (04/09/2023)   Social Connection and Isolation Panel [NHANES]    Frequency of Communication with Friends and Family: Once a week    Frequency of Social Gatherings with Friends and Family: Once a week    Attends Religious Services: Patient  declined    Database administrator or Organizations: No    Attends Engineer, structural: Not on file    Marital Status: Never married  Intimate Partner Violence: Not on file    Family History  Problem Relation Age of Onset   Diabetes Mother    Healthy Father      Review of Systems  Constitutional: Negative.  Negative for chills and fever.  HENT: Negative.  Negative for congestion and sore throat.   Respiratory: Negative.  Negative for cough and shortness of breath.   Cardiovascular: Negative.  Negative for chest pain and palpitations.  Gastrointestinal:  Negative for abdominal pain, nausea and vomiting.  Genitourinary: Negative.  Negative for dysuria and hematuria.  Skin:  Negative.  Negative for rash.  Neurological: Negative.  Negative for dizziness and headaches.  All other systems reviewed and are negative.   Today's Vitals   08/13/23 1328  BP: (!) 144/82  Pulse: (!) 104  Temp: 98.7 F (37.1 C)  TempSrc: Oral  SpO2: 98%  Weight: 264 lb (119.7 kg)  Height: 6\' 2"  (1.88 m)   Body mass index is 33.9 kg/m.   Physical Exam Vitals reviewed.  Constitutional:      Appearance: Normal appearance.  HENT:     Head: Normocephalic.     Mouth/Throat:     Mouth: Mucous membranes are moist.     Pharynx: Oropharynx is clear.  Eyes:     Extraocular Movements: Extraocular movements intact.     Pupils: Pupils are equal, round, and reactive to light.  Cardiovascular:     Rate and Rhythm: Normal rate and regular rhythm.     Pulses: Normal pulses.     Heart sounds: Normal heart sounds.  Pulmonary:     Effort: Pulmonary effort is normal.     Breath sounds: Normal breath sounds.  Abdominal:     Palpations: Abdomen is soft.     Tenderness: There is no abdominal tenderness.  Musculoskeletal:     Cervical back: No tenderness.  Lymphadenopathy:     Cervical: No cervical adenopathy.  Skin:    General: Skin is warm and dry.     Capillary Refill: Capillary refill takes  less than 2 seconds.  Neurological:     General: No focal deficit present.     Mental Status: He is alert and oriented to person, place, and time.  Psychiatric:        Mood and Affect: Mood normal.        Behavior: Behavior normal.    Results for orders placed or performed in visit on 08/13/23 (from the past 24 hour(s))  CBC with Differential/Platelet     Status: None   Collection Time: 08/13/23  1:59 PM  Result Value Ref Range   WBC 9.7 4.0 - 10.5 K/uL   RBC 4.73 4.22 - 5.81 Mil/uL   Hemoglobin 14.2 13.0 - 17.0 g/dL   HCT 53.6 64.4 - 03.4 %   MCV 92.8 78.0 - 100.0 fl   MCHC 32.2 30.0 - 36.0 g/dL   RDW 74.2 59.5 - 63.8 %   Platelets 287.0 150.0 - 400.0 K/uL   Neutrophils Relative % 71.2 43.0 - 77.0 %   Lymphocytes Relative 22.5 12.0 - 46.0 %   Monocytes Relative 5.0 3.0 - 12.0 %   Eosinophils Relative 0.3 0.0 - 5.0 %   Basophils Relative 1.0 0.0 - 3.0 %   Neutro Abs 6.9 1.4 - 7.7 K/uL   Lymphs Abs 2.2 0.7 - 4.0 K/uL   Monocytes Absolute 0.5 0.1 - 1.0 K/uL   Eosinophils Absolute 0.0 0.0 - 0.7 K/uL   Basophils Absolute 0.1 0.0 - 0.1 K/uL  Comprehensive metabolic panel     Status: Abnormal   Collection Time: 08/13/23  1:59 PM  Result Value Ref Range   Sodium 141 135 - 145 mEq/L   Potassium 4.4 3.5 - 5.1 mEq/L   Chloride 103 96 - 112 mEq/L   CO2 29 19 - 32 mEq/L   Glucose, Bld 201 (H) 70 - 99 mg/dL   BUN 16 6 - 23 mg/dL   Creatinine, Ser 7.56 0.40 - 1.50 mg/dL   Total Bilirubin 0.4 0.2 - 1.2 mg/dL   Alkaline Phosphatase 69 39 -  117 U/L   AST 24 0 - 37 U/L   ALT 30 0 - 53 U/L   Total Protein 7.6 6.0 - 8.3 g/dL   Albumin 4.6 3.5 - 5.2 g/dL   GFR 16.10 (L) >96.04 mL/min   Calcium 9.9 8.4 - 10.5 mg/dL  Lipid panel     Status: Abnormal   Collection Time: 08/13/23  1:59 PM  Result Value Ref Range   Cholesterol 228 (H) 0 - 200 mg/dL   Triglycerides 540.9 0.0 - 149.0 mg/dL   HDL 81.19 >14.78 mg/dL   VLDL 29.5 0.0 - 62.1 mg/dL   LDL Cholesterol 308 (H) 0 - 99 mg/dL   Total  CHOL/HDL Ratio 4    NonHDL 169.04   POCT HgB A1C     Status: Abnormal   Collection Time: 08/13/23  4:06 PM  Result Value Ref Range   Hemoglobin A1C 6.1 (A) 4.0 - 5.6 %   HbA1c POC (<> result, manual entry)     HbA1c, POC (prediabetic range)     HbA1c, POC (controlled diabetic range)       ASSESSMENT & PLAN: A total of 43 minutes was spent with the patient and counseling/coordination of care regarding preparing for this visit, review of most recent office visit notes, review of most recent emergency department visit notes, review of multiple chronic medical conditions under management, review of most recent blood work results including interpretation of today's hemoglobin A1c, cardiovascular risks associated with uncontrolled hypertension, review of all medications, education and nutrition, prognosis, documentation, and need for follow-up.  Problem List Items Addressed This Visit       Cardiovascular and Mediastinum   CAD (coronary artery disease)    Seen by cardiologist earlier today.  Stable chronic condition. No recent anginal episodes.  No concerns. Well-controlled hypertension Continues daily baby aspirin      Relevant Orders   CBC with Differential/Platelet   Comprehensive metabolic panel   Lipid panel   Hypertension associated with diabetes (HCC) - Primary    Elevated blood pressure readings in the office but normal at home Continue valsartan 80 mg daily and amlodipine 10 mg daily Advised to continue monitoring blood pressure readings at home daily for the next several weeks and keep a log.  Advised to contact the office if numbers persistently elevated. Well-controlled diabetes off medications with hemoglobin A1c at 6.1 Cardiovascular risks associated with hypertension and diabetes discussed Diet and nutrition discussed Follow-up in 6 months      Relevant Orders   POCT HgB A1C   CBC with Differential/Platelet   Comprehensive metabolic panel   Lipid panel      Endocrine   Dyslipidemia associated with type 2 diabetes mellitus (HCC)    Chronic stable conditions Diet and nutrition discussed Continue rosuvastatin 10 mg daily The 10-year ASCVD risk score (Arnett DK, et al., 2019) is: 15.3%   Values used to calculate the score:     Age: 67 years     Sex: Male     Is Non-Hispanic African American: No     Diabetic: Yes     Tobacco smoker: No     Systolic Blood Pressure: 144 mmHg     Is BP treated: Yes     HDL Cholesterol: 59.6 mg/dL     Total Cholesterol: 253 mg/dL       Relevant Orders   POCT HgB A1C   CBC with Differential/Platelet   Comprehensive metabolic panel   Lipid panel     Patient Instructions  Hypertension, Adult High blood pressure (hypertension) is when the force of blood pumping through the arteries is too strong. The arteries are the blood vessels that carry blood from the heart throughout the body. Hypertension forces the heart to work harder to pump blood and may cause arteries to become narrow or stiff. Untreated or uncontrolled hypertension can lead to a heart attack, heart failure, a stroke, kidney disease, and other problems. A blood pressure reading consists of a higher number over a lower number. Ideally, your blood pressure should be below 120/80. The first ("top") number is called the systolic pressure. It is a measure of the pressure in your arteries as your heart beats. The second ("bottom") number is called the diastolic pressure. It is a measure of the pressure in your arteries as the heart relaxes. What are the causes? The exact cause of this condition is not known. There are some conditions that result in high blood pressure. What increases the risk? Certain factors may make you more likely to develop high blood pressure. Some of these risk factors are under your control, including: Smoking. Not getting enough exercise or physical activity. Being overweight. Having too much fat, sugar, calories, or salt (sodium)  in your diet. Drinking too much alcohol. Other risk factors include: Having a personal history of heart disease, diabetes, high cholesterol, or kidney disease. Stress. Having a family history of high blood pressure and high cholesterol. Having obstructive sleep apnea. Age. The risk increases with age. What are the signs or symptoms? High blood pressure may not cause symptoms. Very high blood pressure (hypertensive crisis) may cause: Headache. Fast or irregular heartbeats (palpitations). Shortness of breath. Nosebleed. Nausea and vomiting. Vision changes. Severe chest pain, dizziness, and seizures. How is this diagnosed? This condition is diagnosed by measuring your blood pressure while you are seated, with your arm resting on a flat surface, your legs uncrossed, and your feet flat on the floor. The cuff of the blood pressure monitor will be placed directly against the skin of your upper arm at the level of your heart. Blood pressure should be measured at least twice using the same arm. Certain conditions can cause a difference in blood pressure between your right and left arms. If you have a high blood pressure reading during one visit or you have normal blood pressure with other risk factors, you may be asked to: Return on a different day to have your blood pressure checked again. Monitor your blood pressure at home for 1 week or longer. If you are diagnosed with hypertension, you may have other blood or imaging tests to help your health care provider understand your overall risk for other conditions. How is this treated? This condition is treated by making healthy lifestyle changes, such as eating healthy foods, exercising more, and reducing your alcohol intake. You may be referred for counseling on a healthy diet and physical activity. Your health care provider may prescribe medicine if lifestyle changes are not enough to get your blood pressure under control and if: Your systolic blood  pressure is above 130. Your diastolic blood pressure is above 80. Your personal target blood pressure may vary depending on your medical conditions, your age, and other factors. Follow these instructions at home: Eating and drinking  Eat a diet that is high in fiber and potassium, and low in sodium, added sugar, and fat. An example of this eating plan is called the DASH diet. DASH stands for Dietary Approaches to Stop Hypertension. To eat this way:  Eat plenty of fresh fruits and vegetables. Try to fill one half of your plate at each meal with fruits and vegetables. Eat whole grains, such as whole-wheat pasta, brown rice, or whole-grain bread. Fill about one fourth of your plate with whole grains. Eat or drink low-fat dairy products, such as skim milk or low-fat yogurt. Avoid fatty cuts of meat, processed or cured meats, and poultry with skin. Fill about one fourth of your plate with lean proteins, such as fish, chicken without skin, beans, eggs, or tofu. Avoid pre-made and processed foods. These tend to be higher in sodium, added sugar, and fat. Reduce your daily sodium intake. Many people with hypertension should eat less than 1,500 mg of sodium a day. Do not drink alcohol if: Your health care provider tells you not to drink. You are pregnant, may be pregnant, or are planning to become pregnant. If you drink alcohol: Limit how much you have to: 0-1 drink a day for women. 0-2 drinks a day for men. Know how much alcohol is in your drink. In the [place] one drink equals one 12 oz bottle of beer (355 mL), one 5 oz glass of wine (148 mL), or one 1 oz glass of hard liquor (44 mL). Lifestyle  Work with your health care provider to maintain a healthy body weight or to lose weight. Ask what an ideal weight is for you. Get at least 30 minutes of exercise that causes your heart to beat faster (aerobic exercise) most days of the week. Activities may include walking, swimming, or biking. Include exercise  to strengthen your muscles (resistance exercise), such as Pilates or lifting weights, as part of your weekly exercise routine. Try to do these types of exercises for 30 minutes at least 3 days a week. Do not use any products that contain nicotine or tobacco. These products include cigarettes, chewing tobacco, and vaping devices, such as e-cigarettes. If you need help quitting, ask your health care provider. Monitor your blood pressure at home as told by your health care provider. Keep all follow-up visits. This is important. Medicines Take over-the-counter and prescription medicines only as told by your health care provider. Follow directions carefully. Blood pressure medicines must be taken as prescribed. Do not skip doses of blood pressure medicine. Doing this puts you at risk for problems and can make the medicine less effective. Ask your health care provider about side effects or reactions to medicines that you should watch for. Contact a health care provider if you: Think you are having a reaction to a medicine you are taking. Have headaches that keep coming back (recurring). Feel dizzy. Have swelling in your ankles. Have trouble with your vision. Get help right away if you: Develop a severe headache or confusion. Have unusual weakness or numbness. Feel faint. Have severe pain in your chest or abdomen. Vomit repeatedly. Have trouble breathing. These symptoms may be an emergency. Get help right away. Call 911. Do not wait to see if the symptoms will go away. Do not drive yourself to the hospital. Summary Hypertension is when the force of blood pumping through your arteries is too strong. If this condition is not controlled, it may put you at risk for serious complications. Your personal target blood pressure may vary depending on your medical conditions, your age, and other factors. For most people, a normal blood pressure is less than 120/80. Hypertension is treated with lifestyle  changes, medicines, or a combination of both. Lifestyle changes include losing weight, eating a  healthy, low-sodium diet, exercising more, and limiting alcohol. This information is not intended to replace advice given to you by your health care provider. Make sure you discuss any questions you have with your health care provider. Document Revised: 09/04/2021 Document Reviewed: 09/04/2021 Elsevier Patient Education  2024 Elsevier Inc.      Edwina Barth, MD [facility]

## 2023-08-13 NOTE — Assessment & Plan Note (Signed)
Chronic stable conditions Diet and nutrition discussed Continue rosuvastatin 10 mg daily The 10-year ASCVD risk score (Arnett DK, et al., 2019) is: 15.3%   Values used to calculate the score:     Age: 52 years     Sex: Male     Is Non-Hispanic African American: No     Diabetic: Yes     Tobacco smoker: No     Systolic Blood Pressure: 144 mmHg     Is BP treated: Yes     HDL Cholesterol: 59.6 mg/dL     Total Cholesterol: 253 mg/dL

## 2023-08-13 NOTE — Assessment & Plan Note (Signed)
Elevated blood pressure readings in the office but normal at home Continue valsartan 80 mg daily and amlodipine 10 mg daily Advised to continue monitoring blood pressure readings at home daily for the next several weeks and keep a log.  Advised to contact the office if numbers persistently elevated. Well-controlled diabetes off medications with hemoglobin A1c at 6.1 Cardiovascular risks associated with hypertension and diabetes discussed Diet and nutrition discussed Follow-up in 6 months

## 2023-08-13 NOTE — Patient Instructions (Signed)
Hypertension, Adult High blood pressure (hypertension) is when the force of blood pumping through the arteries is too strong. The arteries are the blood vessels that carry blood from the heart throughout the body. Hypertension forces the heart to work harder to pump blood and may cause arteries to become narrow or stiff. Untreated or uncontrolled hypertension can lead to a heart attack, heart failure, a stroke, kidney disease, and other problems. A blood pressure reading consists of a higher number over a lower number. Ideally, your blood pressure should be below 120/80. The first ("top") number is called the systolic pressure. It is a measure of the pressure in your arteries as your heart beats. The second ("bottom") number is called the diastolic pressure. It is a measure of the pressure in your arteries as the heart relaxes. What are the causes? The exact cause of this condition is not known. There are some conditions that result in high blood pressure. What increases the risk? Certain factors may make you more likely to develop high blood pressure. Some of these risk factors are under your control, including: Smoking. Not getting enough exercise or physical activity. Being overweight. Having too much fat, sugar, calories, or salt (sodium) in your diet. Drinking too much alcohol. Other risk factors include: Having a personal history of heart disease, diabetes, high cholesterol, or kidney disease. Stress. Having a family history of high blood pressure and high cholesterol. Having obstructive sleep apnea. Age. The risk increases with age. What are the signs or symptoms? High blood pressure may not cause symptoms. Very high blood pressure (hypertensive crisis) may cause: Headache. Fast or irregular heartbeats (palpitations). Shortness of breath. Nosebleed. Nausea and vomiting. Vision changes. Severe chest pain, dizziness, and seizures. How is this diagnosed? This condition is diagnosed by  measuring your blood pressure while you are seated, with your arm resting on a flat surface, your legs uncrossed, and your feet flat on the floor. The cuff of the blood pressure monitor will be placed directly against the skin of your upper arm at the level of your heart. Blood pressure should be measured at least twice using the same arm. Certain conditions can cause a difference in blood pressure between your right and left arms. If you have a high blood pressure reading during one visit or you have normal blood pressure with other risk factors, you may be asked to: Return on a different day to have your blood pressure checked again. Monitor your blood pressure at home for 1 week or longer. If you are diagnosed with hypertension, you may have other blood or imaging tests to help your health care provider understand your overall risk for other conditions. How is this treated? This condition is treated by making healthy lifestyle changes, such as eating healthy foods, exercising more, and reducing your alcohol intake. You may be referred for counseling on a healthy diet and physical activity. Your health care provider may prescribe medicine if lifestyle changes are not enough to get your blood pressure under control and if: Your systolic blood pressure is above 130. Your diastolic blood pressure is above 80. Your personal target blood pressure may vary depending on your medical conditions, your age, and other factors. Follow these instructions at home: Eating and drinking  Eat a diet that is high in fiber and potassium, and low in sodium, added sugar, and fat. An example of this eating plan is called the DASH diet. DASH stands for Dietary Approaches to Stop Hypertension. To eat this way: Eat   plenty of fresh fruits and vegetables. Try to fill one half of your plate at each meal with fruits and vegetables. Eat whole grains, such as whole-wheat pasta, brown rice, or whole-grain bread. Fill about one  fourth of your plate with whole grains. Eat or drink low-fat dairy products, such as skim milk or low-fat yogurt. Avoid fatty cuts of meat, processed or cured meats, and poultry with skin. Fill about one fourth of your plate with lean proteins, such as fish, chicken without skin, beans, eggs, or tofu. Avoid pre-made and processed foods. These tend to be higher in sodium, added sugar, and fat. Reduce your daily sodium intake. Many people with hypertension should eat less than 1,500 mg of sodium a day. Do not drink alcohol if: Your health care provider tells you not to drink. You are pregnant, may be pregnant, or are planning to become pregnant. If you drink alcohol: Limit how much you have to: 0-1 drink a day for women. 0-2 drinks a day for men. Know how much alcohol is in your drink. In the [place] one drink equals one 12 oz bottle of beer (355 mL), one 5 oz glass of wine (148 mL), or one 1 oz glass of hard liquor (44 mL). Lifestyle  Work with your health care provider to maintain a healthy body weight or to lose weight. Ask what an ideal weight is for you. Get at least 30 minutes of exercise that causes your heart to beat faster (aerobic exercise) most days of the week. Activities may include walking, swimming, or biking. Include exercise to strengthen your muscles (resistance exercise), such as Pilates or lifting weights, as part of your weekly exercise routine. Try to do these types of exercises for 30 minutes at least 3 days a week. Do not use any products that contain nicotine or tobacco. These products include cigarettes, chewing tobacco, and vaping devices, such as e-cigarettes. If you need help quitting, ask your health care provider. Monitor your blood pressure at home as told by your health care provider. Keep all follow-up visits. This is important. Medicines Take over-the-counter and prescription medicines only as told by your health care provider. Follow directions carefully. Blood  pressure medicines must be taken as prescribed. Do not skip doses of blood pressure medicine. Doing this puts you at risk for problems and can make the medicine less effective. Ask your health care provider about side effects or reactions to medicines that you should watch for. Contact a health care provider if you: Think you are having a reaction to a medicine you are taking. Have headaches that keep coming back (recurring). Feel dizzy. Have swelling in your ankles. Have trouble with your vision. Get help right away if you: Develop a severe headache or confusion. Have unusual weakness or numbness. Feel faint. Have severe pain in your chest or abdomen. Vomit repeatedly. Have trouble breathing. These symptoms may be an emergency. Get help right away. Call 911. Do not wait to see if the symptoms will go away. Do not drive yourself to the hospital. Summary Hypertension is when the force of blood pumping through your arteries is too strong. If this condition is not controlled, it may put you at risk for serious complications. Your personal target blood pressure may vary depending on your medical conditions, your age, and other factors. For most people, a normal blood pressure is less than 120/80. Hypertension is treated with lifestyle changes, medicines, or a combination of both. Lifestyle changes include losing weight, eating a healthy,   low-sodium diet, exercising more, and limiting alcohol. This information is not intended to replace advice given to you by your health care provider. Make sure you discuss any questions you have with your health care provider. Document Revised: 09/04/2021 Document Reviewed: 09/04/2021 Elsevier Patient Education  2024 Elsevier Inc.  

## 2023-08-13 NOTE — Assessment & Plan Note (Signed)
Seen by cardiologist earlier today.  Stable chronic condition. No recent anginal episodes.  No concerns. Well-controlled hypertension Continues daily baby aspirin

## 2023-08-13 NOTE — Patient Instructions (Addendum)
Medication Instructions:  Restart Rosuvastatin 10mg   every day Refill sent to your Pharmacy Continue all other medications  *If you need a refill on your cardiac medications before your next appointment, please call your pharmacy*   Lab Work: Lipid Panel  have done in 2 months  If you have labs (blood work) drawn today and your tests are completely normal, you will receive your results only by: MyChart Message (if you have MyChart) OR A paper copy in the mail If you have any lab test that is abnormal or we need to change your treatment, we will call you to review the results.   Testing/Procedures: None  Follow-Up: At [facility], you and your health needs are our priority.  As part of our continuing mission to provide you with exceptional heart care, we have created designated Provider Care Teams.  These Care Teams include your primary Cardiologist (physician) and Advanced Practice Providers (APPs -  Physician Assistants and Nurse Practitioners) who all work together to provide you with the care you need, when you need it.  We recommend signing up for the patient portal called "MyChart".  Sign up information is provided on this After Visit Summary.  MyChart is used to connect with patients for Virtual Visits (Telemedicine).  Patients are able to view lab/test results, encounter notes, upcoming appointments, etc.  Non-urgent messages can be sent to your provider as well.   To learn more about what you can do with MyChart, go to [email or web address].    Your next appointment:    6 months  call in Jan. 2025 to make April appt  Provider:    Dr. Bjorn Pippin

## 2023-09-26 ENCOUNTER — Other Ambulatory Visit: Payer: Self-pay

## 2023-09-26 DIAGNOSIS — E1169 Type 2 diabetes mellitus with other specified complication: Secondary | ICD-10-CM

## 2023-12-26 ENCOUNTER — Encounter: Payer: 59 | Attending: Registered Nurse | Admitting: Registered Nurse

## 2023-12-26 VITALS — BP 135/89 | HR 116 | Ht 74.0 in | Wt 263.0 lb

## 2023-12-26 DIAGNOSIS — M5416 Radiculopathy, lumbar region: Secondary | ICD-10-CM | POA: Diagnosis not present

## 2023-12-26 DIAGNOSIS — W19XXXD Unspecified fall, subsequent encounter: Secondary | ICD-10-CM | POA: Insufficient documentation

## 2023-12-26 DIAGNOSIS — Y92009 Unspecified place in unspecified non-institutional (private) residence as the place of occurrence of the external cause: Secondary | ICD-10-CM

## 2023-12-26 DIAGNOSIS — M792 Neuralgia and neuritis, unspecified: Secondary | ICD-10-CM | POA: Diagnosis present

## 2023-12-26 DIAGNOSIS — G894 Chronic pain syndrome: Secondary | ICD-10-CM | POA: Insufficient documentation

## 2023-12-26 DIAGNOSIS — R000 Tachycardia, unspecified: Secondary | ICD-10-CM | POA: Insufficient documentation

## 2023-12-26 MED ORDER — GABAPENTIN 400 MG PO CAPS
800.0000 mg | ORAL_CAPSULE | Freq: Every day | ORAL | 5 refills | Status: DC
Start: 2023-12-26 — End: 2024-06-22

## 2023-12-26 NOTE — Progress Notes (Signed)
Subjective:    Patient ID: Ernest Williams, male    DOB: [birth year], 53 y.o.   MRN: [id number]  HPI: Ernest Williams is a 53 y.o. male who returns for follow up appointment for chronic pain and medication refill. He states his pain is located in his lower back radiating into his left lower extremity and left foot. He rates his pain 5. His current exercise regime is walking and performing stretching exercises.  Ernest Williams states on Tuesday he was walking in his home, when his left lower extremity gave out and he fell on his left side. He was able to pick himself up, he didn't seek medical attention. Educated on falls prevention, he verbalizes understanding.   Ernest Williams asked about Ketamine Therapy, spoke with Dr Wynn Banker.  [facility] : Dr Roderic Ovens or Dr Marca Ancona, sent Ernest Williams a My-Chart message regarding the Ketamine Therapy.     Pain Inventory Average Pain 5 Pain Right Now 5 My pain is sharp, burning, dull, stabbing, tingling, and aching  In the last 24 hours, has pain interfered with the following? General activity 0 Relation with others 0 Enjoyment of life 0 What TIME of day is your pain at its worst? varies Sleep (in general) Poor  Pain is worse with: walking, bending, sitting, standing, and some activites Pain improves with:  gabapentin Relief from Meds: 5  Family History  Problem Relation Age of Onset   Diabetes Mother    Healthy Father    Social History   Socioeconomic History   Marital status: Single    Spouse name: Not on file   Number of children: Not on file   Years of education: Not on file   Highest education level: Associate degree: occupational, Scientist, product/process development, or vocational program  Occupational History   Not on file  Tobacco Use   Smoking status: Former    Current packs/day: 0.00    Types: Cigarettes    Start date: 2007    Quit date: 2017    Years since quitting: 8.1   Smokeless tobacco: Never  Vaping Use   Vaping status: Former    Substances: CBD   Devices: vapeppresso  Substance and Sexual Activity   Alcohol use: Not Currently    Comment: occasional   Drug use: Yes    Types: Marijuana    Comment: 03-30-2020 per pt smokes average 2-3g per week for neuropathy   Sexual activity: Not Currently  Other Topics Concern   Not on file  Social History Narrative   Not on file   Social Drivers of Health   Financial Resource Strain: Medium Risk (04/09/2023)   Overall Financial Resource Strain (CARDIA)    Difficulty of Paying Living Expenses: Somewhat hard  Food Insecurity: Unknown (04/09/2023)   Hunger Vital Sign    Worried About Running Out of Food in the Last Year: Patient declined    Ran Out of Food in the Last Year: Never true  Transportation Needs: No Transportation Needs (04/09/2023)   PRAPARE - Administrator, Civil Service (Medical): No    Lack of Transportation (Non-Medical): No  Physical Activity: Insufficiently Active (04/09/2023)   Exercise Vital Sign    Days of Exercise per Week: 3 days    Minutes of Exercise per Session: 30 min  Stress: Not on file  Social Connections: Unknown (04/09/2023)   Social Connection and Isolation Panel [NHANES]    Frequency of Communication with Friends and Family: Once a week    Frequency of Social  Gatherings with Friends and Family: Once a week    Attends Religious Services: Patient declined    Active Member of Clubs or Organizations: No    Attends Engineer, structural: Not on file    Marital Status: Never married   Past Surgical History:  Procedure Laterality Date   NO PAST SURGERIES     ORIF WRIST FRACTURE Left 03/31/2020   Procedure: OPEN REDUCTION INTERNAL FIXATION (ORIF) WRIST FRACTURE;  Surgeon: Sheral Apley, MD;  Location: [facility] [place];  Service: Orthopedics;  Laterality: Left;   Past Surgical History:  Procedure Laterality Date   NO PAST SURGERIES     ORIF WRIST FRACTURE Left 03/31/2020   Procedure: OPEN REDUCTION INTERNAL  FIXATION (ORIF) WRIST FRACTURE;  Surgeon: Sheral Apley, MD;  Location: [facility];  Service: Orthopedics;  Laterality: Left;   Past Medical History:  Diagnosis Date   Degeneration of spine    Family history of adverse reaction to anesthesia    mother--- ponv   History of concussion    09/ 2013  no loc,  no residual   Hypertension    goes to urgent care , no regular pcp,  (stress echo 12-21-2009 in epic, normal)   Peripheral neuropathy    per pt states told due to degenerative spine   Wears glasses    Wrist fracture, closed, left, initial encounter 03/25/2020   There were no vitals taken for this visit.  Opioid Risk Score:   Fall Risk Score:  `1  Depression screen PHQ 2/9     08/13/2023    1:29 PM 06/24/2023    2:45 PM 04/09/2023    1:10 PM 10/10/2022   10:09 AM 10/22/2021    9:39 AM 06/21/2021    9:55 AM 05/11/2021    2:30 PM  Depression screen PHQ 2/9  Decreased Interest 0 0 0 0 0 0 0  Down, Depressed, Hopeless 0 0 0 0 0 0 0  PHQ - 2 Score 0 0 0 0 0 0 0  Altered sleeping       0  Tired, decreased energy       0  Change in appetite       0  Feeling bad or failure about yourself        0  Trouble concentrating       0  Moving slowly or fidgety/restless       0  Suicidal thoughts       0  PHQ-9 Score       0     Review of Systems  Musculoskeletal:  Positive for back pain, gait problem and neck pain.  All other systems reviewed and are negative.     Objective:   Physical Exam Vitals and nursing note reviewed.  Constitutional:      Appearance: Normal appearance.  Cardiovascular:     Rate and Rhythm: Normal rate and regular rhythm.     Pulses: Normal pulses.     Heart sounds: Normal heart sounds.  Pulmonary:     Effort: Pulmonary effort is normal.     Breath sounds: Normal breath sounds.  Musculoskeletal:     Comments: Normal Muscle Bulk and Muscle Testing Reveals:  Upper Extremities: Right: Full ROM and Muscle Strength 5/5 Left Upper  Extremity: Decreased ROM 90 Degrees and Muscle Strength 5/5 Left AC Joint Tenderness Lumbar Paraspinal Tenderness: L-4-L-5 Lower Extremities: Full ROM and Muscle Strength 5/5 Arises from Table with Ease Narrow Based  Gait     Skin:    General: Skin is warm and dry.  Neurological:     Mental Status: He is alert and oriented to person, place, and time.  Psychiatric:        Mood and Affect: Mood normal.        Behavior: Behavior normal.         Assessment & Plan:  Left Lumbar Radiculitis: Continue Gabapentin. Continue HEP  as Tolerated. Continue to Monitor.  Neuropathic Pain, Left Foot: Continue Gabapentin. Continue to Monitor.  Fall at [facility] subsequent Encounter. Educated on [employer]. He verbalizes understanding.  Chronic Pain: Continue Gabapentin 400 mg two capsules at bedtime #60. Marland Kitchen Continue to Monitor.  5. Tachycardia: Patient arrived to office with Tachycardia: He reports Cardiology following. Continue to monitor.  F/U in 6 months

## 2024-01-09 ENCOUNTER — Ambulatory Visit (HOSPITAL_COMMUNITY)
Admission: EM | Admit: 2024-01-09 | Discharge: 2024-01-09 | Disposition: A | Discharge status: Home / Self Care | Payer: 59 | Attending: Family Medicine | Admitting: Family Medicine

## 2024-01-09 ENCOUNTER — Encounter (HOSPITAL_COMMUNITY): Payer: Self-pay

## 2024-01-09 DIAGNOSIS — S76212A Strain of adductor muscle, fascia and tendon of left thigh, initial encounter: Secondary | ICD-10-CM

## 2024-01-09 DIAGNOSIS — S39012A Strain of muscle, fascia and tendon of lower back, initial encounter: Secondary | ICD-10-CM

## 2024-01-09 DIAGNOSIS — M25512 Pain in left shoulder: Secondary | ICD-10-CM

## 2024-01-09 MED ORDER — METHOCARBAMOL 500 MG PO TABS: 500.0000 mg | ORAL_TABLET | Freq: Two times a day (BID) | ORAL | 0 refills | Status: AC

## 2024-01-09 NOTE — ED Provider Notes (Signed)
 [facility]    CSN: [id number] Arrival date & time: 01/09/24  [id number]      History   Chief Complaint Chief Complaint  Patient presents with   Fall   Back Pain   Shoulder Pain    HPI Ernest Williams is a 53 y.o. male.   Patient presents with low back, left shoulder, and upper left leg pain described as tightness and spasms since slipping and falling flat on his back 2 weeks ago.  Denies hitting his head or LOC.  Denies numbness, tingling, and weakness.  Patient reports he has taken his prescribed gabapentin and Tylenol with no relief.   Fall  Back Pain Shoulder Pain Associated symptoms: back pain     Past Medical History:  Diagnosis Date   Degeneration of spine    Family history of adverse reaction to anesthesia    mother--- ponv   History of concussion    09/ 2013  no loc,  no residual   Hypertension    goes to urgent care , no regular pcp,  (stress echo 12-21-2009 in epic, normal)   Peripheral neuropathy    per pt states told due to degenerative spine   Wears glasses    Wrist fracture, closed, left, initial encounter 03/25/2020    Patient Active Problem List   Diagnosis Date Noted   Dyslipidemia associated with type 2 diabetes mellitus (HCC) 12/04/2021   Dyslipidemia 12/18/2020   CAD (coronary artery disease) 12/18/2020   Hypertension associated with diabetes (HCC) 12/18/2020    Past Surgical History:  Procedure Laterality Date   NO PAST SURGERIES     ORIF WRIST FRACTURE Left 03/31/2020   Procedure: OPEN REDUCTION INTERNAL FIXATION (ORIF) WRIST FRACTURE;  Surgeon: Sheral Apley, MD;  Location: [facility] ;  Service: Orthopedics;  Laterality: Left;       Home Medications    Prior to Admission medications   Medication Sig Start Date End Date Taking? Authorizing Provider  anastrozole (ARIMIDEX) 1 MG tablet Take 1 mg by mouth 3 (three) times a week. 07/08/23  Yes [provider]  Ascorbic Acid (VITAMIN C PO) Take by  mouth daily.   Yes [provider]  aspirin EC 81 MG tablet Take 81 mg by mouth daily. 12/19/20  Yes [provider]  Cholecalciferol (VITAMIN D3 PO) Take 1 tablet by mouth daily.   Yes [provider]  gabapentin (NEURONTIN) 400 MG capsule Take 2 capsules (800 mg total) by mouth at bedtime. 12/26/23  Yes Jones Bales, NP  methocarbamol (ROBAXIN) 500 MG tablet Take 1 tablet (500 mg total) by mouth 2 (two) times daily. 01/09/24  Yes Susann Givens, Jia Dottavio A, NP  rosuvastatin (CRESTOR) 10 MG tablet Take 1 tablet (10 mg total) by mouth daily. 08/13/23  Yes [name], MD  tadalafil (CIALIS) 5 MG tablet Take 1 tablet by mouth daily. 05/01/23  Yes [provider]  Testosterone 20.25 MG/ACT (1.62%) GEL SMARTSIG:2 Pump Topical Every Morning 02/13/23  Yes [provider]  valsartan (DIOVAN) 80 MG tablet Take 1 tablet (80 mg total) by mouth daily. 04/09/23  Yes Sagardia, Eilleen Kempf, MD  diphenhydrAMINE (BENADRYL) 25 MG tablet Take 25 mg by mouth daily as needed.    [provider]    Family History Family History  Problem Relation Age of Onset   Diabetes Mother    Healthy Father     Social History Social History   Tobacco Use   Smoking status: Former  Current packs/day: 0.00    Types: Cigarettes    Start date: 2007    Quit date: 2017    Years since quitting: 8.1   Smokeless tobacco: Never  Vaping Use   Vaping status: Former   Substances: CBD   Devices: vapeppresso  Substance Use Topics   Alcohol use: Not Currently    Comment: occasional   Drug use: Yes    Types: Marijuana    Comment: 03-30-2020 per pt smokes average 2-3g per week for neuropathy     Allergies   Fish allergy, Tomato, Pear, and Strawberry (diagnostic)   Review of Systems Review of Systems  Musculoskeletal:  Positive for back pain.   Per HPI  Physical Exam Triage Vital Signs ED Triage Vitals  Encounter Vitals Group     BP 01/09/24 0831 138/83      Systolic BP Percentile --      Diastolic BP Percentile --      Pulse Rate 01/09/24 0831 87     Resp 01/09/24 0831 18     Temp 01/09/24 0831 98.8 F (37.1 C)     Temp Source 01/09/24 0831 Oral     SpO2 01/09/24 0831 98 %     Weight 01/09/24 0830 250 lb (113.4 kg)     Height 01/09/24 0830 6\' 2"  (1.88 m)     Head Circumference --      Peak Flow --      Pain Score 01/09/24 0828 8     Pain Loc --      Pain Education --      Exclude from Growth Chart --    No data found.  Updated Vital Signs BP 138/83 (BP Location: Left Arm)   Pulse 87   Temp 98.8 F (37.1 C) (Oral)   Resp 18   Ht 6\' 2"  (1.88 m)   Wt 250 lb (113.4 kg)   SpO2 98%   BMI 32.10 kg/m   Visual Acuity Right Eye Distance:   Left Eye Distance:   Bilateral Distance:    Right Eye Near:   Left Eye Near:    Bilateral Near:     Physical Exam Vitals and nursing note reviewed.  Constitutional:      General: He is awake. He is not in acute distress.    Appearance: Normal appearance. He is well-developed and well-groomed. He is not ill-appearing.  Musculoskeletal:     Right shoulder: Normal.     Left shoulder: Tenderness present. No swelling, deformity or bony tenderness. Normal range of motion.     Cervical back: Normal.     Thoracic back: Normal.     Lumbar back: Tenderness present. No bony tenderness. Normal range of motion.     Left upper leg: Tenderness present. No bony tenderness.  Neurological:     Mental Status: He is alert.  Psychiatric:        Behavior: Behavior is cooperative.      UC Treatments / Results  Labs (all labs ordered are listed, but only abnormal results are displayed) Labs Reviewed - No data to display  EKG   Radiology No results found.  Procedures Procedures (including critical care time)  Medications Ordered in UC Medications - No data to display  Initial Impression / Assessment and Plan / UC Course  I have reviewed the triage vital signs and the nursing  notes.  Pertinent labs & imaging results that were available during my care of the patient were reviewed by me and considered in  my medical decision making (see chart for details).     Patient presented with low back, left shoulder, and upper left leg pain that he describes as tightness and spasms.  Patient reports he slipped and fell about 2 weeks ago falling flat on his back and has had pain since.  Upon assessment tenderness noted to left low back, left upper leg, and soft tissues surrounding left shoulder.  Without bony tenderness and decreased range of motion.  Patient endorses movement pain to left shoulder upon flexion.  No other significant findings upon exam.  Prescribed Robaxin as needed for muscle pain and spasms.  Discussed follow-up and return precautions. Final Clinical Impressions(s) / UC Diagnoses   Final diagnoses:  Lumbar strain, initial encounter  Acute pain of left shoulder  Inguinal strain, left, initial encounter     Discharge Instructions      I have prescribed Robaxin which is a muscle relaxer that you can take as needed for muscle pain and spasms.  This can make you drowsy so do not drive, work, or drink while taking.  Otherwise alternate between Tylenol and ibuprofen as needed for pain.  You can also try applying heat for pain.  I have attached [facility] that you can follow-up with if your pain persist.  Return here as needed.    ED Prescriptions     Medication Sig Dispense Auth. Provider   methocarbamol (ROBAXIN) 500 MG tablet Take 1 tablet (500 mg total) by mouth 2 (two) times daily. 20 tablet Wynonia Lawman A, NP      PDMP not reviewed this encounter.   Wynonia Lawman A, [place] 01/09/24 [phone]

## 2024-01-09 NOTE — Discharge Instructions (Addendum)
 I have prescribed Robaxin which is a muscle relaxer that you can take as needed for muscle pain and spasms.  This can make you drowsy so do not drive, work, or drink while taking.  Otherwise alternate between Tylenol and ibuprofen as needed for pain.  You can also try applying heat for pain.  I have attached [facility] that you can follow-up with if your pain persist.  Return here as needed.

## 2024-01-09 NOTE — ED Triage Notes (Signed)
 Patient presenting with Larey Seat lower back pain, Left sholder, Left leg. Unable to raise the entire left shoulder. Yesterday having weakness in the right leg now onset 2 weeks ago. Patient slipped on a slick floor and fell flat on his back. Prescriptions or OTC medications tried: Yes- Gabapentin, tylenol    with no relief

## 2024-05-01 ENCOUNTER — Other Ambulatory Visit: Payer: Self-pay | Admitting: Emergency Medicine

## 2024-05-01 DIAGNOSIS — E1159 Type 2 diabetes mellitus with other circulatory complications: Secondary | ICD-10-CM

## 2024-06-22 ENCOUNTER — Encounter: Payer: Self-pay | Admitting: Physical Medicine & Rehabilitation

## 2024-06-22 ENCOUNTER — Encounter: Payer: 59 | Attending: Physical Medicine & Rehabilitation | Admitting: Physical Medicine & Rehabilitation

## 2024-06-22 VITALS — BP 158/96 | HR 93 | Ht 74.0 in | Wt 264.0 lb

## 2024-06-22 DIAGNOSIS — M5416 Radiculopathy, lumbar region: Secondary | ICD-10-CM | POA: Insufficient documentation

## 2024-06-22 MED ORDER — PREGABALIN 150 MG PO CAPS: 150.0000 mg | ORAL_CAPSULE | Freq: Two times a day (BID) | ORAL | 5 refills | Status: AC

## 2024-06-22 NOTE — Patient Instructions (Signed)
 Pregabalin Capsules What is this medication? PREGABALIN (pre GAB a lin) treats nerve pain. It may also be used to prevent and control seizures in people with epilepsy. It works by calming overactive nerves in your body. This medicine may be used for other purposes; ask your health care provider or pharmacist if you have questions. COMMON BRAND NAME(S): Lyrica What should I tell my care team before I take this medication? They need to know if you have any of these conditions: Heart failure Kidney disease Lung disease Substance use disorder Suicidal thoughts, plans or attempt by you or a family member An unusual or allergic reaction to pregabalin, other medications, foods, dyes, or preservatives Pregnant or trying to get pregnant Breast-feeding How should I use this medication? Take this medication by mouth with water. Take it as directed on the prescription label at the same time every day. You can take it with or without food. If it upsets your stomach, take it with food. Keep taking it unless your care team tells you to stop. A special MedGuide will be given to you by the pharmacist with each prescription and refill. Be sure to read this information carefully each time. Talk to your care team about the use of this medication in children. While it may be prescribed for children as young as 1 month for selected conditions, precautions do apply. Overdosage: If you think you have taken too much of this medicine contact a poison control center or emergency room at once. NOTE: This medicine is only for you. Do not share this medicine with others. What if I miss a dose? If you miss a dose, take it as soon as you can. If it is almost time for your next dose, take only that dose. Do not take double or extra doses. What may interact with this medication? This medication may interact with the following: Alcohol Antihistamines for allergy, cough, and cold Certain medications for anxiety or  sleep Certain medications for blood pressure, heart disease Certain medications for depression like amitriptyline, fluoxetine, sertraline Certain medications for diabetes, like pioglitazone, rosiglitazone Certain medications for seizures like phenobarbital, primidone General anesthetics like halothane, isoflurane, methoxyflurane, propofol Medications that relax muscles for surgery Opioid medications for pain Phenothiazines like chlorpromazine, mesoridazine, prochlorperazine, thioridazine This list may not describe all possible interactions. Give your health care provider a list of all the medicines, herbs, non-prescription drugs, or dietary supplements you use. Also tell them if you smoke, drink alcohol, or use illegal drugs. Some items may interact with your medicine. What should I watch for while using this medication? Visit your care team for regular checks on your progress. Tell your care team if your symptoms do not start to get better or if they get worse. Do not suddenly stop taking this medication. You may develop a severe reaction. Your care team will tell you how much medication to take. If your care team wants you to stop the medication, the dose may be slowly lowered over time to avoid any side effects. This medication may affect your coordination, reaction time, or judgment. Do not drive or operate machinery until you know how this medication affects you. Sit up or stand slowly to reduce the risk of dizzy or fainting spells. Drinking alcohol with this medication can increase the risk of these side effects. If you or your family notice any changes in your behavior, such as new or worsening depression, thoughts of harming yourself, anxiety, other unusual or disturbing thoughts, or memory loss, call your  care team right away. Wear a medical ID bracelet or chain if you are taking this medication for seizures. Carry a card that describes your condition. List the medications and doses you take  on the card. This medication may make it more difficult to father a child. Talk to your care team if you are concerned about your fertility. What side effects may I notice from receiving this medication? Side effects that you should report to your care team as soon as possible: Allergic reactions or angioedema--skin rash, itching, hives, swelling of the face, eyes, lips, tongue, arms, or legs, trouble swallowing or breathing Blurry vision Thoughts of suicide or self-harm, worsening mood, feelings of depression Trouble breathing Side effects that usually do not require medical attention (report to your care team if they continue or are bothersome): Dizziness Drowsiness Dry mouth Nausea Swelling of the ankles, feet, hands Vomiting Weight gain This list may not describe all possible side effects. Call your doctor for medical advice about side effects. You may report side effects to FDA at 1-800-FDA-1088. Where should I keep my medication? Keep out of the reach of children and pets. This medication can be abused. Keep it in a safe place to protect it from theft. Do not share it with anyone. It is only for you. Selling or giving away this medication is dangerous and against the law. Store at [employer] C (77 degrees F). Get rid of any unused medication after the expiration date. This medication may cause harm and death if it is taken by other adults, children, or pets. It is important to get rid of the medication as soon as you no longer need it, or it is expired. You can do this in two ways: Take the medication to a medication take-back program. Check with your pharmacy or law enforcement to find a location. If you cannot return the medication, check the label or package insert to see if the medication should be thrown out in the garbage or flushed down the toilet. If you are not sure, ask your care team. If it is safe to put it in the trash, take the medication out of the container. Mix the  medication with cat litter, dirt, coffee grounds, or other unwanted substance. Seal the mixture in a bag or container. Put it in the trash. NOTE: This sheet is a summary. It may not cover all possible information. If you have questions about this medicine, talk to your doctor, pharmacist, or health care provider.  2024 Elsevier/Gold Standard (2021-07-31 00:00:00)

## 2024-06-22 NOTE — Progress Notes (Signed)
 Subjective:    Patient ID: Ernest Williams, male    DOB: 1971-09-02, 53 y.o.   MRN: [id number]  HPI Chief complaint is low back pain  Had a fall landed on back seen by Ortho at [facility], plans to see ortho for f/u , reports sustaining a fracture in his lumbar spine although I do not see any imaging studies. 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  He states that while this is helpful he would like to try something else that hopefully will give him some better relief.  We also discussed other treatment options including Qutenza  patch He has never tried pregabalin  we discussed that his sometimes difficult to convert patient from gabapentin  to pregabalin  in terms of managing of dosages.  Some adjustment may be needed. Has home gym including [employer],  Pain Inventory Average Pain 7 Pain Right Now 9 My pain is constant, sharp, burning, and stabbing  In the last 24 hours, has pain interfered with the following? General activity 6 Relation with others 6 Enjoyment of life 6 What TIME of day is your pain at its worst? morning , daytime, evening, and night Sleep (in general) Poor  Pain is worse with: walking, bending, and sitting Pain improves with: medication Relief from Meds: 3  Family History  Problem Relation Age of Onset   Diabetes Mother    Healthy Father    Social History   Socioeconomic History   Marital status: Single    Spouse name: Not on file   Number of children: Not on file   Years of education: Not on file   Highest education level: Associate degree: occupational, Scientist, product/process development, or vocational program  Occupational History   Not on file  Tobacco Use   Smoking status: Former    Current packs/day: 0.00    Types: Cigarettes    Start date: 2007    Quit date: 2017    Years since quitting: 8.6   Smokeless tobacco: Never  Vaping Use   Vaping status: Former   Substances: CBD   Devices: vapeppresso   Substance and Sexual Activity   Alcohol use: Not Currently    Comment: occasional   Drug use: Yes    Types: Marijuana    Comment: 03-30-2020 per pt smokes average 2-3g per week for neuropathy   Sexual activity: Not Currently  Other Topics Concern   Not on file  Social History Narrative   Not on file   Social Drivers of Health   Financial Resource Strain: Medium Risk (04/09/2023)   Overall Financial Resource Strain (CARDIA)    Difficulty of Paying Living Expenses: Somewhat hard  Food Insecurity: Unknown (04/09/2023)   Hunger Vital Sign    Worried About Running Out of Food in the Last Year: Patient declined    Ran Out of Food in the Last Year: Never true  Transportation Needs: No Transportation Needs (04/09/2023)   PRAPARE - Administrator, Civil Service (Medical): No    Lack of Transportation (Non-Medical): No  Physical Activity: Insufficiently Active (04/09/2023)   Exercise Vital Sign    Days of Exercise per Week: 3 days    Minutes of Exercise per Session: 30 min  Stress: Not on file  Social Connections: Unknown (04/09/2023)   Social Connection and Isolation Panel    Frequency of Communication with Friends and Family: Once a week    Frequency of Social Gatherings with Friends and Family: Once a week  Attends Religious Services: Patient declined    Active Member of Clubs or Organizations: No    Attends Banker Meetings: Not on file    Marital Status: Never married   Past Surgical History:  Procedure Laterality Date   NO PAST SURGERIES     ORIF WRIST FRACTURE Left 03/31/2020   Procedure: OPEN REDUCTION INTERNAL FIXATION (ORIF) WRIST FRACTURE;  Surgeon: Beverley Evalene BIRCH, MD;  Location: [facility];  Service: Orthopedics;  Laterality: Left;   Past Surgical History:  Procedure Laterality Date   NO PAST SURGERIES     ORIF WRIST FRACTURE Left 03/31/2020   Procedure: OPEN REDUCTION INTERNAL FIXATION (ORIF) WRIST FRACTURE;  Surgeon: Beverley Evalene BIRCH, MD;  Location: [facility];  Service: Orthopedics;  Laterality: Left;   Past Medical History:  Diagnosis Date   Degeneration of spine    Family history of adverse reaction to anesthesia    mother--- ponv   History of concussion    09/ 2013  no loc,  no residual   Hypertension    goes to urgent care , no regular pcp,  (stress echo 12-21-2009 in epic, normal)   Peripheral neuropathy    per pt states told due to degenerative spine   Wears glasses    Wrist fracture, closed, left, initial encounter 03/25/2020   BP (!) 158/96 (BP Location: Left Arm, Patient Position: Sitting, Cuff Size: Large) Comment: second BP reading  Pulse 93   Ht 6' 2 (1.88 m)   Wt 264 lb (119.7 kg)   SpO2 96%   BMI 33.90 kg/m   Opioid Risk Score:   Fall Risk Score:  `1  Depression screen PHQ 2/9     12/26/2023    3:24 PM 08/13/2023    1:29 PM 06/24/2023    2:45 PM 04/09/2023    1:10 PM 10/10/2022   10:09 AM 10/22/2021    9:39 AM 06/21/2021    9:55 AM  Depression screen PHQ 2/9  Decreased Interest 0 0 0 0 0 0 0  Down, Depressed, Hopeless 0 0 0 0 0 0 0  PHQ - 2 Score 0 0 0 0 0 0 0     Review of Systems  Musculoskeletal:  Positive for arthralgias, back pain and joint swelling.       Left shoulder, lower back pain, left knee and foot pain Muscle spasms left side and back  All other systems reviewed and are negative.      Objective:   Physical Exam General No acute distress mood affect mildly labile Motor strength is 5/5 bilateral deltoid bicep tricep grip as well as hip flexor knee extensor ankle dorsiflexor Ambulates without assistive device he does have an antalgic gait favors the right lower extremity. Upper extremity sensation is normal Reduced Left L4 and Left S1 dermatomal pinprick Lumbar spine has some tenderness palpation lumbar paraspinals bilaterally no step-off noted. Lumbar range of motion is diminished around 25% flexion extension lateral bending and  rotation.     Assessment & Plan:  1.  Chronic left lower extremity radicular pain plus minus diabetic neuropathy.  Will discontinue gabapentin  and start him on pregabalin  150 mg twice daily.  We discussed if it causes drowsiness during the day that he should only take it at night. 2.  Subacute to chronic low back pain he is now seeing [facility] for this.  He has a follow-up appointment planned.  They have his imaging studies and are planning to do an MRI.  He has had no progressive neurologic deficits or neurogenic bowel or bladder symptoms.  Physical medicine rehabilitation follow-up in 6 months

## 2024-06-30 ENCOUNTER — Other Ambulatory Visit: Payer: Self-pay | Admitting: Emergency Medicine

## 2024-06-30 DIAGNOSIS — E1159 Type 2 diabetes mellitus with other circulatory complications: Secondary | ICD-10-CM

## 2024-10-06 ENCOUNTER — Encounter (HOSPITAL_COMMUNITY): Payer: Self-pay | Admitting: *Deleted

## 2024-10-06 ENCOUNTER — Other Ambulatory Visit: Payer: Self-pay

## 2024-10-06 ENCOUNTER — Ambulatory Visit (INDEPENDENT_AMBULATORY_CARE_PROVIDER_SITE_OTHER)

## 2024-10-06 ENCOUNTER — Ambulatory Visit (HOSPITAL_COMMUNITY)
Admission: EM | Admit: 2024-10-06 | Discharge: 2024-10-06 | Disposition: A | Discharge status: Home / Self Care | Attending: Internal Medicine | Admitting: Internal Medicine

## 2024-10-06 DIAGNOSIS — R058 Other specified cough: Secondary | ICD-10-CM | POA: Diagnosis not present

## 2024-10-06 DIAGNOSIS — J40 Bronchitis, not specified as acute or chronic: Secondary | ICD-10-CM | POA: Diagnosis not present

## 2024-10-06 LAB — POCT INFLUENZA A/B
Influenza A, POC: NEGATIVE
Influenza B, POC: NEGATIVE

## 2024-10-06 LAB — POC SOFIA SARS ANTIGEN FIA: SARS Coronavirus 2 Ag: NEGATIVE

## 2024-10-06 MED ORDER — BENZONATATE 200 MG PO CAPS: 200.0000 mg | ORAL_CAPSULE | Freq: Three times a day (TID) | ORAL | 0 refills | Status: AC | PRN

## 2024-10-06 NOTE — ED Provider Notes (Signed)
 [facility]    CSN: [id number] Arrival date & time: 10/06/24  0801      History   Chief Complaint Chief Complaint  Patient presents with   Fever   Cough   Chills    HPI Ernest Williams is a 53 y.o. male who presents to urgent care endorsing a 5-day history of fever/chills and productive cough.  Symptoms began Saturday morning and have not improved.  He has tried several over-the-counter medications without significant symptom improvement.  He states that a coworker was recently out of work for 2 weeks with illness.  He describes a cough productive of cloudy yellow-colored sputum.  He states that he had diarrhea on Saturday but has not had any episodes of diarrhea since.  Denies nausea or vomiting.  He additionally endorses throat irritation.  Denies shortness of breath, chest pain, abdominal pain, and ear irritation.   Past Medical History:  Diagnosis Date   Degeneration of spine    Family history of adverse reaction to anesthesia    mother--- ponv   History of concussion    09/ 2013  no loc,  no residual   Hypertension    goes to urgent care , no regular pcp,  (stress echo 12-21-2009 in epic, normal)   Peripheral neuropathy    per pt states told due to degenerative spine   Wears glasses    Wrist fracture, closed, left, initial encounter 03/25/2020    Patient Active Problem List   Diagnosis Date Noted   Dyslipidemia associated with type 2 diabetes mellitus (HCC) 12/04/2021   Dyslipidemia 12/18/2020   CAD (coronary artery disease) 12/18/2020   Hypertension associated with diabetes (HCC) 12/18/2020    Past Surgical History:  Procedure Laterality Date   NO PAST SURGERIES     ORIF WRIST FRACTURE Left 03/31/2020   Procedure: OPEN REDUCTION INTERNAL FIXATION (ORIF) WRIST FRACTURE;  Surgeon: Beverley Evalene BIRCH, MD;  Location: [facility];  Service: Orthopedics;  Laterality: Left;       Home Medications    Prior to Admission medications    Medication Sig Start Date End Date Taking? Authorizing Provider  Ascorbic Acid (VITAMIN C PO) Take by mouth daily.   Yes [provider]  aspirin  EC 81 MG tablet Take 81 mg by mouth daily. 12/19/20  Yes [provider]  benzonatate  (TESSALON ) 200 MG capsule Take 1 capsule (200 mg total) by mouth 3 (three) times daily as needed for cough. 10/06/24  Yes Mansour Balboa E, MD  Cholecalciferol (VITAMIN D3 PO) Take 1 tablet by mouth daily.   Yes [provider]  diphenhydrAMINE (BENADRYL) 25 MG tablet Take 25 mg by mouth daily as needed.   Yes [provider]  pregabalin  (LYRICA ) 150 MG capsule Take 1 capsule (150 mg total) by mouth 2 (two) times daily. 06/22/24  Yes Kirsteins, Prentice BRAVO, MD  rosuvastatin  (CRESTOR ) 10 MG tablet Take 1 tablet (10 mg total) by mouth daily. 08/13/23  Yes Kate Lonni CROME, MD  Testosterone 20.25 MG/ACT (1.62%) GEL SMARTSIG:2 Pump Topical Every Morning 02/13/23  Yes [provider]  valsartan  (DIOVAN ) 80 MG tablet Take 1 tablet by mouth once daily 06/30/24  Yes Sagardia, Miguel Jose, MD  anastrozole (ARIMIDEX) 1 MG tablet Take 1 mg by mouth 3 (three) times a week. 07/08/23   [provider]  methocarbamol  (ROBAXIN ) 500 MG tablet Take 1 tablet (500 mg total) by mouth 2 (two) times daily. 01/09/24   Johnie Rumaldo LABOR, NP  tadalafil (  CIALIS) 5 MG tablet Take 1 tablet by mouth daily. 05/01/23   [provider]    Family History Family History  Problem Relation Age of Onset   Diabetes Mother    Healthy Father     Social History Social History   Tobacco Use   Smoking status: Former    Current packs/day: 0.00    Types: Cigarettes    Start date: 2007    Quit date: 2017    Years since quitting: 8.9   Smokeless tobacco: Never  Vaping Use   Vaping status: Former   Substances: CBD   Devices: vapeppresso  Substance Use Topics   Alcohol use: Not Currently    Comment: occasional   Drug use: Yes    Types:  Marijuana    Comment: 03-30-2020 per pt smokes average 2-3g per week for neuropathy     Allergies   Fish allergy, Tomato, Pear, and Strawberry (diagnostic)   Review of Systems Review of Systems  Constitutional:  Positive for appetite change, chills, fatigue and fever.  HENT:  Positive for congestion, rhinorrhea and sore throat. Negative for ear pain, facial swelling, sinus pressure, sinus pain and trouble swallowing.   Respiratory:  Positive for cough. Negative for shortness of breath.   Cardiovascular:  Negative for chest pain.  Gastrointestinal:  Negative for abdominal pain, blood in stool, diarrhea, nausea and vomiting.     Physical Exam Triage Vital Signs ED Triage Vitals  Encounter Vitals Group     BP 10/06/24 0823 (!) 157/105     Girls Systolic BP Percentile --      Girls Diastolic BP Percentile --      Boys Systolic BP Percentile --      Boys Diastolic BP Percentile --      Pulse Rate 10/06/24 0823 86     Resp 10/06/24 0823 20     Temp 10/06/24 0823 99 F (37.2 C)     Temp src --      SpO2 10/06/24 0823 93 %     Weight --      Height --      Head Circumference --      Peak Flow --      Pain Score 10/06/24 0820 7     Pain Loc --      Pain Education --      Exclude from Growth Chart --    No data found.  Updated Vital Signs BP (!) 157/105   Pulse 86   Temp 99 F (37.2 C)   Resp 20   SpO2 93%   Physical Exam Vitals reviewed.  Constitutional:      Appearance: He is ill-appearing.  HENT:     Head: Normocephalic and atraumatic.     Right Ear: Tympanic membrane, ear canal and external ear normal. There is no impacted cerumen.     Left Ear: Tympanic membrane, ear canal and external ear normal. There is no impacted cerumen.     Nose: Congestion and rhinorrhea present.     Mouth/Throat:     Mouth: Mucous membranes are moist.     Pharynx: Posterior oropharyngeal erythema present. No oropharyngeal exudate.  Eyes:     Extraocular Movements: Extraocular  movements intact.     Conjunctiva/sclera: Conjunctivae normal.     Pupils: Pupils are equal, round, and reactive to light.  Cardiovascular:     Rate and Rhythm: Normal rate and regular rhythm.     Pulses: Normal pulses.     Heart  sounds: Normal heart sounds. No murmur heard.    No friction rub. No gallop.  Pulmonary:     Effort: Pulmonary effort is normal.     Breath sounds: Normal breath sounds. No wheezing, rhonchi or rales.  Abdominal:     General: Abdomen is flat. Bowel sounds are normal. There is no distension.     Palpations: Abdomen is soft.     Tenderness: There is no abdominal tenderness.  Musculoskeletal:        General: Normal range of motion.     Cervical back: Normal range of motion.  Lymphadenopathy:     Cervical: No cervical adenopathy.  Skin:    General: Skin is warm and dry.  Neurological:     General: No focal deficit present.     Mental Status: He is alert and oriented to person, place, and time.     Motor: No weakness.    UC Treatments / Results  Labs (all labs ordered are listed, but only abnormal results are displayed) Labs Reviewed  POC SOFIA SARS ANTIGEN FIA  POCT INFLUENZA A/B    EKG   Radiology No results found.  Procedures Procedures (including critical care time)  Medications Ordered in UC Medications - No data to display  Initial Impression / Assessment and Plan / UC Course  I have reviewed the triage vital signs and the nursing notes.  Pertinent labs & imaging results that were available during my care of the patient were reviewed by me and considered in my medical decision making (see chart for details).    Patient is a 53 year old male presenting urgent care endorsing a 5-day history of cough with cloudy sputum production, subjective fever/chills, and fatigue.  He his exam is notable for sinus congestion, mild pharyngeal erythema, and cough with expiration.  Testing for COVID-19 and influenza was negative.  No focal consolidations  were noted on my interpretation of his chest x-ray.  Radiology review is still pending.  His history and exam findings seem most suggestive of acute bronchitis, likely viral in etiology.  Treatment options reviewed.  I largely recommended supportive care measures including rest, aggressive hydration, Tylenol , and OTC cough/cold medications.  I prescribed Tessalon  Perles to aid in cough relief.  He was instructed to return to care if symptoms worsen, sputum becomes dark yellow, green, or brown, and if he continues to fever.  He is in agreement with this plan and is medically stable for discharge at this time.  Final Clinical Impressions(s) / UC Diagnoses   Final diagnoses:  Respiratory tract congestion with cough  Bronchitis     Discharge Instructions      Testing for covid and flu was negative. There is not a consolidation on your chest xray that would be consistent with pneumonia. You likely have bronchitis. As we discussed, I recommend rest, hydration, tylenol , and cough/cold medication. I prescribed tessalon  perles for cough relief. Please return to urgent care if symptoms get worse, you continue to fever, or your sputum turns dark yellow, green, or brown.      ED Prescriptions     Medication Sig Dispense Auth. Provider   benzonatate  (TESSALON ) 200 MG capsule Take 1 capsule (200 mg total) by mouth 3 (three) times daily as needed for cough. 20 capsule Raissa Dam E, MD      PDMP not reviewed this encounter.   Melvenia Manus BRAVO, MD 10/06/24 [phone]

## 2024-10-06 NOTE — Discharge Instructions (Signed)
 Testing for covid and flu was negative. There is not a consolidation on your chest xray that would be consistent with pneumonia. You likely have bronchitis. As we discussed, I recommend rest, hydration, tylenol , and cough/cold medication. I prescribed tessalon  perles for cough relief. Please return to urgent care if symptoms get worse, you continue to fever, or your sputum turns dark yellow, green, or brown.

## 2024-10-06 NOTE — ED Triage Notes (Signed)
 PT reports since Friday he has had a sore throat,cough ,chills and fever. PT has been taking OTC with out relief.

## 2024-12-23 ENCOUNTER — Ambulatory Visit: Admitting: Physical Medicine & Rehabilitation
